# Patient Record
Sex: Male | Born: 1935 | Race: White | Hispanic: No | Marital: Married | Smoking: Former smoker
Health system: Southern US, Community
[De-identification: ages and names within clinical notes are randomized; demographics above are authoritative.]

## PROBLEM LIST (undated history)

## (undated) DIAGNOSIS — M069 Rheumatoid arthritis, unspecified: Secondary | ICD-10-CM

## (undated) DIAGNOSIS — I5030 Unspecified diastolic (congestive) heart failure: Secondary | ICD-10-CM

## (undated) DIAGNOSIS — K7460 Unspecified cirrhosis of liver: Secondary | ICD-10-CM

## (undated) DIAGNOSIS — J189 Pneumonia, unspecified organism: Secondary | ICD-10-CM

## (undated) DIAGNOSIS — K219 Gastro-esophageal reflux disease without esophagitis: Secondary | ICD-10-CM

## (undated) DIAGNOSIS — R0609 Other forms of dyspnea: Secondary | ICD-10-CM

## (undated) DIAGNOSIS — E039 Hypothyroidism, unspecified: Secondary | ICD-10-CM

## (undated) DIAGNOSIS — K429 Umbilical hernia without obstruction or gangrene: Secondary | ICD-10-CM

## (undated) DIAGNOSIS — J982 Interstitial emphysema: Secondary | ICD-10-CM

## (undated) DIAGNOSIS — I10 Essential (primary) hypertension: Secondary | ICD-10-CM

## (undated) DIAGNOSIS — E7800 Pure hypercholesterolemia, unspecified: Secondary | ICD-10-CM

## (undated) DIAGNOSIS — I2510 Atherosclerotic heart disease of native coronary artery without angina pectoris: Secondary | ICD-10-CM

## (undated) DIAGNOSIS — N189 Chronic kidney disease, unspecified: Secondary | ICD-10-CM

## (undated) HISTORY — PX: JOINT REPLACEMENT: SHX530

## (undated) HISTORY — PX: REVISION TOTAL HIP ARTHROPLASTY: SHX766

## (undated) HISTORY — PX: FOOT SURGERY: SHX648

## (undated) HISTORY — PX: TONSILLECTOMY: SUR1361

## (undated) HISTORY — PX: TOTAL HIP ARTHROPLASTY: SHX124

## (undated) HISTORY — PX: CATARACT EXTRACTION W/ INTRAOCULAR LENS  IMPLANT, BILATERAL: SHX1307

## (undated) HISTORY — PX: TOTAL SHOULDER ARTHROPLASTY: SHX126

## (undated) HISTORY — PX: CORONARY ANGIOPLASTY WITH STENT PLACEMENT: SHX49

---
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
## 1998-03-13 ENCOUNTER — Inpatient Hospital Stay (HOSPITAL_COMMUNITY): Admission: EM | Admit: 1998-03-13 | Discharge: 1998-03-15 | Payer: Self-pay | Admitting: Emergency Medicine

## 1998-08-18 HISTORY — PX: TOTAL KNEE ARTHROPLASTY: SHX125

## 1999-03-04 ENCOUNTER — Encounter: Payer: Self-pay | Admitting: Rheumatology

## 1999-03-04 ENCOUNTER — Inpatient Hospital Stay (HOSPITAL_COMMUNITY): Admission: EM | Admit: 1999-03-04 | Discharge: 1999-03-07 | Payer: Self-pay | Admitting: Emergency Medicine

## 1999-03-05 ENCOUNTER — Encounter: Payer: Self-pay | Admitting: Rheumatology

## 1999-03-19 ENCOUNTER — Ambulatory Visit (HOSPITAL_BASED_OUTPATIENT_CLINIC_OR_DEPARTMENT_OTHER): Admission: RE | Admit: 1999-03-19 | Discharge: 1999-03-19 | Payer: Self-pay | Admitting: Orthopaedic Surgery

## 1999-04-04 ENCOUNTER — Ambulatory Visit (HOSPITAL_BASED_OUTPATIENT_CLINIC_OR_DEPARTMENT_OTHER): Admission: RE | Admit: 1999-04-04 | Discharge: 1999-04-04 | Payer: Self-pay | Admitting: Orthopaedic Surgery

## 1999-05-23 ENCOUNTER — Encounter: Payer: Self-pay | Admitting: Orthopaedic Surgery

## 1999-05-28 ENCOUNTER — Inpatient Hospital Stay (HOSPITAL_COMMUNITY): Admission: RE | Admit: 1999-05-28 | Discharge: 1999-05-31 | Payer: Self-pay | Admitting: Orthopaedic Surgery

## 1999-05-31 ENCOUNTER — Inpatient Hospital Stay (HOSPITAL_COMMUNITY)
Admission: RE | Admit: 1999-05-31 | Discharge: 1999-06-04 | Payer: Self-pay | Admitting: Physical Medicine & Rehabilitation

## 1999-06-06 ENCOUNTER — Encounter
Admission: RE | Admit: 1999-06-06 | Discharge: 1999-06-27 | Payer: Self-pay | Admitting: Physical Medicine & Rehabilitation

## 1999-10-10 ENCOUNTER — Inpatient Hospital Stay (HOSPITAL_COMMUNITY): Admission: RE | Admit: 1999-10-10 | Discharge: 1999-10-14 | Payer: Self-pay | Admitting: Orthopaedic Surgery

## 1999-10-13 ENCOUNTER — Encounter: Payer: Self-pay | Admitting: Orthopedic Surgery

## 1999-10-16 ENCOUNTER — Encounter: Admission: RE | Admit: 1999-10-16 | Discharge: 1999-11-07 | Payer: Self-pay | Admitting: Orthopaedic Surgery

## 1999-10-24 ENCOUNTER — Encounter: Payer: Self-pay | Admitting: Orthopaedic Surgery

## 1999-10-24 ENCOUNTER — Inpatient Hospital Stay (HOSPITAL_COMMUNITY): Admission: EM | Admit: 1999-10-24 | Discharge: 1999-10-25 | Payer: Self-pay | Admitting: Emergency Medicine

## 1999-11-19 ENCOUNTER — Encounter: Payer: Self-pay | Admitting: Orthopedic Surgery

## 1999-11-19 ENCOUNTER — Inpatient Hospital Stay (HOSPITAL_COMMUNITY): Admission: EM | Admit: 1999-11-19 | Discharge: 1999-11-20 | Payer: Self-pay | Admitting: Emergency Medicine

## 1999-11-19 ENCOUNTER — Encounter: Payer: Self-pay | Admitting: Emergency Medicine

## 1999-12-19 ENCOUNTER — Inpatient Hospital Stay (HOSPITAL_COMMUNITY): Admission: EM | Admit: 1999-12-19 | Discharge: 1999-12-20 | Payer: Self-pay | Admitting: Emergency Medicine

## 1999-12-19 ENCOUNTER — Encounter: Payer: Self-pay | Admitting: Orthopedic Surgery

## 1999-12-24 ENCOUNTER — Encounter: Payer: Self-pay | Admitting: Orthopaedic Surgery

## 1999-12-24 ENCOUNTER — Inpatient Hospital Stay (HOSPITAL_COMMUNITY): Admission: RE | Admit: 1999-12-24 | Discharge: 1999-12-27 | Payer: Self-pay | Admitting: Orthopaedic Surgery

## 2000-04-13 ENCOUNTER — Inpatient Hospital Stay (HOSPITAL_COMMUNITY): Admission: RE | Admit: 2000-04-13 | Discharge: 2000-04-18 | Payer: Self-pay | Admitting: Orthopaedic Surgery

## 2000-04-20 ENCOUNTER — Emergency Department (HOSPITAL_COMMUNITY): Admission: EM | Admit: 2000-04-20 | Discharge: 2000-04-20 | Payer: Self-pay | Admitting: Emergency Medicine

## 2004-01-23 ENCOUNTER — Ambulatory Visit (HOSPITAL_BASED_OUTPATIENT_CLINIC_OR_DEPARTMENT_OTHER): Admission: RE | Admit: 2004-01-23 | Discharge: 2004-01-23 | Payer: Self-pay | Admitting: Orthopaedic Surgery

## 2004-01-23 ENCOUNTER — Ambulatory Visit (HOSPITAL_COMMUNITY): Admission: RE | Admit: 2004-01-23 | Discharge: 2004-01-23 | Payer: Self-pay | Admitting: Orthopaedic Surgery

## 2005-01-08 ENCOUNTER — Encounter: Admission: RE | Admit: 2005-01-08 | Discharge: 2005-01-08 | Payer: Self-pay | Admitting: Rheumatology

## 2006-04-24 ENCOUNTER — Ambulatory Visit: Payer: Self-pay | Admitting: Oncology

## 2006-07-03 ENCOUNTER — Ambulatory Visit: Payer: Self-pay | Admitting: Oncology

## 2006-07-07 LAB — CBC WITH DIFFERENTIAL/PLATELET
BASO%: 0.7 % (ref 0.0–2.0)
EOS%: 0.4 % (ref 0.0–7.0)
Eosinophils Absolute: 0 10*3/uL (ref 0.0–0.5)
MCH: 29 pg (ref 28.0–33.4)
MCHC: 33 g/dL (ref 32.0–35.9)
MCV: 87.7 fL (ref 81.6–98.0)
MONO%: 4.9 % (ref 0.0–13.0)
NEUT#: 7.5 10*3/uL — ABNORMAL HIGH (ref 1.5–6.5)
RBC: 5.05 10*6/uL (ref 4.20–5.71)
RDW: 13.5 % (ref 11.2–14.6)

## 2006-07-21 ENCOUNTER — Inpatient Hospital Stay (HOSPITAL_BASED_OUTPATIENT_CLINIC_OR_DEPARTMENT_OTHER): Admission: RE | Admit: 2006-07-21 | Discharge: 2006-07-21 | Payer: Self-pay | Admitting: Interventional Cardiology

## 2006-07-23 ENCOUNTER — Inpatient Hospital Stay (HOSPITAL_COMMUNITY): Admission: RE | Admit: 2006-07-23 | Discharge: 2006-07-24 | Payer: Self-pay | Admitting: Interventional Cardiology

## 2006-10-01 ENCOUNTER — Ambulatory Visit: Payer: Self-pay | Admitting: Oncology

## 2006-10-06 LAB — CBC WITH DIFFERENTIAL/PLATELET
Basophils Absolute: 0 10*3/uL (ref 0.0–0.1)
EOS%: 0.9 % (ref 0.0–7.0)
HCT: 42.5 % (ref 38.7–49.9)
HGB: 14.4 g/dL (ref 13.0–17.1)
LYMPH%: 9.1 % — ABNORMAL LOW (ref 14.0–48.0)
MCH: 29.6 pg (ref 28.0–33.4)
MCV: 87.5 fL (ref 81.6–98.0)
MONO%: 8.6 % (ref 0.0–13.0)
NEUT%: 81.3 % — ABNORMAL HIGH (ref 40.0–75.0)
Platelets: 59 10*3/uL — ABNORMAL LOW (ref 145–400)

## 2006-10-20 ENCOUNTER — Encounter (HOSPITAL_COMMUNITY): Payer: Self-pay | Admitting: Oncology

## 2006-10-20 ENCOUNTER — Ambulatory Visit: Payer: Self-pay | Admitting: Oncology

## 2006-10-20 ENCOUNTER — Ambulatory Visit (HOSPITAL_COMMUNITY): Admission: RE | Admit: 2006-10-20 | Discharge: 2006-10-20 | Payer: Self-pay | Admitting: Oncology

## 2006-11-05 LAB — CBC WITH DIFFERENTIAL/PLATELET
EOS%: 1.3 % (ref 0.0–7.0)
LYMPH%: 9.4 % — ABNORMAL LOW (ref 14.0–48.0)
MCH: 29.8 pg (ref 28.0–33.4)
MCV: 85.3 fL (ref 81.6–98.0)
MONO%: 5.6 % (ref 0.0–13.0)
RBC: 4.63 10*6/uL (ref 4.20–5.71)
RDW: 15.2 % — ABNORMAL HIGH (ref 11.2–14.6)

## 2006-11-05 LAB — COMPREHENSIVE METABOLIC PANEL
AST: 44 U/L — ABNORMAL HIGH (ref 0–37)
Albumin: 3.3 g/dL — ABNORMAL LOW (ref 3.5–5.2)
Alkaline Phosphatase: 65 U/L (ref 39–117)
BUN: 23 mg/dL (ref 6–23)
Potassium: 4.2 mEq/L (ref 3.5–5.3)
Sodium: 139 mEq/L (ref 135–145)
Total Bilirubin: 0.6 mg/dL (ref 0.3–1.2)
Total Protein: 6.5 g/dL (ref 6.0–8.3)

## 2006-11-12 LAB — CBC WITH DIFFERENTIAL/PLATELET
Basophils Absolute: 0 10*3/uL (ref 0.0–0.1)
Eosinophils Absolute: 0.1 10*3/uL (ref 0.0–0.5)
HCT: 41.6 % (ref 38.7–49.9)
LYMPH%: 20.3 % (ref 14.0–48.0)
MCV: 85.6 fL (ref 81.6–98.0)
MONO%: 11.1 % (ref 0.0–13.0)
NEUT#: 5.5 10*3/uL (ref 1.5–6.5)
NEUT%: 66.5 % (ref 40.0–75.0)
Platelets: 82 10*3/uL — ABNORMAL LOW (ref 145–400)
RBC: 4.86 10*6/uL (ref 4.20–5.71)

## 2006-11-16 ENCOUNTER — Ambulatory Visit: Payer: Self-pay | Admitting: Oncology

## 2006-11-19 LAB — CBC WITH DIFFERENTIAL/PLATELET
Basophils Absolute: 0.1 10*3/uL (ref 0.0–0.1)
Eosinophils Absolute: 0.1 10*3/uL (ref 0.0–0.5)
HCT: 41.1 % (ref 38.7–49.9)
LYMPH%: 15.5 % (ref 14.0–48.0)
MONO#: 0.9 10*3/uL (ref 0.1–0.9)
NEUT#: 6.7 10*3/uL — ABNORMAL HIGH (ref 1.5–6.5)
NEUT%: 72.4 % (ref 40.0–75.0)
Platelets: 50 10*3/uL — ABNORMAL LOW (ref 145–400)
WBC: 9.3 10*3/uL (ref 4.0–10.0)

## 2006-11-19 LAB — COMPREHENSIVE METABOLIC PANEL
CO2: 27 mEq/L (ref 19–32)
Creatinine, Ser: 1.2 mg/dL (ref 0.40–1.50)
Glucose, Bld: 84 mg/dL (ref 70–99)
Total Bilirubin: 0.7 mg/dL (ref 0.3–1.2)

## 2006-11-19 LAB — LACTATE DEHYDROGENASE: LDH: 310 U/L — ABNORMAL HIGH (ref 94–250)

## 2006-11-26 LAB — CBC WITH DIFFERENTIAL/PLATELET
BASO%: 0.7 % (ref 0.0–2.0)
Basophils Absolute: 0.1 10*3/uL (ref 0.0–0.1)
HCT: 42 % (ref 38.7–49.9)
HGB: 14.2 g/dL (ref 13.0–17.1)
MCHC: 33.8 g/dL (ref 32.0–35.9)
MONO#: 1 10*3/uL — ABNORMAL HIGH (ref 0.1–0.9)
NEUT%: 67 % (ref 40.0–75.0)
RDW: 15.9 % — ABNORMAL HIGH (ref 11.2–14.6)
WBC: 8.8 10*3/uL (ref 4.0–10.0)
lymph#: 1.7 10*3/uL (ref 0.9–3.3)

## 2006-12-03 LAB — CBC WITH DIFFERENTIAL/PLATELET
Basophils Absolute: 0.1 10*3/uL (ref 0.0–0.1)
EOS%: 1.5 % (ref 0.0–7.0)
Eosinophils Absolute: 0.1 10*3/uL (ref 0.0–0.5)
HCT: 38.6 % — ABNORMAL LOW (ref 38.7–49.9)
HGB: 13.1 g/dL (ref 13.0–17.1)
MCH: 29.2 pg (ref 28.0–33.4)
NEUT#: 5.7 10*3/uL (ref 1.5–6.5)
NEUT%: 68.7 % (ref 40.0–75.0)
lymph#: 1.5 10*3/uL (ref 0.9–3.3)

## 2006-12-10 LAB — COMPREHENSIVE METABOLIC PANEL
ALT: 17 U/L (ref 0–53)
AST: 23 U/L (ref 0–37)
CO2: 27 mEq/L (ref 19–32)
Chloride: 102 mEq/L (ref 96–112)
Sodium: 140 mEq/L (ref 135–145)
Total Bilirubin: 0.8 mg/dL (ref 0.3–1.2)
Total Protein: 6.9 g/dL (ref 6.0–8.3)

## 2006-12-10 LAB — CBC WITH DIFFERENTIAL/PLATELET
BASO%: 0.4 % (ref 0.0–2.0)
LYMPH%: 15.9 % (ref 14.0–48.0)
MCHC: 34.5 g/dL (ref 32.0–35.9)
MONO#: 0.8 10*3/uL (ref 0.1–0.9)
RBC: 4.71 10*6/uL (ref 4.20–5.71)
WBC: 8.5 10*3/uL (ref 4.0–10.0)
lymph#: 1.4 10*3/uL (ref 0.9–3.3)

## 2006-12-10 LAB — LACTATE DEHYDROGENASE: LDH: 246 U/L (ref 94–250)

## 2006-12-17 LAB — COMPREHENSIVE METABOLIC PANEL
ALT: 18 U/L (ref 0–53)
AST: 25 U/L (ref 0–37)
Alkaline Phosphatase: 59 U/L (ref 39–117)
BUN: 30 mg/dL — ABNORMAL HIGH (ref 6–23)
Creatinine, Ser: 1.05 mg/dL (ref 0.40–1.50)
Total Bilirubin: 0.7 mg/dL (ref 0.3–1.2)

## 2006-12-17 LAB — CBC WITH DIFFERENTIAL/PLATELET
BASO%: 0.2 % (ref 0.0–2.0)
EOS%: 0.3 % (ref 0.0–7.0)
HCT: 38.6 % — ABNORMAL LOW (ref 38.7–49.9)
LYMPH%: 6.5 % — ABNORMAL LOW (ref 14.0–48.0)
MCH: 28.9 pg (ref 28.0–33.4)
MCHC: 33.7 g/dL (ref 32.0–35.9)
MCV: 85.7 fL (ref 81.6–98.0)
MONO%: 3.3 % (ref 0.0–13.0)
NEUT%: 89.7 % — ABNORMAL HIGH (ref 40.0–75.0)
Platelets: 91 10*3/uL — ABNORMAL LOW (ref 145–400)
lymph#: 0.6 10*3/uL — ABNORMAL LOW (ref 0.9–3.3)

## 2007-01-14 ENCOUNTER — Ambulatory Visit: Payer: Self-pay | Admitting: Oncology

## 2007-02-17 LAB — CBC WITH DIFFERENTIAL/PLATELET
BASO%: 0.3 % (ref 0.0–2.0)
EOS%: 1.4 % (ref 0.0–7.0)
HGB: 12.4 g/dL — ABNORMAL LOW (ref 13.0–17.1)
MCH: 28.3 pg (ref 28.0–33.4)
MCHC: 34.1 g/dL (ref 32.0–35.9)
MONO#: 0.4 10*3/uL (ref 0.1–0.9)
RDW: 16.7 % — ABNORMAL HIGH (ref 11.2–14.6)
WBC: 10.6 10*3/uL — ABNORMAL HIGH (ref 4.0–10.0)
lymph#: 0.8 10*3/uL — ABNORMAL LOW (ref 0.9–3.3)

## 2007-03-18 ENCOUNTER — Ambulatory Visit: Payer: Self-pay | Admitting: Oncology

## 2007-03-22 LAB — CBC WITH DIFFERENTIAL/PLATELET
Basophils Absolute: 0 10*3/uL (ref 0.0–0.1)
Eosinophils Absolute: 0.2 10*3/uL (ref 0.0–0.5)
HCT: 39.3 % (ref 38.7–49.9)
HGB: 13.1 g/dL (ref 13.0–17.1)
MONO#: 0.8 10*3/uL (ref 0.1–0.9)
NEUT#: 5.6 10*3/uL (ref 1.5–6.5)
RDW: 17.4 % — ABNORMAL HIGH (ref 11.2–14.6)
lymph#: 1.4 10*3/uL (ref 0.9–3.3)

## 2007-06-10 ENCOUNTER — Observation Stay (HOSPITAL_COMMUNITY): Admission: EM | Admit: 2007-06-10 | Discharge: 2007-06-11 | Payer: Self-pay | Admitting: Emergency Medicine

## 2007-06-25 ENCOUNTER — Ambulatory Visit: Payer: Self-pay | Admitting: Oncology

## 2007-06-29 LAB — CBC WITH DIFFERENTIAL/PLATELET
Basophils Absolute: 0 10*3/uL (ref 0.0–0.1)
Eosinophils Absolute: 0.2 10*3/uL (ref 0.0–0.5)
HCT: 42.8 % (ref 38.7–49.9)
HGB: 14.5 g/dL (ref 13.0–17.1)
LYMPH%: 16.4 % (ref 14.0–48.0)
MCV: 84.3 fL (ref 81.6–98.0)
MONO%: 9.1 % (ref 0.0–13.0)
NEUT#: 7.2 10*3/uL — ABNORMAL HIGH (ref 1.5–6.5)
NEUT%: 72.2 % (ref 40.0–75.0)
Platelets: 102 10*3/uL — ABNORMAL LOW (ref 145–400)
RDW: 18.5 % — ABNORMAL HIGH (ref 11.2–14.6)

## 2007-06-29 LAB — COMPREHENSIVE METABOLIC PANEL
Albumin: 3.6 g/dL (ref 3.5–5.2)
Alkaline Phosphatase: 79 U/L (ref 39–117)
BUN: 30 mg/dL — ABNORMAL HIGH (ref 6–23)
Glucose, Bld: 81 mg/dL (ref 70–99)
Potassium: 3.8 mEq/L (ref 3.5–5.3)

## 2007-08-26 ENCOUNTER — Inpatient Hospital Stay (HOSPITAL_COMMUNITY): Admission: RE | Admit: 2007-08-26 | Discharge: 2007-08-28 | Payer: Self-pay | Admitting: Orthopaedic Surgery

## 2007-10-26 ENCOUNTER — Ambulatory Visit: Payer: Self-pay | Admitting: Oncology

## 2007-10-28 LAB — CBC WITH DIFFERENTIAL/PLATELET
Basophils Absolute: 0.1 10*3/uL (ref 0.0–0.1)
EOS%: 2.2 % (ref 0.0–7.0)
HGB: 13.9 g/dL (ref 13.0–17.1)
LYMPH%: 13.2 % — ABNORMAL LOW (ref 14.0–48.0)
MCH: 27.9 pg — ABNORMAL LOW (ref 28.0–33.4)
MCV: 83.8 fL (ref 81.6–98.0)
MONO%: 9.4 % (ref 0.0–13.0)
RBC: 4.98 10*6/uL (ref 4.20–5.71)
RDW: 17.6 % — ABNORMAL HIGH (ref 11.2–14.6)

## 2008-02-23 ENCOUNTER — Ambulatory Visit: Payer: Self-pay | Admitting: Oncology

## 2008-02-28 LAB — CBC WITH DIFFERENTIAL/PLATELET
BASO%: 0.4 % (ref 0.0–2.0)
EOS%: 1.9 % (ref 0.0–7.0)
MCH: 28.5 pg (ref 28.0–33.4)
MCHC: 33.5 g/dL (ref 32.0–35.9)
RDW: 17.8 % — ABNORMAL HIGH (ref 11.2–14.6)
lymph#: 1.4 10*3/uL (ref 0.9–3.3)

## 2008-02-28 LAB — COMPREHENSIVE METABOLIC PANEL
ALT: 18 U/L (ref 0–53)
AST: 24 U/L (ref 0–37)
Albumin: 3.5 g/dL (ref 3.5–5.2)
Calcium: 8.5 mg/dL (ref 8.4–10.5)
Chloride: 102 mEq/L (ref 96–112)
Potassium: 4.4 mEq/L (ref 3.5–5.3)

## 2008-06-06 ENCOUNTER — Ambulatory Visit: Payer: Self-pay | Admitting: Oncology

## 2008-06-08 LAB — CBC WITH DIFFERENTIAL/PLATELET
Basophils Absolute: 0 10*3/uL (ref 0.0–0.1)
HGB: 14.9 g/dL (ref 13.0–17.1)
MCH: 29.6 pg (ref 28.0–33.4)
MCHC: 33.2 g/dL (ref 32.0–35.9)
MCV: 89.2 fL (ref 81.6–98.0)
MONO#: 0.6 10*3/uL (ref 0.1–0.9)
MONO%: 8.8 % (ref 0.0–13.0)
RDW: 15.5 % — ABNORMAL HIGH (ref 11.2–14.6)
lymph#: 1.5 10*3/uL (ref 0.9–3.3)

## 2008-08-25 ENCOUNTER — Ambulatory Visit: Payer: Self-pay | Admitting: Oncology

## 2008-08-29 LAB — CBC WITH DIFFERENTIAL/PLATELET
BASO%: 0.5 % (ref 0.0–2.0)
EOS%: 1.7 % (ref 0.0–7.0)
HGB: 14.4 g/dL (ref 13.0–17.1)
LYMPH%: 17 % (ref 14.0–48.0)
MCH: 28.5 pg (ref 28.0–33.4)
MCHC: 33.1 g/dL (ref 32.0–35.9)
MONO#: 0.7 10*3/uL (ref 0.1–0.9)
NEUT%: 72.3 % (ref 40.0–75.0)
Platelets: 126 10*3/uL — ABNORMAL LOW (ref 145–400)
RDW: 15.9 % — ABNORMAL HIGH (ref 11.2–14.6)
lymph#: 1.5 10*3/uL (ref 0.9–3.3)

## 2008-08-29 LAB — COMPREHENSIVE METABOLIC PANEL
ALT: 27 U/L (ref 0–53)
Alkaline Phosphatase: 61 U/L (ref 39–117)
BUN: 26 mg/dL — ABNORMAL HIGH (ref 6–23)
CO2: 26 mEq/L (ref 19–32)
Creatinine, Ser: 1.3 mg/dL (ref 0.40–1.50)
Glucose, Bld: 81 mg/dL (ref 70–99)
Total Protein: 7.1 g/dL (ref 6.0–8.3)

## 2008-11-27 ENCOUNTER — Ambulatory Visit: Payer: Self-pay | Admitting: Oncology

## 2008-11-29 LAB — CBC WITH DIFFERENTIAL/PLATELET
Eosinophils Absolute: 0 10*3/uL (ref 0.0–0.5)
HCT: 45.2 % (ref 38.4–49.9)
MCHC: 33.2 g/dL (ref 32.0–36.0)
MCV: 86.5 fL (ref 79.3–98.0)
MONO#: 0.4 10*3/uL (ref 0.1–0.9)
NEUT#: 7.7 10*3/uL — ABNORMAL HIGH (ref 1.5–6.5)
RBC: 5.22 10*6/uL (ref 4.20–5.82)
RDW: 16.2 % — ABNORMAL HIGH (ref 11.0–14.6)

## 2008-12-11 ENCOUNTER — Encounter: Admission: RE | Admit: 2008-12-11 | Discharge: 2008-12-11 | Payer: Self-pay | Admitting: Orthopaedic Surgery

## 2008-12-12 ENCOUNTER — Ambulatory Visit (HOSPITAL_BASED_OUTPATIENT_CLINIC_OR_DEPARTMENT_OTHER): Admission: RE | Admit: 2008-12-12 | Discharge: 2008-12-12 | Payer: Self-pay | Admitting: Orthopaedic Surgery

## 2009-03-01 ENCOUNTER — Ambulatory Visit: Payer: Self-pay | Admitting: Oncology

## 2009-03-05 LAB — CBC WITH DIFFERENTIAL/PLATELET
BASO%: 0.4 % (ref 0.0–2.0)
Basophils Absolute: 0 10*3/uL (ref 0.0–0.1)
EOS%: 1.4 % (ref 0.0–7.0)
Eosinophils Absolute: 0.1 10*3/uL (ref 0.0–0.5)
LYMPH%: 21 % (ref 14.0–49.0)
MCH: 29.8 pg (ref 27.2–33.4)
MONO#: 0.7 10*3/uL (ref 0.1–0.9)
MONO%: 8.9 % (ref 0.0–14.0)
NEUT#: 5.5 10*3/uL (ref 1.5–6.5)
WBC: 8 10*3/uL (ref 4.0–10.3)

## 2009-03-05 LAB — COMPREHENSIVE METABOLIC PANEL
ALT: 14 U/L (ref 0–53)
AST: 21 U/L (ref 0–37)
Albumin: 3.6 g/dL (ref 3.5–5.2)
BUN: 23 mg/dL (ref 6–23)
Calcium: 9.4 mg/dL (ref 8.4–10.5)
Creatinine, Ser: 1.3 mg/dL (ref 0.40–1.50)
Potassium: 3.6 mEq/L (ref 3.5–5.3)
Total Protein: 6.9 g/dL (ref 6.0–8.3)

## 2009-03-27 ENCOUNTER — Ambulatory Visit (HOSPITAL_BASED_OUTPATIENT_CLINIC_OR_DEPARTMENT_OTHER): Admission: RE | Admit: 2009-03-27 | Discharge: 2009-03-27 | Payer: Self-pay | Admitting: Orthopaedic Surgery

## 2009-07-25 ENCOUNTER — Inpatient Hospital Stay (HOSPITAL_BASED_OUTPATIENT_CLINIC_OR_DEPARTMENT_OTHER): Admission: RE | Admit: 2009-07-25 | Discharge: 2009-07-25 | Payer: Self-pay | Admitting: Interventional Cardiology

## 2009-08-02 ENCOUNTER — Inpatient Hospital Stay (HOSPITAL_COMMUNITY): Admission: RE | Admit: 2009-08-02 | Discharge: 2009-08-03 | Payer: Self-pay | Admitting: Interventional Cardiology

## 2009-09-05 ENCOUNTER — Ambulatory Visit: Payer: Self-pay | Admitting: Oncology

## 2009-09-06 ENCOUNTER — Encounter (HOSPITAL_COMMUNITY): Admission: RE | Admit: 2009-09-06 | Discharge: 2009-12-05 | Payer: Self-pay | Admitting: Interventional Cardiology

## 2009-09-07 LAB — CBC WITH DIFFERENTIAL/PLATELET
BASO%: 0.3 % (ref 0.0–2.0)
Basophils Absolute: 0 10*3/uL (ref 0.0–0.1)
EOS%: 0.5 % (ref 0.0–7.0)
HCT: 39.1 % (ref 38.4–49.9)
HGB: 13 g/dL (ref 13.0–17.1)
MCH: 29.8 pg (ref 27.2–33.4)
MCV: 89.5 fL (ref 79.3–98.0)
RBC: 4.37 10*6/uL (ref 4.20–5.82)
lymph#: 0.5 10*3/uL — ABNORMAL LOW (ref 0.9–3.3)

## 2010-03-19 ENCOUNTER — Ambulatory Visit (HOSPITAL_BASED_OUTPATIENT_CLINIC_OR_DEPARTMENT_OTHER): Payer: Medicare Other | Admitting: Oncology

## 2010-03-21 LAB — CBC WITH DIFFERENTIAL/PLATELET
BASO%: 0.6 % (ref 0.0–2.0)
HCT: 39 % (ref 38.4–49.9)
MCH: 29.2 pg (ref 27.2–33.4)
MCHC: 33.1 g/dL (ref 32.0–36.0)
MONO#: 0.8 10*3/uL (ref 0.1–0.9)
NEUT#: 4.8 10*3/uL (ref 1.5–6.5)
NEUT%: 63.5 % (ref 39.0–75.0)
Platelets: 189 10*3/uL (ref 140–400)
RBC: 4.42 10*6/uL (ref 4.20–5.82)
WBC: 7.6 10*3/uL (ref 4.0–10.3)
lymph#: 1.8 10*3/uL (ref 0.9–3.3)

## 2010-09-20 ENCOUNTER — Encounter: Payer: Medicare Other | Admitting: Oncology

## 2010-09-20 DIAGNOSIS — M069 Rheumatoid arthritis, unspecified: Secondary | ICD-10-CM

## 2010-09-20 DIAGNOSIS — D696 Thrombocytopenia, unspecified: Secondary | ICD-10-CM

## 2010-09-20 DIAGNOSIS — Z7982 Long term (current) use of aspirin: Secondary | ICD-10-CM

## 2010-09-20 LAB — CBC WITH DIFFERENTIAL/PLATELET
LYMPH%: 19.3 % (ref 14.0–49.0)
MCHC: 32.8 g/dL (ref 32.0–36.0)
MCV: 86.9 fL (ref 79.3–98.0)
MONO#: 0.9 10*3/uL (ref 0.1–0.9)
NEUT#: 5 10*3/uL (ref 1.5–6.5)
Platelets: 168 10*3/uL (ref 140–400)
RDW: 16.7 % — ABNORMAL HIGH (ref 11.0–14.6)

## 2010-11-18 LAB — BASIC METABOLIC PANEL
CO2: 26 mEq/L (ref 19–32)
Calcium: 8.5 mg/dL (ref 8.4–10.5)
Chloride: 108 mEq/L (ref 96–112)
Creatinine, Ser: 1.05 mg/dL (ref 0.4–1.5)
GFR calc Af Amer: >60 mL/min (ref >60)
GFR calc non Af Amer: >60 mL/min (ref >60)
Glucose, Bld: 92 mg/dL (ref 70–99)
Potassium: 3.6 mEq/L (ref 3.5–5.1)

## 2010-11-18 LAB — CBC: RDW: 16.3 % — ABNORMAL HIGH (ref 11.5–15.5)

## 2010-11-24 LAB — BASIC METABOLIC PANEL
Calcium: 9.6 mg/dL (ref 8.4–10.5)
Creatinine, Ser: 1.38 mg/dL (ref 0.4–1.5)
Glucose, Bld: 91 mg/dL (ref 70–99)
Sodium: 139 mEq/L (ref 135–145)

## 2010-11-24 LAB — POCT HEMOGLOBIN-HEMACUE: Hemoglobin: 14.5 g/dL (ref 13.0–17.0)

## 2010-11-27 LAB — BASIC METABOLIC PANEL
CO2: 30 mEq/L (ref 19–32)
Chloride: 100 mEq/L (ref 96–112)
GFR calc non Af Amer: 54 mL/min — ABNORMAL LOW (ref >60)
Sodium: 138 mEq/L (ref 135–145)

## 2010-11-27 LAB — POCT HEMOGLOBIN-HEMACUE: Hemoglobin: 15.4 g/dL (ref 13.0–17.0)

## 2010-12-31 NOTE — Op Note (Signed)
[name], [name] [name]              ACCOUNT NO.:  [id number]   MEDICAL RECORD NO.:  [id number]          PATIENT TYPE:  AMB   LOCATION:  DSC                          FACILITY:  [facility]   PHYSICIAN:  Lubertha Basque. Dalldorf, M.D.DATE OF BIRTH:  1935/02/02   DATE OF PROCEDURE:  03/27/2009  DATE OF DISCHARGE:                               OPERATIVE REPORT   PREOPERATIVE DIAGNOSIS:  Left foot metatarsalgia.   POSTOPERATIVE DIAGNOSIS:  Left foot metatarsalgia.   PROCEDURE:  Excision, left foot metatarsal heads 2, 3, 4, and 5.   ANESTHESIA:  General.   ATTENDING SURGEON:  Lubertha Basque. Jerl Santos, MD   ASSISTANT:  Lindwood Qua, PA   INDICATIONS FOR PROCEDURE:  The patient is a 76 year old man with  rheumatoid arthritis.  He has had many orthopedic interventions.  He has  significant pain on his left forefoot related to prominent metatarsal  heads.  He has a history of plantar aspect wounds.  On his opposite  foot, he is status post a couple of the orthopedic procedures, which  eventually led to excision of all of his 2 through 5 metatarsal heads.  He is offered same procedure on the left in one sitting at this point.  Informed operative consent was obtained after discussion of possible  complications including reaction to anesthesia, infection, and continued  pain.   SUMMARY OF FINDINGS AND PROCEDURE:  Under general anesthesia through 2  dorsal incisions, we removed the metatarsal heads 2 through 5.  This was  done with an oscillating saw at the level of metatarsal neck at each  bone.   DESCRIPTION OF PROCEDURE:  The patient was taken to the operating suite  where a general anesthetic was applied without difficulty.  He was  positioned supine and prepped and draped in normal sterile fashion.  After administration of IV Kefzol, the left leg was elevated,  exsanguinated, and tourniquet inflated about the calf.  We made 2 dorsal  incisions.  One incision was between the metatarsal heads 2 and 3  and  the other was between 4 and 5.  Dissection was carried down to each  metatarsal neck area.  I used an oscillating saw to make a slightly  beveled cut and removed the metatarsal head at each location bluntly  with scissors.  Both the wounds were thoroughly irrigated.  The  tourniquet was deflated, and his toes became pink and warm immediately.  A mild amount of bleeding was easily controlled with pressure and Bovie  cautery.  The wounds were again irrigated followed by reapproximation of  skin loosely with nylon.  Adaptic was applied followed by dry gauze and  a bulky dressing with a loose Ace wrap.  Estimated blood loss and  intraoperative fluids as well as accurate tourniquet time can be  obtained from anesthesia records.   DISPOSITION:  The patient was extubated in the operating room and taken  to recovery room in stable addition.  He was to go home the same day and  follow up in the office in less than a week.  I will contact him by  phone tonight.  Lubertha Basque Jerl Santos, M.D.  Electronically Signed     PGD/MEDQ  D:  03/27/2009  T:  03/27/2009  Job:  [id number]

## 2010-12-31 NOTE — Op Note (Signed)
[name], [name]              ACCOUNT NO.:  [id number]   MEDICAL RECORD NO.:  [id number]          PATIENT TYPE:  INP   LOCATION:  5013                         FACILITY:  [facility]   PHYSICIAN:  Lubertha Basque. Dalldorf, M.D.DATE OF BIRTH:  1935/02/09   DATE OF PROCEDURE:  08/26/2007  DATE OF DISCHARGE:                               OPERATIVE REPORT   PREOPERATIVE DIAGNOSIS:  Loose right total hip replacement.   POSTOPERATIVE DIAGNOSIS:  Loose right total hip replacement.   PROCEDURE:  Revision right total hip replacement.   ANESTHESIA:  General.   ATTENDING SURGEON:  Lubertha Basque. Jerl Santos, M.D.   ASSISTANT:  Lindwood Qua, P.A.-C.   INDICATIONS FOR PROCEDURE:  The patient is a 76 year old man who is  about ten years from a hip replacement which was complicated by chronic  dislocations.  He is about eight years from placement of a constrained  liner.  Unfortunately about a month ago, he suffered his first recurrent  dislocation.  He underwent a closed reduction but the locking ring of  this mechanism is seen to be loose.  He is offered revision in hopes of  placing another constrained liner.  Informed operative consent was  obtained after a discussion of possible complications of reaction to  anesthesia, infection, neurovascular injury, and recurrent dislocations.   SUMMARY OF FINDINGS AND PROCEDURE:  Under general anesthesia through his  old posterior approach, a revision of his right total hip replacement  was performed.  He had a great deal of benign appearing synovial fluid  which we evacuated from the hip.  This was sent to pathology for STAT  gram stain which was benign.  He had a loose medal ring in the hip joint  which was removed.  His liner was significantly worn and was removed.  We then replaced this with a new [employer] 50 x 22 constrained liner  with a new 22 plus 0 hip ball.  Bryna Colander assisted throughout and  was invaluable to the completion of the case in that he  helped position  and retract while I performed the procedure.  He also closed  simultaneously to help minimize OR time.   DESCRIPTION OF PROCEDURE:  The patient was taken to the operating suite  where a general anesthetic was applied without difficulty.  He was  positioned in the lateral decubitus position with the right hip up.  Hip  positioners were utilized along with an axillary roll.  All bony  prominences were appropriately padded.  He was then prepped and draped  in normal sterile fashion.  After the administration of IV Kefzol, his  old incision was utilized with a posterior approach taken to the right  hip.  Dissection was carried down through a paucity of adipose tissue to  expose the IT band and gluteus maximus fascia which were released  longitudinally.  He really had little if any posterior capsule or  external rotators remaining.  A large amount of fluid was drained from  his hip and, as mentioned above, this was sent to pathology for STAT  gram stain which was benign.  We did  also send cultures which are  obviously pending.  He had a great deal of black synovitis which was  addressed with a thorough synovectomy.  Despite a platelet count of only  60,000, he did not bleed terribly.  I removed a loose metal ring from  the hip joint which obviously came from his constrained liner.  Then,  with some moderate difficulty, we were able to remove the polyethylene  portion of the liner.  The acetabular and femoral components seemed to  be stable.  We obtained a new [employer] 50 x 22 constrained liner placed  this in appropriate rotation with the built up aspect being posterior.  This was then seated fully.  We then placed a new 22 plus 0 hip ball on  the femoral neck as we removed the old one as part of our exposure.  This was then placed into the constrained liner bipolar assembly and  clicked into place.  The hip knee ranged well and was stable.  The wound  was thoroughly  irrigated followed by reapproximation of IT band and  gluteus maximus fascia in interrupted fashion with #1 Vicryl.  Subcutaneous tissues were reapproximated with 0 Vicryl in an interrupted  fashion followed by skin closure with staples.  Adaptic was applied  followed by dry gauze and tape.  Estimated blood loss was 200 mL and  intraoperative fluids can be obtained from anesthesia records.   DISPOSITION:  The patient was extubated in the operating room and taken  to the recovery room in stable addition.  He was to be admitted to the  orthopedic surgery service for appropriate postop care to include  perioperative antibiotics and Coumadin for DVT prophylaxis.  We have  elected to forego Lovenox with his significantly low platelet count.  We  are going to hold his Plavix for one day at which point he can resume  that medication, but we are going to hold aspirin while he is on  Coumadin.      Lubertha Basque Jerl Santos, M.D.  Electronically Signed     PGD/MEDQ  D:  08/26/2007  T:  08/26/2007  Job:  [id number]

## 2010-12-31 NOTE — Op Note (Signed)
[name] [name], [name] [name]              ACCOUNT NO.:  [id number]   MEDICAL RECORD NO.:  [id number]          PATIENT TYPE:  INP   LOCATION:  4540                         FACILITY:  [facility]   PHYSICIAN:  Harvie Junior, M.D.   DATE OF BIRTH:  01-14-1935   DATE OF PROCEDURE:  06/10/2007  DATE OF DISCHARGE:                               OPERATIVE REPORT   PREOPERATIVE DIAGNOSIS:  Dislocated right hip.   POSTOPERATIVE DIAGNOSIS:  Dislocated right hip.   PRINCIPLE PROCEDURE:  Right total hip closed reduction.   SURGEON:  Harvie Junior, M.D.   ASSISTANT:  Marshia Ly, P.A.   ANESTHESIA:  General.   BRIEF HISTORY:  Mr. Hislop is a 76 year old male with a long history of  having had a previous tripolar constrained liner Oste[employer]ics hip  replacement after some dislocations after a total hip.  He has done well  for about six years and was in the yard today working and felt the hip  pop out.  He came to the emergency room.  There was an old broken ring,  and the hip was out of socket.  We ran the x-rays by my partner, Dr.  Turner Daniels, who had actually put it in, and he was concerned that this was a  catastrophic failure and we would not be able to get some kind of  reduction.  We pulled an article that showed some different methods of  failure of this implant, and it certainly seemed like there was a  possibility that really the ring we were seeing was the external ring,  and that the plastic may still be in the socket.  Given all of the  issues and need for having to go out of town to get implants and all of  this kind of thing, we certainly felt it was important to attempt at a  closed reduction because the patient is on Imbrel and all of these other  issues related to being able to delay surgery being better for him.  At  any rate, I discussed this with the patient, I discussed this with his  wife, and I think they felt it was a reasonable thing to try, so we  brought him to the operating room.   We were not prepared to do an open  reduction if the closed reduction failed.   PROCEDURE:  Patient brought to the operating room.  After adequate  anesthesia was obtained with general anesthetic, the patient was placed  on the operating room table, and 100% muscle relaxation was then given.  At this point, manipulative closed reduction was undertaken multiple  times, really not successful.  Ultimately got a fluoro in, and we could  see where the hip was posterior, that it was superior.  Ultimately, we  just pulled harder at putting an abduction portion in.  We were able to  kind of get him out.  It felt like the hip perched for a minute and then  kind of popped in, which is really what you would expect if the plastic  was still in the constrained liner.  We then  put him through a  significant range of motion.  We internally rotated and flexed him to 90  and adducted him and could not get him out.  Abduction figure 4 with  pressure could not get him out the front, so we really felt like he was  pretty stable.  At that point, we put him in an abduction brace, took  fluoro images AP and laterally, which showed him in.  Ultimately, we are going to treat him in an abduction arthrosis and then  give discussion about revision versus trying to treat him with just that  brace.  Any way, he will be overnight for observation and fitting of the  brace.  Estimated blood loss for the procedure was none.  Complications  were none.      Harvie Junior, M.D.  Electronically Signed     JLG/MEDQ  D:  06/10/2007  T:  06/11/2007  Job:  [id number]

## 2010-12-31 NOTE — Op Note (Signed)
NAMEIZEAH, VOSSLER              ACCOUNT NO.:  [id number]   MEDICAL RECORD NO.:  [id number]          PATIENT TYPE:  AMB   LOCATION:  DSC                          FACILITY:  [facility]   PHYSICIAN:  Lubertha Basque. Dalldorf, M.D.DATE OF BIRTH:  10-19-1934   DATE OF PROCEDURE:  12/12/2008  DATE OF DISCHARGE:                               OPERATIVE REPORT   PREOPERATIVE DIAGNOSIS:  Rheumatoid arthritis with metatarsalgia.   POSTOPERATIVE DIAGNOSIS:  Rheumatoid arthritis with metatarsalgia.   PROCEDURES:  1. Right fourth metatarsal head excision.  2. Right fifth metatarsal head excision.   ANESTHESIA:  General.   ATTENDING SURGEON:  Lubertha Basque. Jerl Santos, MD   ASSISTANT:  Lindwood Qua, PA   INDICATIONS FOR PROCEDURE:  The patient is a 76 year old man with a long  history of rheumatoid arthritis and related orthopedic problems.  He has  had problems with his right foot for years.  He is status post excision  of his second and third metatarsal heads for a plantar wound which  subsequently healed well many years ago.  Unfortunately over the past  year, he has been beset with trouble with a wound under the fourth  metatarsal head and a painful callus near the fifth metatarsal head.  This has persisted despite shoe modifications.  He was offered excision  of the fourth and fifth metatarsal heads at this point.  Informed  operative consent was obtained after discussion of possible  complications including reaction to anesthesia and infection and poor  healing of skin.   SUMMARY OF FINDINGS AND PROCEDURE:  Under general anesthesia through two  separate incisions the fourth and fifth metatarsal heads were excised.  I used fluoroscopy throughout the case to make appropriate  intraoperative decisions and read all these views myself.  He was closed  primarily and placed in a soft dressing.   DESCRIPTION OF PROCEDURE:  The patient was taken to the operating suite  where general anesthetic was  applied without difficulty.  He was  positioned supine and prepped and draped in normal sterile fashion.  After administration of IV Kefzol, the right leg was elevated,  exsanguinated, and tourniquet inflated about the calf.  A dorsal  incision was made over the fifth metatarsal neck with dissection down to  the structure retracting the extensor tendons out of harm's way.  An  oscillating saw was used to make a cut just below the metatarsal neck  and the metatarsal head was removed without much difficulty.  A second  incision was then made with about a 2.5 cm skin bridge.  Dissection here  was carried down to the fourth metatarsal and once this was exposed the  metatarsal neck cut was made with an oscillating saw and the head was  removed without much difficulty.  Fluoroscopy was used to confirm  adequate level of resection at both sites.  The tourniquet was deflated  and the skin edges became pink and warm immediately.  He had a mild  amount of bleeding easily controlled with Bovie cautery and some  pressure.  Both wounds were thoroughly irrigated followed by  reapproximation of skin  with nylon.  Some Marcaine was injected about  the skin edges with no epinephrine included.  Adaptic was applied  followed by dry gauze and a loose Ace wrap.  Estimated blood loss and  intraoperative fluids obtained from anesthesia records as can accurate  tourniquet time.   DISPOSITION:  The patient was extubated in the operating room and taken  to recovery room in stable addition.  He was to go home the same day and  follow up in the office next week.  I will contact him by phone tonight.      Lubertha Basque Jerl Santos, M.D.  Electronically Signed     PGD/MEDQ  D:  12/12/2008  T:  12/12/2008  Job:  [id number]

## 2011-01-03 NOTE — Consult Note (Signed)
[place]  Patient:    [name] [name], [name] [name]                          MRN: [id number] Proc. Date: 04/20/00 Adm. Date:  [id number] Attending:  Osvaldo Human                          Consultation Report  REASON FOR CONSULTATION:  A 76 year old gentleman with weakness.  HISTORY OF PRESENT ILLNESS:  [name] [name] is a pleasant 76 year old man with a long history of rheumatoid arthritis.  He is chronically on between 10 and 5 mg daily.  He was admitted last week for an elective left hip replacement.  Apparently, inadvertently, his prednisone was stopped perioperatively and was not restarted until April 19, 2000.  He called from home today stating that he felt weak and had mild nausea with no abdominal pain.  With his history of abrupt cessation of steroids and longstanding prednisone, he was encouraged to come to the emergency room for further evaluation.  He does report that he felt worse yesterday than today.  He has had no fevers or chills.  He has noticed a rash on his back since April 16, 2000 which is pruritic.  Wife has been applying topical hydrocortisone and this has helped considerably.  It was stopped prior to discharge and this was due to a contact dermatitis.  ALLERGIES:  PENICILLIN and ASPIRIN.  CURRENT MEDICATIONS: 1. Synthroid 75 mcg q.d. 2. Prednisone usually 5 mg q.d. 3. Darvocet-N 100 1 p.o. 4. Methotrexate 2.5 mg 3 tablets every Monday. 5. Folic acid. 6. Hydroxychloroquine 200 mg b.i.d.  PAST MEDICAL HISTORY:  Rheumatoid arthritis.  Hypothyroidism.  No hypertension and no diabetes.  PHYSICAL EXAMINATION:  GENERAL:  A pleasant man in no acute distress.  VITAL SIGNS:  He is afebrile.  His blood pressure is 155/91, temperature 97.1.  HEENT:  Oropharynx is clear.  NECK:  Supple.  There is no lymphadenopathy.  LUNGS:  Clear bilaterally.  HEART:  Regular rhythm and rate without murmur.  SKIN:  Splotchy macular papular  rash over his back and buttock.  LABORATORY DATA:  BMET was done and is normal, although it was slightly hemolyzed.  I rechecked a I-stat with an NA of 138, K of 3.5, glucose 117.  ASSESSMENT: 74. A 76 year old man chronically dependent on prednisone, status post sudden    withdrawal of his daily dose along with this stress of surgery.  This lead    me to concern about possible adrenal insufficiency.  His blood pressure is    good and his electrolytes are normal.  He is feeling better.  To be    cautious, I am discharging him on prednisone 40 mg x 2 days, followed by 30    mg x 2 days, followed by 20 mg q.d. x 2 days, followed by 20 mg q.d. x 2    days, and then 5 mg q.d.  He is to call the office with an update on how he    is feeling in the morning. 2. Rash, questionable contact dermatitis.  This should also improve with the    above steroids.  He is to call if he has any further problems. DD:  04/20/00 TD:  04/20/00 Job: [id number]

## 2011-01-03 NOTE — H&P (Signed)
[place]. [facility]  Patient:    [name] [name], [name] [name]                       MRN: [id number] Adm. Date:  [id number] Disc. Date: [id number] Attending:  Alinda Deem                         History and Physical  CHIEF COMPLAINT:  Third posterior dislocation of right Hybrid total hip.  HISTORY OF PRESENT ILLNESS:  This 76 year old gentleman with rheumatoid arthritis that is moderate to severe, underwent a Hybrid right total hip arthroplasty by r. 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  His wife then drove him to the [facility] Emergency Room where I evaluated him.  Plain radiographs showed the posterior dislocation and e was prepared for a closed reduction under general anesthesia.  He had remained neurovascularly intact, normal pulses to the feet.  He has the usual stigmata of rheumatoid arthritis with the ulnar deviation of the fingers and the fibular deviation of the toes.  PAST MEDICAL HISTORY:  Please refer to the last three history and physicals for  this, but to recap, he takes prednisone 7 mg p.o. q.d., folate 1 mg p.o. q.d., methotrexate 7.5 mg p.o. q.d., Darvocet-N 100 one to two p.o. q.4h. p.r.n.  The  rest of his review of systems is unchanged.  PHYSICAL EXAMINATION:  GENERAL:  Well-nourished, well-developed, 76 year old gentleman with usual stigmata of rheumatoid arthritis.  His right lower extremity is an inch and a half shortened, internally rotated, consistent with a posterior dislocation.  LUNGS:  Clear.  HEART:  Regular rate and rhythm.  ABDOMEN:  Belly is soft and nontender.  BACK:  The back has some kyphotic deformity consistent with rheumatoid arthritis.  EXTREMITIES:  Pain with any attempt of range of motion of the right hip.  His bilateral total knees have no effusion and no  ecchymoses.  The feet have usual stigmata of rheumatoid arthritis.  He has normal pulses to the feet, normal sensation to the toes, moves his feet up and down, and the sciatic nerve is intact.  LABORATORY DATA:  At time of admission the plain x-rays showed a posterior dislocation of his right total hip.  Other lab data was pending.  ASSESSMENT:  Third dislocation of right total hip.  PLAN:  Closed reduction under general mask anesthesia.  We will also contact Aliene Altes, the [employer] representative to have the constraint liners and components flown in so that he may be revised to a constrained liner at Dr. Hurman Horn convenience. DD:  12/19/99 TD:  12/19/99 Job: [id number]

## 2011-01-03 NOTE — Procedures (Signed)
[place]  Patient:    [name] [name], [name] [name]                       MRN: [id number] Proc. Date: 10/10/99 Adm. Date:  [id number] Attending:  Marcene Corning CC:         Burna Forts, M.D.             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  OPERATIVE PROCEDURE:  Right total hip replacement performed by Dr. Lubertha Basque. Dalldorf.  ANESTHESIA PROCEDURE:  Placement of lumbar epidural catheter for postoperative analgesia.  INDICATIONS:  Preoperatively, the risks and benefits of placement of the epidural catheter for postoperative analgesia were discussed with the patient. Parenthetically, the patient had had an epidural previously for recent bilateral knee replacements which had worked well for him and he consented to the placement at this time for his operative procedure.  This again had been requested by his  attending orthopedic surgeon, Dr. Jerl Santos.  DESCRIPTION OF PROCEDURE:  The patient was allowed to remain in the left lateral decubitus position.  A sterile prep of the lumbar area was conducted.  Using a #17-gauge Tuohy needle adjacent to the L2-3 interspace, the epidural space was contacted with a loss-of-resistance technique and a catheter threaded approximately 2 to 3 cm beyond the needle tip and the needle was removed.  After negative aspiration for both heme and CSF, the catheter was injected with a total of 7 cc of 0.25% Marcaine containing 100 mcg of Fentanyl.  The catheter was secured in place with tape, patient turned supine, extubated and transferred to the PACU in stable condition.  COMPLICATIONS:  None.  DISPOSITION:  This patient will be followed daily by Department of Anesthesiology for his postoperative analgesia via epidural catheter.  DD:  10/10/99 TD:  10/11/99 Job: [id number]

## 2011-01-03 NOTE — Cardiovascular Report (Signed)
NAMEPAVEL, [name] [name]              ACCOUNT NO.:  [id number]   MEDICAL RECORD NO.:  [id number]          PATIENT TYPE:  INP   LOCATION:  6525                         FACILITY:  [facility]   PHYSICIAN:  Corky Crafts, MDDATE OF BIRTH:  Dec 16, 1934   DATE OF PROCEDURE:  07/23/2006  DATE OF DISCHARGE:                            CARDIAC CATHETERIZATION   PROCEDURES PERFORMED:  PCI of the left anterior descending and PCI of  the OM-1.   INDICATIONS:  Stable angina.   OPERATORS:  Dr. Eldridge Dace.   PROCEDURAL NARRATIVE:  The risks and benefits of PCI were explained to  the patient and informed consent was obtained.  The patient was brought  to the cath lab and placed on the table.  His prepped and draped the  usual sterile fashion.  His left groin was infiltrated with 1%  lidocaine.  A 6-French arterial sheath was placed into his left femoral  artery using the modified Seldinger technique.  Diagnostic angiography  had revealed a 95% diffuse long LAD stenosis which was calcified.  There  is also an 80% ostial second diagonal.  There is a 70% OM-1.  The left  main coronary artery was intubated with a CLS for Guidant catheter.  A  Prowater wire was placed down the LAD across the severe stenosis.  A BMW  wire was placed down the second diagonal.  A 2.0 x 20 Maverick balloon  was inflated to 10 atmospheres for 15 seconds across the stenosis in the  LAD.  The patient did have chest pain with this and prior to the balloon  inflation just with 2 wires down to the LAD, he had sluggish flow down  the vessel.  The patient also had chest pain.  The balloon was inflated  to 10 atmospheres for 15 seconds and then 12 atmospheres for 16 seconds.  A 2.75 x 32 Liberte stent was then placed across the lesion and deployed  at 10 atmospheres for 40 seconds.  This long stent did cover the entire  segment of disease.  A 2.75 x 20 mm Quantum Maverick was then deployed  at 18 atmospheres for 46 seconds in the  distal part of the stent, for 21  seconds in the mid part of the stent and then for 26 seconds at the  proximal edge of the stent.  There was excellent flow.  The flow through  the diagonal vessels remained patent.  There is TIMI III flow down the  LAD.  There is no residual stenosis.  The wires were then removed.  The  BMW wire was then placed down the OM-1.  The prowater wqas used as a  buddy wire.  An Express monorail stent was then placed across the  lesion.  It was a 2.5 x 12 mm stent.  It was deployed at 14 atmospheres  for 52 seconds.  There was excellent flow with no residual stenosis.   IMPRESSION:  Successful 2-vessel PCI of the LAD and OM-1 with a bare  metal stents.  Bare metal stents were chosen so that the patient can  have left shoulder surgery for which  he is planning on early next year.   RECOMMENDATIONS:  1. The patient needs to continue aspirin 325 mg daily and Plavix 75 mg      daily for at least 30 days.  He will be monitored overnight.  A      StarClose was deployed to his left groin for hemostasis.  2. I stressed the importance of the patient following up on his      orthopedic surgery soon.  Ideally, he would have any type of      operation in late January after he is off his Plavix.  I explained      to him that there is a risk of      restenosis with these bare metal stents, especially given how long      a stent he has in his LAD.  If he does re-stenose the bare metal      stent, he would have to have a drug-eluting stent placed and this      would commit him to Plavix indefinitely; any type of orthopedic      surgery would be difficult at that time.      Corky Crafts, MD  Electronically Signed     JSV/MEDQ  D:  07/23/2006  T:  07/23/2006  Job:  [id number]   cc:   Demetria Pore. Coral Spikes, M.D.  Lubertha Basque Jerl Santos, M.D.

## 2011-01-03 NOTE — Discharge Summary (Signed)
[name] [name], [name] [name]              ACCOUNT NO.:  [id number]   MEDICAL RECORD NO.:  [id number]          PATIENT TYPE:  INP   LOCATION:  5013                         FACILITY:  [facility]   PHYSICIAN:  Lubertha Basque. Dalldorf, M.D.DATE OF BIRTH:  05-30-1935   DATE OF ADMISSION:  08/26/2007  DATE OF DISCHARGE:  08/28/2007                               DISCHARGE SUMMARY   ADMISSION DIAGNOSES:  1. Right hip total hip replacement failed.  2. Hypertension.  3. Hypothyroidism.  4. History of rheumatoid arthritis.   DISCHARGE DIAGNOSES:  1. Right hip total hip replacement failed.  2. Hypertension.  3. Hypothyroidism.  4. History of rheumatoid arthritis.   OPERATIONS:  Revision right total hip replacement.   BRIEF HISTORY:  [name] [name] is a 76 year old white male patient well-  known to our practice who has a Constrained hip replacement liner which  he has broken after a dislocation.  We have discussed treatment options  with him.  He has been held in a dislocation preventative brace but what  is needed is to repair or put in a new Constrained liner.  We have  discussed with him the treatment options, risk of anesthesia, infection,  DVT and possible death.   PERTINENT LABORATORY DATA AND X-RAY FINDINGS:  EKG normal sinus rhythm.  WBCs 5.5, hemoglobin 11.5, hematocrit 34.3, platelets 40.  Sodium 133,  potassium 3.2, glucose 97, BUN 17, creatinine 1.2.  Serial INRs were  done as he is on low-dose Coumadin protocol as well.   COURSE IN THE HOSPITAL:  He was admitted postoperatively and placed on  variety on p.o. and IM analgesics for pain, IV Ancef 1 g q.8h. x3 doses,  and then he was on low-dose Coumadin protocol prophylaxis per pharmacy  protocol.  He also was on various oral agents including antiemetics and  his home medications which will be outlined at the end of this dictation  including prednisone.  Knee-high TEDs, incentive spirometry, out of bed  to be weightbearing as tolerated as soon  as therapy could work with him.  Condition on the first day postoperative, his wound was noted to be  minor, no signs infection or irritation.  Calf soft and nontender.  There was no significant drainage, positive breath sounds.  Abdomen was  soft, nontender, positive bowel sounds.  Foley catheter was  discontinued.  His dressing was changed the next day postoperatively,  and his wound was noted to be benign.  Blood pressure 146/70, hemoglobin  11.3, potassium slightly low at 3.2, INR 2.3, and he was discharged  home.   CONDITION ON DISCHARGE:  Improved.   FOLLOW UP:  He will be weightbearing as tolerated.  He will have  arrangements for home physical therapy and INR blood draws.  He was  given a prescription for Coumadin dose, regulated by pharmacy, and a  prescription for pain medicines he already has at home.  He continues  dressing daily to recheck with Dr. Jerl Santos in 10 days.  Any  sign of infection, he is to call our office.  He will continue on  levothyroxine, hydrochlorothiazide, prednisone 5 mg a  day, Plavix,  calcium, aspirin.  He will hold finasteride 5 mg a day, Enbrel.  He will  also hold Fosamax.  He will return to our office in 7-10 days.      Lindwood Qua, P.A.      Lubertha Basque Jerl Santos, M.D.  Electronically Signed    MC/MEDQ  D:  09/14/2007  T:  09/14/2007  Job:  [id number]

## 2011-03-21 ENCOUNTER — Encounter (HOSPITAL_BASED_OUTPATIENT_CLINIC_OR_DEPARTMENT_OTHER): Payer: Medicare Other | Admitting: Oncology

## 2011-03-21 ENCOUNTER — Other Ambulatory Visit (HOSPITAL_COMMUNITY): Payer: Self-pay | Admitting: Oncology

## 2011-03-21 DIAGNOSIS — D696 Thrombocytopenia, unspecified: Secondary | ICD-10-CM

## 2011-03-21 DIAGNOSIS — Z7982 Long term (current) use of aspirin: Secondary | ICD-10-CM

## 2011-03-21 DIAGNOSIS — M069 Rheumatoid arthritis, unspecified: Secondary | ICD-10-CM

## 2011-03-21 LAB — LACTATE DEHYDROGENASE: LDH: 205 U/L (ref 94–250)

## 2011-03-21 LAB — CBC WITH DIFFERENTIAL/PLATELET
EOS%: 1.3 % (ref 0.0–7.0)
HCT: 40.4 % (ref 38.4–49.9)
HGB: 13.3 g/dL (ref 13.0–17.1)
LYMPH%: 13.5 % — ABNORMAL LOW (ref 14.0–49.0)
MCHC: 33 g/dL (ref 32.0–36.0)
MCV: 89.3 fL (ref 79.3–98.0)
MONO#: 0.6 10*3/uL (ref 0.1–0.9)
MONO%: 6.9 % (ref 0.0–14.0)
Platelets: 164 10*3/uL (ref 140–400)
RBC: 4.53 10*6/uL (ref 4.20–5.82)

## 2011-03-21 LAB — COMPREHENSIVE METABOLIC PANEL
ALT: 18 U/L (ref 0–53)
Albumin: 3.7 g/dL (ref 3.5–5.2)
BUN: 33 mg/dL — ABNORMAL HIGH (ref 6–23)
Chloride: 105 mEq/L (ref 96–112)
Creatinine, Ser: 1.56 mg/dL — ABNORMAL HIGH (ref 0.50–1.35)
Glucose, Bld: 107 mg/dL — ABNORMAL HIGH (ref 70–99)
Potassium: 5 mEq/L (ref 3.5–5.3)
Sodium: 139 mEq/L (ref 135–145)
Total Bilirubin: 0.6 mg/dL (ref 0.3–1.2)

## 2011-05-08 LAB — BASIC METABOLIC PANEL
BUN: 17
CO2: 26
Calcium: 7.8 — ABNORMAL LOW
Calcium: 8.4
Calcium: 9
Chloride: 107
Creatinine, Ser: 1.14
GFR calc Af Amer: 57 — ABNORMAL LOW
GFR calc Af Amer: >60
GFR calc Af Amer: >60
GFR calc non Af Amer: 47 — ABNORMAL LOW
GFR calc non Af Amer: 60 — ABNORMAL LOW
GFR calc non Af Amer: >60
Potassium: 3.2 — ABNORMAL LOW
Sodium: 133 — ABNORMAL LOW
Sodium: 141

## 2011-05-08 LAB — ANAEROBIC CULTURE

## 2011-05-08 LAB — CBC
HCT: 33.7 — ABNORMAL LOW
Hemoglobin: 11.3 — ABNORMAL LOW
Hemoglobin: 13.9
MCV: 85.9
Platelets: 40 — CL
RBC: 3.93 — ABNORMAL LOW
RBC: 3.99 — ABNORMAL LOW
RBC: 4.91
WBC: 5.5
WBC: 7.1
WBC: 8.2

## 2011-05-08 LAB — CROSSMATCH
ABO/RH(D): O POS
Antibody Screen: NEGATIVE

## 2011-05-08 LAB — POCT I-STAT EG7
Acid-base deficit: 4 — ABNORMAL HIGH
Bicarbonate: 21.1
HCT: 39
O2 Saturation: 96
Operator id: 198871
Patient temperature: 37
pCO2, Ven: 37.7 — ABNORMAL LOW
pO2, Ven: 84 — ABNORMAL HIGH

## 2011-05-08 LAB — GRAM STAIN

## 2011-05-08 LAB — PROTIME-INR
INR: 1
INR: 1.1
INR: 2.3 — ABNORMAL HIGH
Prothrombin Time: 14.1

## 2011-05-08 LAB — APTT: aPTT: 31

## 2011-05-08 LAB — ABO/RH: ABO/RH(D): O POS

## 2011-05-28 LAB — BASIC METABOLIC PANEL
CO2: 23
Chloride: 102
Creatinine, Ser: 1.1
GFR calc Af Amer: >60
Sodium: 134 — ABNORMAL LOW

## 2011-05-28 LAB — DIFFERENTIAL
Basophils Relative: 0
Eosinophils Absolute: 0
Lymphs Abs: 0.7
Monocytes Absolute: 0.5
Monocytes Relative: 8
Neutro Abs: 5.3
Neutrophils Relative %: 81 — ABNORMAL HIGH

## 2011-05-28 LAB — CBC
Hemoglobin: 13.9
MCHC: 33
MCV: 83.3
RBC: 5.06
WBC: 6.6

## 2011-09-26 ENCOUNTER — Telehealth: Payer: Self-pay | Admitting: Oncology

## 2011-09-26 NOTE — Telephone Encounter (Signed)
S/w the pt and he is aware of his aug 2013 appts °

## 2011-11-22 ENCOUNTER — Encounter (HOSPITAL_COMMUNITY): Payer: Self-pay | Admitting: Nurse Practitioner

## 2011-11-22 ENCOUNTER — Emergency Department (HOSPITAL_COMMUNITY)
Admission: EM | Admit: 2011-11-22 | Discharge: 2011-11-22 | Disposition: A | Discharge status: Home / Self Care | Payer: Medicare Other | Attending: Emergency Medicine | Admitting: Emergency Medicine

## 2011-11-22 ENCOUNTER — Emergency Department (HOSPITAL_COMMUNITY): Payer: Medicare Other

## 2011-11-22 DIAGNOSIS — J40 Bronchitis, not specified as acute or chronic: Secondary | ICD-10-CM

## 2011-11-22 DIAGNOSIS — M069 Rheumatoid arthritis, unspecified: Secondary | ICD-10-CM | POA: Insufficient documentation

## 2011-11-22 DIAGNOSIS — R05 Cough: Secondary | ICD-10-CM | POA: Insufficient documentation

## 2011-11-22 DIAGNOSIS — R059 Cough, unspecified: Secondary | ICD-10-CM | POA: Insufficient documentation

## 2011-11-22 HISTORY — DX: Rheumatoid arthritis, unspecified: M06.9

## 2011-11-22 MED ORDER — ALBUTEROL SULFATE (5 MG/ML) 0.5% IN NEBU
2.5000 mg | INHALATION_SOLUTION | Freq: Once | RESPIRATORY_TRACT | Status: AC
  Administered 2011-11-22: 2.5 mg via RESPIRATORY_TRACT
  Filled 2011-11-22: qty 0.5

## 2011-11-22 MED ORDER — AMOXICILLIN 500 MG PO CAPS: 1000.0000 mg | ORAL_CAPSULE | Freq: Two times a day (BID) | ORAL | Status: AC

## 2011-11-22 MED ORDER — PREDNISONE 20 MG PO TABS
60.0000 mg | ORAL_TABLET | Freq: Once | ORAL | Status: AC
  Administered 2011-11-22: 60 mg via ORAL
  Filled 2011-11-22: qty 3

## 2011-11-22 MED ORDER — AMOXICILLIN 500 MG PO CAPS
1000.0000 mg | ORAL_CAPSULE | Freq: Once | ORAL | Status: AC
  Administered 2011-11-22: 1000 mg via ORAL
  Filled 2011-11-22: qty 2

## 2011-11-22 MED ORDER — ALBUTEROL SULFATE HFA 108 (90 BASE) MCG/ACT IN AERS: 2.0000 | INHALATION_SPRAY | RESPIRATORY_TRACT | Status: DC | PRN

## 2011-11-22 MED ORDER — PREDNISONE 20 MG PO TABS: 40.0000 mg | ORAL_TABLET | Freq: Every day | ORAL | Status: DC

## 2011-11-22 MED ORDER — IPRATROPIUM BROMIDE 0.02 % IN SOLN
0.5000 mg | Freq: Once | RESPIRATORY_TRACT | Status: AC
  Administered 2011-11-22: 0.5 mg via RESPIRATORY_TRACT
  Filled 2011-11-22: qty 2.5

## 2011-11-22 NOTE — ED Notes (Signed)
Family at bedside reports that pt stop taking Enbrel 1 1/2 weeks ago due to having pneumonia.

## 2011-11-22 NOTE — Discharge Instructions (Signed)
Stop taking your Levaquin. Talk with your arthritis doctor about how long he should stay off of your embryo. Talk with your primary care doctor about how he wants you to taper your prednisone dose once you're finished with the prescription 9 giving you.  Bronchitis Bronchitis is the body's way of reacting to injury and/or infection (inflammation) of the bronchi. Bronchi are the air tubes that extend from the windpipe into the lungs. If the inflammation becomes severe, it may cause shortness of breath. CAUSES  Inflammation may be caused by:  A virus.   Germs (bacteria).   Dust.   Allergens.   Pollutants and many other irritants.  The cells lining the bronchial tree are covered with tiny hairs (cilia). These constantly beat upward, away from the lungs, toward the mouth. This keeps the lungs free of pollutants. When these cells become too irritated and are unable to do their job, mucus begins to develop. This causes the characteristic cough of bronchitis. The cough clears the lungs when the cilia are unable to do their job. Without either of these protective mechanisms, the mucus would settle in the lungs. Then you would develop pneumonia. Smoking is a common cause of bronchitis and can contribute to pneumonia. Stopping this habit is the single most important thing you can do to help yourself. TREATMENT   Your caregiver may prescribe an antibiotic if the cough is caused by bacteria. Also, medicines that open up your airways make it easier to breathe. Your caregiver may also recommend or prescribe an expectorant. It will loosen the mucus to be coughed up. Only take over-the-counter or prescription medicines for pain, discomfort, or fever as directed by your caregiver.   Removing whatever causes the problem (smoking, for example) is critical to preventing the problem from getting worse.   Cough suppressants may be prescribed for relief of cough symptoms.   Inhaled medicines may be prescribed to  help with symptoms now and to help prevent problems from returning.   For those with recurrent (chronic) bronchitis, there may be a need for steroid medicines.  SEEK IMMEDIATE MEDICAL CARE IF:   During treatment, you develop more pus-like mucus (purulent sputum).   You have a fever.   Your baby is older than 3 months with a rectal temperature of 102 F (38.9 C) or higher.   Your baby is 76 months old or younger with a rectal temperature of 100.4 F (38 C) or higher.   You become progressively more ill.   You have increased difficulty breathing, wheezing, or shortness of breath.  It is necessary to seek immediate medical care if you are elderly or sick from any other disease. MAKE SURE YOU:   Understand these instructions.   Will watch your condition.   Will get help right away if you are not doing well or get worse.  Document Released: 08/04/2005 Document Revised: 07/24/2011 Document Reviewed: 06/13/2008 [facility] Patient Information 2012 [place], [place].  Albuterol inhalation aerosol What is this medicine? ALBUTEROL (al Gaspar Bidding) is a bronchodilator. It helps open up the airways in your lungs to make it easier to breathe. This medicine is used to treat and to prevent bronchospasm. This medicine may be used for other purposes; ask your health care provider or pharmacist if you have questions. What should I tell my health care provider before I take this medicine? They need to know if you have any of the following conditions: -diabetes -heart disease or irregular heartbeat -high blood pressure -pheochromocytoma -seizures -thyroid  disease -an unusual or allergic reaction to albuterol, levalbuterol, sulfites, other medicines, foods, dyes, or preservatives -pregnant or trying to get pregnant -breast-feeding How should I use this medicine? This medicine is for inhalation through the mouth. Follow the directions on your prescription label. Take your medicine at regular  intervals. Do not use more often than directed. Make sure that you are using your inhaler correctly. Ask you doctor or health care provider if you have any questions. Use this medicine before you use any other inhaler. Wait 5 minutes or more before between using different inhalers. Talk to your pediatrician regarding the use of this medicine in children. Special care may be needed. Overdosage: If you think you have taken too much of this medicine contact a poison control center or emergency room at once. NOTE: This medicine is only for you. Do not share this medicine with others. What if I miss a dose? If you miss a dose, use it as soon as you can. If it is almost time for your next dose, use only that dose. Do not use double or extra doses. What may interact with this medicine? -anti-infectives like chloroquine and pentamidine -caffeine -cisapride -diuretics -medicines for colds -medicines for depression or for emotional or psychotic conditions -medicines for weight loss including some herbal products -methadone -some antibiotics like clarithromycin, erythromycin, levofloxacin, and linezolid -some heart medicines -steroid hormones like dexamethasone, cortisone, hydrocortisone -theophylline -thyroid hormones This list may not describe all possible interactions. Give your health care provider a list of all the medicines, herbs, non-prescription drugs, or dietary supplements you use. Also tell them if you smoke, drink alcohol, or use illegal drugs. Some items may interact with your medicine. What should I watch for while using this medicine? Tell your doctor or health care professional if your symptoms do not improve. Do not use extra albuterol. If your asthma or bronchitis gets worse while you are using this medicine, call your doctor right away. If your mouth gets dry try chewing sugarless gum or sucking hard candy. Drink water as directed. What side effects may I notice from receiving this  medicine? Side effects that you should report to your doctor or health care professional as soon as possible: -allergic reactions like skin rash, itching or hives, swelling of the face, lips, or tongue -breathing problems -chest pain -feeling faint or lightheaded, falls -high blood pressure -irregular heartbeat -fever -muscle cramps or weakness -pain, tingling, numbness in the hands or feet -vomiting Side effects that usually do not require medical attention (report to your doctor or health care professional if they continue or are bothersome): -cough -difficulty sleeping -headache -nervousness or trembling -stomach upset -stuffy or runny nose -throat irritation -unusual taste This list may not describe all possible side effects. Call your doctor for medical advice about side effects. You may report side effects to FDA at 1-800-FDA-1088. Where should I keep my medicine? Keep out of the reach of children. Store at room temperature between 15 and 30 degrees C (59 and 86 degrees F). The contents are under pressure and may burst when exposed to heat or flame. Do not freeze. This medicine does not work as well if it is too cold. Throw away any unused medicine after the expiration date. Inhalers need to be thrown away after the labeled number of puffs have been used or by the expiration date; whichever comes first. Ventolin HFA should be thrown away 12 months after removing from foil pouch. Check the instructions that come with your medicine.  NOTE: This sheet is a summary. It may not cover all possible information. If you have questions about this medicine, talk to your doctor, pharmacist, or health care provider.  2012, [employer] Standard. (12/20/2010 11:00:52 AM)  Amoxicillin capsules or tablets What is this medicine? AMOXICILLIN (a mox i SIL in) is a penicillin antibiotic. It is used to treat certain kinds of bacterial infections. It will not work for colds, flu, or other viral  infections. This medicine may be used for other purposes; ask your health care provider or pharmacist if you have questions. What should I tell my health care provider before I take this medicine? They need to know if you have any of these conditions: -asthma -kidney disease -an unusual or allergic reaction to amoxicillin, other penicillins, cephalosporin antibiotics, other medicines, foods, dyes, or preservatives -pregnant or trying to get pregnant -breast-feeding How should I use this medicine? Take this medicine by mouth with a glass of water. Follow the directions on your prescription label. You may take this medicine with food or on an empty stomach. Take your medicine at regular intervals. Do not take your medicine more often than directed. Take all of your medicine as directed even if you think your are better. Do not skip doses or stop your medicine early. Talk to your pediatrician regarding the use of this medicine in children. While this drug may be prescribed for selected conditions, precautions do apply. Overdosage: If you think you have taken too much of this medicine contact a poison control center or emergency room at once. NOTE: This medicine is only for you. Do not share this medicine with others. What if I miss a dose? If you miss a dose, take it as soon as you can. If it is almost time for your next dose, take only that dose. Do not take double or extra doses. What may interact with this medicine? -amiloride -birth control pills -chloramphenicol -macrolides -probenecid -sulfonamides -tetracyclines This list may not describe all possible interactions. Give your health care provider a list of all the medicines, herbs, non-prescription drugs, or dietary supplements you use. Also tell them if you smoke, drink alcohol, or use illegal drugs. Some items may interact with your medicine. What should I watch for while using this medicine? Tell your doctor or health care  professional if your symptoms do not improve in 2 or 3 days. Take all of the doses of your medicine as directed. Do not skip doses or stop your medicine early. If you are diabetic, you may get a false positive result for sugar in your urine with certain brands of urine tests. Check with your doctor. Do not treat diarrhea with over-the-counter products. Contact your doctor if you have diarrhea that lasts more than 2 days or if the diarrhea is severe and watery. What side effects may I notice from receiving this medicine? Side effects that you should report to your doctor or health care professional as soon as possible: -allergic reactions like skin rash, itching or hives, swelling of the face, lips, or tongue -breathing problems -dark urine -redness, blistering, peeling or loosening of the skin, including inside the mouth -seizures -severe or watery diarrhea -trouble passing urine or change in the amount of urine -unusual bleeding or bruising -unusually weak or tired -yellowing of the eyes or skin Side effects that usually do not require medical attention (report to your doctor or health care professional if they continue or are bothersome): -dizziness -headache -stomach upset -trouble sleeping This list may not  describe all possible side effects. Call your doctor for medical advice about side effects. You may report side effects to FDA at 1-800-FDA-1088. Where should I keep my medicine? Keep out of the reach of children. Store between 68 and 77 degrees F (20 and 25 degrees C). Keep bottle closed tightly. Throw away any unused medicine after the expiration date. NOTE: This sheet is a summary. It may not cover all possible information. If you have questions about this medicine, talk to your doctor, pharmacist, or health care provider.  2012, [employer] Standard. (10/26/2007 2:10:59 PM)

## 2011-11-22 NOTE — ED Provider Notes (Signed)
History     CSN: [id number]  Arrival date & time 11/22/11  1324   First MD Initiated Contact with Patient 11/22/11 1625      Chief Complaint  Patient presents with  . Pneumonia    (Consider location/radiation/quality/duration/timing/severity/associated sxs/prior treatment) Patient is a 77 y.o. male presenting with pneumonia. The history is provided by the patient.  Pneumonia  He has had a cough for the last 4 days. Cough is productive of some yellowish sputum. He denies fever, chills, sweats. He denies dyspnea. He went to an urgent care Center where he was given a prescription for levofloxacin and states that every time he takes for levofloxacin he vomits about 30 minutes later. He was also given a prescription for Hycodan cough syrup which does suppress the cough, but makes him sleep. Of note, he does not vomit except when he takes the levofloxacin. Symptoms are generally worse at night. Of note, he is on Enbrel and prednisone for her rheumatoid arthritis.  Past Medical History  Diagnosis Date  . Rheumatoid arthritis     Past Surgical History  Procedure Date  . Carotid stent     History reviewed. No pertinent family history.  History  Substance Use Topics  . Smoking status: Former Games developer  . Smokeless tobacco: Not on file  . Alcohol Use: No      Review of Systems  All other systems reviewed and are negative.    Allergies  Shellfish allergy and Aspirin  Home Medications   Current Outpatient Rx  Name Route Sig Dispense Refill  . TYLENOL PO Oral Take 1 tablet by mouth as needed. For pain.    . ALENDRONATE SODIUM 70 MG PO TABS Oral Take 70 mg by mouth every 7 (seven) days. Take on Sundays. Take with a full glass of water on an empty stomach.    . ASPIRIN 325 MG PO TABS Oral Take 325 mg by mouth every evening.    Marland Kitchen CALCIUM CARBONATE 600 MG PO TABS Oral Take 600 mg by mouth every evening.    Marland Kitchen ETANERCEPT 50 MG/ML [place] SOLN Subcutaneous Inject 25 mg into the skin once a  week. On Saturdays and Wednesdays.    . OMEGA-3 FATTY ACIDS 1000 MG PO CAPS Oral Take 1 g by mouth daily.    Marland Kitchen HYDROCODONE-HOMATROPINE 5-1.5 MG/5ML PO SYRP Oral Take 5-10 mLs by mouth every 4 (four) hours as needed. For cough.    Marland Kitchen LEVOFLOXACIN 500 MG PO TABS Oral Take 500 mg by mouth daily.    Marland Kitchen LEVOTHYROXINE SODIUM 75 MCG PO TABS Oral Take 75 mcg by mouth every morning.    Marland Kitchen LISINOPRIL 5 MG PO TABS Oral Take 5 mg by mouth every morning.    . ADULT MULTIVITAMIN W/MINERALS CH Oral Take 1 tablet by mouth daily.    Marland Kitchen NITROGLYCERIN 0.4 MG SL SUBL Sublingual Place 0.4 mg under the tongue every 5 (five) minutes x 3 doses as needed. For chest pain.    Marland Kitchen PREDNISONE 5 MG PO TABS Oral Take 5 mg by mouth every morning.    Marland Kitchen SIMVASTATIN 40 MG PO TABS Oral Take 40 mg by mouth every evening.      BP 133/88  Pulse 64  Temp(Src) 97.6 F (36.4 C) (Oral)  Resp 20  Ht 5\' 6"  (1.676 m)  Wt 140 lb (63.504 kg)  BMI 22.60 kg/m2  SpO2 100%  Physical Exam  Nursing note and vitals reviewed.  77 year old male who is resting comfortably in no  acute distress. Vital signs are normal. Oxygen saturation is 98% which is normal. Head is normocephalic and atraumatic. PERRLA, EOMI PERRLA pharynx is clear. Neck is nontender and supple without adenopathy or JVD. Lungs have diffuse rhonchi with prolonged exhalation phase. No rales or wheezes are heard. Heart has regular rate and rhythm without murmur. Abdomen is soft, flat, nontender without masses or hepatosplenomegaly. Extremities have deformities of long-standing rheumatoid arthritis, but no cyanosis or edema. Skin is warm and dry without rash. Neurologic: Mental status is normal, cranial nerves are intact, there are no focal motor or sensory deficits.  ED Course  Procedures (including critical care time)  Labs Reviewed - No data to display Dg Chest 2 View  11/22/2011  *RADIOLOGY REPORT*  Clinical Data: Pneumonia  CHEST - 2 VIEW  Comparison: 12/11/2008  Findings:  Cardiomediastinal silhouette is stable.  No acute infiltrate or pulmonary edema.  Stable left humeral prosthesis. Extensive degenerative changes right shoulder again noted.  Diffuse osteopenia again noted.  Stable compression fracture upper lumbar spine.  Atherosclerotic calcifications of thoracic aorta again noted.  Stable hyperinflation and chronic interstitial and fibrotic changes.  IMPRESSION: No active disease.  Stable hyperinflation and chronic fibrotic changes.  Original Report Authenticated By: Natasha Mead, M.D.     Results for orders placed during the hospital encounter of 11/22/11  GLUCOSE, CAPILLARY      Component Value Range   Glucose-Capillary 93  70 - 99 (mg/dL)   He got excellent subjective relief with an albuterol nebulizer treatment. On reexam, lungs are completely clear. You'll be sent home with prescription for amoxicillin and albuterol inhaler. His prednisone dose will be increased to 40 mg a day for 5 days. He is to contact his PCP to direct his taper back to his regular prednisone dose.  1. Bronchitis       MDM  Respiratory tract infection. Vomiting appears to be a side effect of the levofloxacin. He states it is actually improving. However, in light of his immune modulator treatment, he should stay on antibiotics. He'll be given an albuterol nebulizer treatment and reassessed.        Dione Booze, MD 11/22/11 [id number]

## 2011-11-22 NOTE — ED Notes (Signed)
Pt was seen at [facility] earlier this week and diagnosed with pneumonia. Started on oral levaquin which he is taking but continues to have n/v/headaches/cough. No sob. A&Ox4

## 2011-11-22 NOTE — ED Notes (Signed)
cbg reads 93.

## 2011-11-22 NOTE — ED Notes (Signed)
Pt finished neb. Treatment.

## 2012-03-01 ENCOUNTER — Encounter (HOSPITAL_COMMUNITY): Payer: Self-pay | Admitting: Pharmacy Technician

## 2012-03-02 ENCOUNTER — Other Ambulatory Visit: Payer: Self-pay | Admitting: Interventional Cardiology

## 2012-03-04 ENCOUNTER — Ambulatory Visit (HOSPITAL_COMMUNITY)
Admission: RE | Admit: 2012-03-04 | Discharge: 2012-03-05 | Disposition: A | Discharge status: Home / Self Care | Payer: Medicare Other | Source: Ambulatory Visit | Attending: Interventional Cardiology | Admitting: Interventional Cardiology

## 2012-03-04 ENCOUNTER — Encounter (HOSPITAL_COMMUNITY): Payer: Self-pay | Admitting: General Practice

## 2012-03-04 ENCOUNTER — Other Ambulatory Visit: Payer: Self-pay

## 2012-03-04 ENCOUNTER — Encounter (HOSPITAL_COMMUNITY)
Admission: RE | Disposition: A | Discharge status: Home / Self Care | Payer: Self-pay | Source: Ambulatory Visit | Attending: Interventional Cardiology

## 2012-03-04 DIAGNOSIS — I209 Angina pectoris, unspecified: Secondary | ICD-10-CM | POA: Insufficient documentation

## 2012-03-04 DIAGNOSIS — R0609 Other forms of dyspnea: Secondary | ICD-10-CM

## 2012-03-04 DIAGNOSIS — N183 Chronic kidney disease, stage 3 (moderate): Secondary | ICD-10-CM

## 2012-03-04 DIAGNOSIS — Z955 Presence of coronary angioplasty implant and graft: Secondary | ICD-10-CM

## 2012-03-04 DIAGNOSIS — R0600 Dyspnea, unspecified: Secondary | ICD-10-CM

## 2012-03-04 DIAGNOSIS — M069 Rheumatoid arthritis, unspecified: Secondary | ICD-10-CM | POA: Insufficient documentation

## 2012-03-04 DIAGNOSIS — I2510 Atherosclerotic heart disease of native coronary artery without angina pectoris: Secondary | ICD-10-CM | POA: Insufficient documentation

## 2012-03-04 DIAGNOSIS — N189 Chronic kidney disease, unspecified: Secondary | ICD-10-CM

## 2012-03-04 HISTORY — DX: Other forms of dyspnea: R06.09

## 2012-03-04 HISTORY — DX: Pure hypercholesterolemia, unspecified: E78.00

## 2012-03-04 HISTORY — DX: Chronic kidney disease, unspecified: N18.9

## 2012-03-04 HISTORY — PX: LEFT HEART CATHETERIZATION WITH CORONARY ANGIOGRAM: SHX5451

## 2012-03-04 HISTORY — DX: Pneumonia, unspecified organism: J18.9

## 2012-03-04 HISTORY — DX: Essential (primary) hypertension: I10

## 2012-03-04 HISTORY — DX: Atherosclerotic heart disease of native coronary artery without angina pectoris: I25.10

## 2012-03-04 HISTORY — DX: Dyspnea, unspecified: R06.00

## 2012-03-04 HISTORY — DX: Hypothyroidism, unspecified: E03.9

## 2012-03-04 HISTORY — PX: CORONARY ANGIOPLASTY WITH STENT PLACEMENT: SHX49

## 2012-03-04 SURGERY — LEFT HEART CATHETERIZATION WITH CORONARY ANGIOGRAM
Anesthesia: LOCAL

## 2012-03-04 MED ORDER — HYDROCODONE-HOMATROPINE 5-1.5 MG/5ML PO SYRP
5.0000 mL | ORAL_SOLUTION | ORAL | Status: DC | PRN
Start: 2012-03-04 — End: 2012-03-05

## 2012-03-04 MED ORDER — SODIUM BICARBONATE 8.4 % IV SOLN
INTRAVENOUS | Status: AC
  Administered 2012-03-04: 13:00:00 via INTRAVENOUS
  Filled 2012-03-04: qty 1000

## 2012-03-04 MED ORDER — DEXTROSE 5 % IV SOLN: INTRAVENOUS | Status: DC

## 2012-03-04 MED ORDER — ASPIRIN 81 MG PO CHEW
81.0000 mg | CHEWABLE_TABLET | ORAL | Status: AC
  Administered 2012-03-04: 81 mg via ORAL
  Filled 2012-03-04: qty 4

## 2012-03-04 MED ORDER — NITROGLYCERIN 0.2 MG/ML ON CALL CATH LAB
INTRAVENOUS | Status: AC
  Filled 2012-03-04: qty 1

## 2012-03-04 MED ORDER — ADULT MULTIVITAMIN W/MINERALS CH
1.0000 | ORAL_TABLET | Freq: Every day | ORAL | Status: DC
  Administered 2012-03-04 – 2012-03-05 (×2): 1 via ORAL
  Filled 2012-03-04 (×2): qty 1

## 2012-03-04 MED ORDER — ONDANSETRON HCL 4 MG/2ML IJ SOLN: 4.0000 mg | Freq: Four times a day (QID) | INTRAMUSCULAR | Status: DC | PRN

## 2012-03-04 MED ORDER — FENTANYL CITRATE 0.05 MG/ML IJ SOLN
INTRAMUSCULAR | Status: AC
  Filled 2012-03-04: qty 2

## 2012-03-04 MED ORDER — ALENDRONATE SODIUM 70 MG PO TABS: 70.0000 mg | ORAL_TABLET | ORAL | Status: DC

## 2012-03-04 MED ORDER — DEXTROSE 5 % IV SOLN
INTRAVENOUS | Status: AC
  Administered 2012-03-04: 10:00:00 via INTRAVENOUS
  Filled 2012-03-04: qty 1000

## 2012-03-04 MED ORDER — CLOPIDOGREL BISULFATE 300 MG PO TABS
ORAL_TABLET | ORAL | Status: AC
  Filled 2012-03-04: qty 1

## 2012-03-04 MED ORDER — CLOPIDOGREL BISULFATE 75 MG PO TABS
75.0000 mg | ORAL_TABLET | Freq: Every day | ORAL | Status: DC
  Administered 2012-03-05: 75 mg via ORAL
  Filled 2012-03-04: qty 1

## 2012-03-04 MED ORDER — SODIUM CHLORIDE 0.9 % IV SOLN
INTRAVENOUS | Status: DC
  Administered 2012-03-04: 08:00:00 via INTRAVENOUS

## 2012-03-04 MED ORDER — SODIUM BICARBONATE 8.4 % IV SOLN: INTRAVENOUS | Status: DC

## 2012-03-04 MED ORDER — ALBUTEROL SULFATE HFA 108 (90 BASE) MCG/ACT IN AERS
2.0000 | INHALATION_SPRAY | RESPIRATORY_TRACT | Status: DC | PRN
  Filled 2012-03-04: qty 6.7

## 2012-03-04 MED ORDER — ASPIRIN 81 MG PO CHEW
81.0000 mg | CHEWABLE_TABLET | Freq: Every day | ORAL | Status: DC
  Administered 2012-03-05: 10:00:00 81 mg via ORAL
  Filled 2012-03-04 (×2): qty 1

## 2012-03-04 MED ORDER — DIAZEPAM 5 MG PO TABS
5.0000 mg | ORAL_TABLET | ORAL | Status: AC
  Administered 2012-03-04: 5 mg via ORAL
  Filled 2012-03-04: qty 1

## 2012-03-04 MED ORDER — SODIUM CHLORIDE 0.9 % IV SOLN
1.0000 mL/kg/h | INTRAVENOUS | Status: AC
  Administered 2012-03-04: 1 mL/kg/h via INTRAVENOUS

## 2012-03-04 MED ORDER — HYDRALAZINE HCL 20 MG/ML IJ SOLN
10.0000 mg | INTRAMUSCULAR | Status: DC | PRN
  Administered 2012-03-04: 10 mg via INTRAVENOUS
  Filled 2012-03-04: qty 1

## 2012-03-04 MED ORDER — MIDAZOLAM HCL 2 MG/2ML IJ SOLN
INTRAMUSCULAR | Status: AC
  Filled 2012-03-04: qty 2

## 2012-03-04 MED ORDER — MORPHINE SULFATE 2 MG/ML IJ SOLN: 1.0000 mg | INTRAMUSCULAR | Status: DC | PRN

## 2012-03-04 MED ORDER — BIVALIRUDIN 250 MG IV SOLR
INTRAVENOUS | Status: AC
  Filled 2012-03-04: qty 250

## 2012-03-04 MED ORDER — SODIUM CHLORIDE 0.9 % IJ SOLN: 3.0000 mL | Freq: Two times a day (BID) | INTRAMUSCULAR | Status: DC

## 2012-03-04 MED ORDER — HEPARIN (PORCINE) IN NACL 2-0.9 UNIT/ML-% IJ SOLN
INTRAMUSCULAR | Status: AC
  Filled 2012-03-04: qty 2000

## 2012-03-04 MED ORDER — HYDRALAZINE HCL 20 MG/ML IJ SOLN
INTRAMUSCULAR | Status: AC
  Filled 2012-03-04: qty 1

## 2012-03-04 MED ORDER — LEVOTHYROXINE SODIUM 75 MCG PO TABS
75.0000 ug | ORAL_TABLET | Freq: Every day | ORAL | Status: DC
  Administered 2012-03-05: 06:00:00 75 ug via ORAL
  Filled 2012-03-04 (×2): qty 1

## 2012-03-04 MED ORDER — SIMVASTATIN 40 MG PO TABS
40.0000 mg | ORAL_TABLET | Freq: Every evening | ORAL | Status: DC
  Administered 2012-03-04: 19:00:00 40 mg via ORAL
  Filled 2012-03-04 (×2): qty 1

## 2012-03-04 MED ORDER — NITROGLYCERIN 0.4 MG SL SUBL: 0.4000 mg | SUBLINGUAL_TABLET | SUBLINGUAL | Status: DC | PRN

## 2012-03-04 MED ORDER — PREDNISONE 5 MG PO TABS
5.0000 mg | ORAL_TABLET | Freq: Every morning | ORAL | Status: DC
  Administered 2012-03-05: 10:00:00 5 mg via ORAL
  Filled 2012-03-04: qty 1

## 2012-03-04 MED ORDER — SODIUM CHLORIDE 0.9 % IJ SOLN: 3.0000 mL | INTRAMUSCULAR | Status: DC | PRN

## 2012-03-04 MED ORDER — ACETAMINOPHEN 325 MG PO TABS
650.0000 mg | ORAL_TABLET | ORAL | Status: DC | PRN
  Administered 2012-03-04 – 2012-03-05 (×2): 650 mg via ORAL
  Filled 2012-03-04 (×2): qty 2

## 2012-03-04 MED ORDER — SODIUM CHLORIDE 0.9 % IV SOLN: 250.0000 mL | INTRAVENOUS | Status: DC | PRN

## 2012-03-04 MED ORDER — LIDOCAINE HCL (PF) 1 % IJ SOLN
INTRAMUSCULAR | Status: AC
  Filled 2012-03-04: qty 30

## 2012-03-04 NOTE — Progress Notes (Signed)
Patient developed frequent PAC's and heart rate dropped in 40's nonsustained then up to 70 BP 102/32 asymptomatic. Notified Dr. Katrinka Blazing and night RN aware she will continue to monitor.

## 2012-03-04 NOTE — H&P (Signed)
  Date of Initial H&P: 03/04/12  History reviewed, patient examined, no change in status, stable for surgery. 

## 2012-03-04 NOTE — CV Procedure (Addendum)
PROCEDURE:  Left heart catheterization with selective coronary angiography, IVUS of the LAD, PCI of the proximal to mid LAD.  INDICATIONS:  New onset angina, DOE, fatigue , CAD  The risks, benefits, and details of the procedure were explained to the patient.  The patient verbalized understanding and wanted to proceed.  Informed written consent was obtained.  PROCEDURE TECHNIQUE:  After Xylocaine anesthesia a 26F sheath was placed in the right femoral artery with a single anterior needle wall stick.   Left coronary angiography was done using a Judkins L4 guide catheter.  Right coronary angiography was done using a Judkins R4 guide catheter.  Left ventriculography was done using a pigtail catheter.    CONTRAST:  Total of 155 cc.  COMPLICATIONS:  None.    HEMODYNAMICS:  Aortic pressure was 172/74; LV pressure was 173/9; LVEDP 18.  There was no gradient between the left ventricle and aorta.    ANGIOGRAPHIC DATA:   The left main coronary artery is a short vessel which is widely patent..  The left anterior descending artery is a large vessel which reaches the apex.  The entire proximal to mid vessel has diffuse disease.  The stent in the mid vessel is patent but there is in-stent restenosis.  There is a first diagonal which is small and patent.  The second diagonal is jailed by the prior stent and has a proximal 50% stenosis.  The distal vessel appears healthy.  The left circumflex artery is a large vessel.  There is a large first obtuse marginal which has mild irregularities.  The stent in this vessel has only mild in-stent restenosis.  There is a second obtuse marginal which is medium-sized and widely patent..  The right coronary artery is a large dominant vessel.  There is mild to moderate diffuse atherosclerosis and calcification in the proximal to mid vessel.  The stent in the distal right coronary artery has moderate in-stent restenosis.  The very distal RCA before the bifurcation of the PDA and  posterior lateral artery has mild irregularities and calcification, but does not appear to have daily significant disease.  There is up to 50% stenosis in the distal RCA.  The posterior lateral artery is a large vessel which appears widely patent.  The posterior descending artery is a medium-sized vessel which also appears widely patent.  LEFT VENTRICULOGRAM:  Left ventricular angiogram was not done.  PCI NARRATIVE:  A CLS 3.5 guiding catheters used to engage the left main.  Angiomax used for anticoagulation.  An ACT was performed to ensure that the Angiomax is therapeutic.  A pro-water wire was placed across the entire diseased area in the proximal to mid LAD.  An intravascular ultrasound catheter was advanced and images were obtained.  This showed significant in-stent restenosis in the previously placed bare metal stent in the mid LAD.  Cross-sectional area in the mid LAD was 2.5 mm in the proximal LAD was 2.5 mm2.  The lumen was slightly larger than the catheter. There was moderate disease in the proximal LAD which had some calcification.  The ostium of the LAD was widely patent with only mild atherosclerosis.  The cross-sectional area of the ostial LAD was 6 mm2.   A 2.5 x 10 cutting balloon was used to predilate the area of in-stent restenosis.  Several inflations were performed to 10 atmospheres.  A 3.0 x 32 Promus drug-eluting stent was deployed to cover the distal portion of the stented area and extending into native vessel.  The stent  was deployed at 11 atmospheres.  A 3.5 x 12 [place] Quantum apex was used to post dilate the stent inflated up to 16 atmospheres.  There was a hazy appearance in the proximal LAD.  The intravascular ultrasound catheter was readvanced into the LAD.  This showed that there was some additional in-stent restenosis that was not covered by this 32 mm stent.  Therefore, a 3.5 x 16 Promus drug-eluting stent was deployed in an overlapping fashion across the proximal edge of the LAD  stent to 14 atmospheres.  The stent was postdilated with a 3.5 x 12 [place] Quantum apex balloon inflated to 16 atmospheres, 20 atmospheres, and then 18 atmospheres at the ostium.  There is an excellent angiographic result.  There were several millimeters but were not stented in the ostial/proximal LAD.  The circumflex ostium was widely patent.  The second diagonal was jailed and there is a 70% proximal stenosis, but there is TIMI-3 flow.  The patient had anginal symptoms just with the IVUS catheter in place.  He had severe angina with all balloon inflations.  This did replicate symptoms that he had at home.  Several doses of intracoronary nitroglycerin were given to treat vasospasm caused by the guide catheter advancing into the proximal LAD.  IMPRESSIONS:  1.  Patent  left main coronary artery. 2.  Successful overlapping drug-eluting stent placement in the proximal to mid  left anterior descending artery with a 3.0 x 32 Promus elements and 3.5 x 16 Promus element stents.  These were placed to treat in-stent restenosis in the mid LAD and de novo coronary artery artery disease in the proximal LAD.   3.  Patent stent in the first obtuse marginal.   4.  Patent stent in the mid to distal right coronary artery with 40-50% in-stent restenosis.  Moderate distal RCA disease, but patent prior Rotablator result.   5.   LVEDP 18  mmHg.  Ejection fraction not assessed.  RECOMMENDATION:  The patient will be watched overnight.  He'll need to continue dual antiplatelet therapy for at least a year and likely indefinitely.  Continue aggressive medical therapy.  Continue bicarbonate drip for 6 hours given his mild renal insufficiency.  Will hold ACE inhibitor today as well.

## 2012-03-05 LAB — BASIC METABOLIC PANEL
BUN: 28 mg/dL — ABNORMAL HIGH (ref 6–23)
CO2: 24 mEq/L (ref 19–32)
Chloride: 105 mEq/L (ref 96–112)
Creatinine, Ser: 1.18 mg/dL (ref 0.50–1.35)
GFR calc Af Amer: 68 mL/min — ABNORMAL LOW (ref >90)
Glucose, Bld: 92 mg/dL (ref 70–99)
Potassium: 3.4 mEq/L — ABNORMAL LOW (ref 3.5–5.1)

## 2012-03-05 LAB — CBC
HCT: 35.6 % — ABNORMAL LOW (ref 39.0–52.0)
Hemoglobin: 11.8 g/dL — ABNORMAL LOW (ref 13.0–17.0)
MCH: 28.5 pg (ref 26.0–34.0)
MCHC: 33.1 g/dL (ref 30.0–36.0)
MCV: 86 fL (ref 78.0–100.0)
RDW: 14.3 % (ref 11.5–15.5)

## 2012-03-05 MED ORDER — CLOPIDOGREL BISULFATE 75 MG PO TABS: 75.0000 mg | ORAL_TABLET | Freq: Every day | ORAL | Status: AC

## 2012-03-05 MED ORDER — ASPIRIN 81 MG PO CHEW: 81.0000 mg | CHEWABLE_TABLET | Freq: Every day | ORAL | Status: AC

## 2012-03-05 MED ORDER — POTASSIUM CHLORIDE CRYS ER 20 MEQ PO TBCR
40.0000 meq | EXTENDED_RELEASE_TABLET | Freq: Once | ORAL | Status: AC
  Administered 2012-03-05: 40 meq via ORAL
  Filled 2012-03-05: qty 2

## 2012-03-05 MED FILL — Dextrose Inj 5%: INTRAVENOUS | Qty: 50 | Status: AC

## 2012-03-05 NOTE — Progress Notes (Signed)
CARDIAC REHAB PHASE I   PRE:  Rate/Rhythm: 76 SR  BP:  Supine: 106/53  Sitting:   Standing:    SaO2:   MODE:  Ambulation: 700 ft   POST:  Rate/Rhythem: 109 ST  BP:  Supine:   Sitting: to bathroom after walk  Standing:    SaO2:  0755-0850 Tolerated ambulation well without c/o of cp or SOB. VS stable Pt to bathroom after walk, then to recliner. Completed stent discharge education with pt. He agrees to [employer]. CRP in [place], will send referral. Pt states that his SOB is better since having stent placed.  [name] [name]

## 2012-03-05 NOTE — Discharge Summary (Signed)
Patient ID: [name] [name] MRN: [id number] DOB/AGE: March 13, 1935 77 y.o.  Admit date: 03/04/2012 Discharge date: 03/05/2012  Primary Discharge Diagnosis Coronary disease Secondary Discharge Diagnosis Angina, Rheumatoid arthritis, hypokalemia, PACs  Significant Diagnostic Studies: angiography: cardiac cath with IVUS and overlapping DES to the proximal to mid LAD.  Consults: None  Hospital Course: 77 y/o who has CAD.  He underwent cath with the above results.He tolerated the procedure well.  Balloon inflations reproduced his symptoms. No bleeding problems postprocedure.  Potassium was replaced.  Bicarb was used due to mild renal insufficiency.  Cr was stable. He had PACs on telemetry along with some bradycardia which may have been related to blocked PACs.  No symptomos with the arrhythmia.  This has been a longstanding issue for him.  He felt better walking with cardiac rehab.   Discharge Exam: Blood pressure 106/53, pulse 78, temperature 98 F (36.7 C), temperature source Oral, resp. rate 25, height 5\' 6"  (1.676 m), weight 63.7 kg (140 lb 6.9 oz), SpO2 94.00%.   [place]/AT RRR S1S2 CTA bilaterally No hematoma 1+ right DP pulse No edema Labs:   Lab Results  Component Value Date   WBC 6.3 03/05/2012   HGB 11.8* 03/05/2012   HCT 35.6* 03/05/2012   MCV 86.0 03/05/2012   PLT 179 03/05/2012    Lab 03/05/12 0700  NA 140  K 3.4*  CL 105  CO2 24  BUN 28*  CREATININE 1.18  CALCIUM 8.3*  PROT --  BILITOT --  ALKPHOS --  ALT --  AST --  GLUCOSE 92   No results found for this basename: CKTOTAL, CKMB, CKMBINDEX, TROPONINI    No results found for this basename: CHOL   No results found for this basename: HDL   No results found for this basename: LDLCALC   No results found for this basename: TRIG   No results found for this basename: CHOLHDL   No results found for this basename: LDLDIRECT       EKG:NSR, NSST, possible U wave  FOLLOW UP PLANS AND APPOINTMENTS Discharge Orders      Future Appointments: Provider: Department: Dept Phone: Center:   04/06/2012 10:30 AM Radene Gunning [facility] [phone] None   04/06/2012 11:00 AM Samul Dada, MD [facility] [phone] None     Future Orders Please Complete By Expires   Amb Referral to Cardiac Rehabilitation        Medication List  As of 03/05/2012  9:07 AM   STOP taking these medications         aspirin 325 MG tablet         TAKE these medications         albuterol 108 (90 BASE) MCG/ACT inhaler   Commonly known as: PROVENTIL HFA;VENTOLIN HFA   Inhale 2 puffs into the lungs every 4 (four) hours as needed for wheezing (or cough).      alendronate 70 MG tablet   Commonly known as: FOSAMAX   Take 70 mg by mouth every 7 (seven) days. Take on Sundays. Take with a full glass of water on an empty stomach.      aspirin 81 MG chewable tablet   Chew 1 tablet (81 mg total) by mouth daily.      calcium carbonate 600 MG Tabs   Commonly known as: OS-CAL   Take 600 mg by mouth every evening.      clopidogrel 75 MG tablet   Commonly known as: PLAVIX   Take 1 tablet (75 mg  total) by mouth daily with breakfast.      etanercept 50 MG/ML injection   Commonly known as: ENBREL   Inject 25 mg into the skin once a week. On Saturdays and Wednesdays.      fish oil-omega-3 fatty acids 1000 MG capsule   Take 1 g by mouth daily.      HYDROcodone-homatropine 5-1.5 MG/5ML syrup   Commonly known as: HYCODAN   Take 5-10 mLs by mouth every 4 (four) hours as needed. For cough.      levothyroxine 75 MCG tablet   Commonly known as: SYNTHROID, LEVOTHROID   Take 75 mcg by mouth every morning.      lisinopril 5 MG tablet   Commonly known as: PRINIVIL,ZESTRIL   Take 5 mg by mouth every morning.      multivitamin with minerals Tabs   Take 1 tablet by mouth daily.      nitroGLYCERIN 0.4 MG SL tablet   Commonly known as: NITROSTAT   Place 0.4 mg under the tongue every 5 (five) minutes x 3 doses as needed. For chest  pain.      predniSONE 5 MG tablet   Commonly known as: DELTASONE   Take 5 mg by mouth every morning.      simvastatin 40 MG tablet   Commonly known as: ZOCOR   Take 40 mg by mouth every evening.      TYLENOL PO   Take 1 tablet by mouth as needed. For pain.           Follow-up Information    Follow up with Corky Crafts., MD. Call in 3 weeks.   Contact information:   [address] [place] [address] [phone]          BRING ALL MEDICATIONS WITH YOU TO FOLLOW UP APPOINTMENTS  Time spent with patient to include physician time:20 minutes Signed: Laydon Martis S. 03/05/2012, 9:07 AM

## 2012-03-05 NOTE — Progress Notes (Signed)
Utilization Review Completed.Massie Cogliano T7/19/2013   

## 2012-03-17 ENCOUNTER — Telehealth: Payer: Self-pay | Admitting: Oncology

## 2012-03-17 NOTE — Telephone Encounter (Signed)
not able to leave a message,mailed new appt out to pt as dr dm is on pal 8/20    aom

## 2012-03-19 ENCOUNTER — Ambulatory Visit: Payer: Medicare Other | Admitting: Oncology

## 2012-03-19 ENCOUNTER — Other Ambulatory Visit: Payer: Medicare Other | Admitting: Lab

## 2012-04-05 ENCOUNTER — Telehealth: Payer: Self-pay | Admitting: Oncology

## 2012-04-05 NOTE — Telephone Encounter (Signed)
pt called and l/m to cx and not r.s 9/17 appts    aom

## 2012-04-06 ENCOUNTER — Other Ambulatory Visit: Payer: Medicare Other | Admitting: Lab

## 2012-04-06 ENCOUNTER — Ambulatory Visit: Payer: Medicare Other | Admitting: Oncology

## 2012-05-04 ENCOUNTER — Other Ambulatory Visit: Payer: Medicare Other | Admitting: Lab

## 2012-05-04 ENCOUNTER — Ambulatory Visit: Payer: Self-pay | Admitting: Oncology

## 2013-06-15 DIAGNOSIS — I2510 Atherosclerotic heart disease of native coronary artery without angina pectoris: Secondary | ICD-10-CM | POA: Insufficient documentation

## 2013-06-15 DIAGNOSIS — E039 Hypothyroidism, unspecified: Secondary | ICD-10-CM | POA: Insufficient documentation

## 2013-06-15 DIAGNOSIS — E7800 Pure hypercholesterolemia, unspecified: Secondary | ICD-10-CM | POA: Insufficient documentation

## 2013-06-15 DIAGNOSIS — J189 Pneumonia, unspecified organism: Secondary | ICD-10-CM | POA: Insufficient documentation

## 2013-06-15 DIAGNOSIS — Z9861 Coronary angioplasty status: Secondary | ICD-10-CM

## 2013-06-15 DIAGNOSIS — I10 Essential (primary) hypertension: Secondary | ICD-10-CM | POA: Insufficient documentation

## 2013-06-17 ENCOUNTER — Encounter: Payer: Self-pay | Admitting: Interventional Cardiology

## 2013-06-17 ENCOUNTER — Ambulatory Visit (INDEPENDENT_AMBULATORY_CARE_PROVIDER_SITE_OTHER): Payer: Medicare Other | Admitting: Interventional Cardiology

## 2013-06-17 VITALS — BP 108/60 | HR 106 | Ht 66.0 in | Wt 130.0 lb

## 2013-06-17 DIAGNOSIS — E782 Mixed hyperlipidemia: Secondary | ICD-10-CM

## 2013-06-17 DIAGNOSIS — E039 Hypothyroidism, unspecified: Secondary | ICD-10-CM

## 2013-06-17 DIAGNOSIS — I2510 Atherosclerotic heart disease of native coronary artery without angina pectoris: Secondary | ICD-10-CM

## 2013-06-17 DIAGNOSIS — I10 Essential (primary) hypertension: Secondary | ICD-10-CM

## 2013-06-17 DIAGNOSIS — Z01810 Encounter for preprocedural cardiovascular examination: Secondary | ICD-10-CM

## 2013-06-17 DIAGNOSIS — I6529 Occlusion and stenosis of unspecified carotid artery: Secondary | ICD-10-CM | POA: Insufficient documentation

## 2013-06-17 MED ORDER — LISINOPRIL 5 MG PO TABS: 5.0000 mg | ORAL_TABLET | Freq: Every morning | ORAL | Status: DC

## 2013-06-17 MED ORDER — NITROGLYCERIN 0.4 MG SL SUBL: 0.4000 mg | SUBLINGUAL_TABLET | SUBLINGUAL | Status: DC | PRN

## 2013-06-17 NOTE — Progress Notes (Signed)
Patient ID: [name] [name], male   DOB: 1935/02/06, 78 y.o.   MRN: [id number]    [address] [place], [place]  [address] Phone: [phone] Fax:  [phone]  Date:  06/17/2013   ID:  [name] [name], DOB Jan 13, 1935, MRN [id number]  PCP:  Pcp Not In System      History of Present Illness: [name] [name] is a 78 y.o. male  who has had CAD. He had an LAD intervention in 7/13. CAD/ASCVD:  cough resolved after starting pill for acid reflux; some days he had trouble walking due to [facility].  THis resolved with the acid reflux pill.   c/o Dyspnea on exertion no change, has not been walking recently with the bad weather.  Denies : Chest pain.  Dizziness.  Leg edema.  Nitroglycerin.  Orthopnea.  Palpitations.     Wt Readings from Last 3 Encounters:  06/17/13 130 lb (58.968 kg)  03/05/12 140 lb 6.9 oz (63.7 kg)  03/05/12 140 lb 6.9 oz (63.7 kg)     Past Medical History  Diagnosis Date  . Rheumatoid arthritis(714.0)   . Coronary artery disease   . High cholesterol   . Hypertension   . Pneumonia     "has had walking pneumonia twice"  . Exertional dyspnea 03/04/12    "last few months"  . Hypothyroidism   . Chronic kidney disease 03/04/12    "running ~ 40%"    Current Outpatient Prescriptions  Medication Sig Dispense Refill  . Acetaminophen (TYLENOL PO) Take 1 tablet by mouth as needed. For pain.      Marland Kitchen etanercept (ENBREL) 50 MG/ML injection Inject 25 mg into the skin once a week. On Saturdays and Wednesdays.      . fish oil-omega-3 fatty acids 1000 MG capsule Take 1 g by mouth daily.      Marland Kitchen levothyroxine (SYNTHROID, LEVOTHROID) 75 MCG tablet Take 75 mcg by mouth every morning.      Marland Kitchen lisinopril (PRINIVIL,ZESTRIL) 5 MG tablet Take 5 mg by mouth every morning.      . Multiple Vitamin (MULITIVITAMIN WITH MINERALS) TABS Take 1 tablet by mouth daily.      . nitroGLYCERIN (NITROSTAT) 0.4 MG SL tablet Place 0.4 mg under the tongue every 5 (five) minutes x 3 doses as needed.  For chest pain.      . pantoprazole (PROTONIX) 40 MG tablet Take 40 mg by mouth daily.      . predniSONE (DELTASONE) 5 MG tablet Take 5 mg by mouth every morning.      . simvastatin (ZOCOR) 40 MG tablet Take 40 mg by mouth every evening.       No current facility-administered medications for this visit.    Allergies:    Allergies  Allergen Reactions  . Aspirin Swelling    Only high doses of aspirin (given for arthritis).; "swelled face, lips, etc"  . Shellfish Allergy Anaphylaxis, Nausea And Vomiting and Swelling    Social History:  The patient  reports that he quit smoking about 26 years ago. His smoking use included Cigarettes. He has a 76 pack-year smoking history. He has never used smokeless tobacco. He reports that he drinks alcohol. He reports that he does not use illicit drugs.   Family History:  The patient's family history includes Heart disease in his mother.   ROS:  Please see the history of present illness.  No nausea, vomiting.  No fevers, chills.  No focal weakness.  No dysuria. Joint pain  All other systems reviewed and negative.   PHYSICAL EXAM: VS:  BP 108/60  Pulse 106  Ht 5\' 6"  (1.676 m)  Wt 130 lb (58.968 kg)  BMI 20.99 kg/m2 Well nourished, well developed, in no acute distress HEENT: normal Neck: no JVD, no carotid bruits Cardiac:  normal S1, S2; RRR;  Lungs:  clear to auscultation bilaterally, no wheezing, rhonchi or rales Abd: soft, nontender, no hepatomegaly Ext: no edema Skin: warm and dry Neuro:   no focal abnormalities noted      ASSESSMENT AND PLAN:  Coronary atherosclerosis of native coronary artery  Continue Aspir-81 Tablet Delayed Release, 81 MG, 1 tablet, Orally, twice a day Continue Plavix Tablet, 75 MG, 1 tablet, Orally, Once a day Refill Nitroglycerin 0.4 mg tablet, 0.4 mg, 1 tablet as directed, SL, as directed prn chest pain, 30 days, 25, Refills 3 IMAGING: EKG   Overton,Shana 04/11/2013 10:42:18 AM > Kendarrius Tanzi,JAY 04/11/2013 11:01:18  AM > NSR, no ST segment changes   Notes: Angina resolved after most recent LAD intervention. No bleeding.  exertional dyspnea resolved. Sx resolved after PPI.  He walked a lot in [place] recently without any symptoms.   2. Essential hypertension, benign  Continue Lisinopril Tablet, 5 MG, 1 tablet, Orally, Once a day, 90, Refills 3 Notes: COntrolled at home. Continue to check at home.  Refill 3. Chronic diastolic (congestive) heart failure   Notes: CXR at the [place] showed vascular congestion. Diuretic was tried but no improvement in [facility]. Increased BNP.  Better with PPI.  Preoperative clearance:  OK to stop plavix for 5 days prior to shoulder surgery, whenever he plans it.  No further cardiac testing needed at this time unless he develops any sx.   Carotid disease: moderate bilaterally.  Serial Dopplers.  Hyperlipidemia: Fasting lipid panel in the near future.   Signed, Fredric Mare, MD, [facility] 06/17/2013 11:36 AM

## 2013-06-17 NOTE — Patient Instructions (Signed)
Your physician recommends that you continue on your current medications as directed. Please refer to the Current Medication list given to you today.  Your physician wants you to follow-up in: 6 months with Dr. Eldridge Dace. You will receive a reminder letter in the mail two months in advance. If you don't receive a letter, please call our office to schedule the follow-up appointment.  Your physician recommends that you return for lab work on 07/06/13 FASTING for 12 hrs. You can have black coffee and water.  Refilled Nitro and Lisinopril to [facility].

## 2013-07-06 ENCOUNTER — Encounter: Payer: Self-pay | Admitting: Cardiovascular Disease

## 2013-07-06 ENCOUNTER — Ambulatory Visit (HOSPITAL_COMMUNITY): Payer: Medicare Other | Attending: Cardiovascular Disease

## 2013-07-06 ENCOUNTER — Other Ambulatory Visit (INDEPENDENT_AMBULATORY_CARE_PROVIDER_SITE_OTHER): Payer: Medicare Other

## 2013-07-06 DIAGNOSIS — I658 Occlusion and stenosis of other precerebral arteries: Secondary | ICD-10-CM | POA: Insufficient documentation

## 2013-07-06 DIAGNOSIS — E782 Mixed hyperlipidemia: Secondary | ICD-10-CM

## 2013-07-06 DIAGNOSIS — I6529 Occlusion and stenosis of unspecified carotid artery: Secondary | ICD-10-CM

## 2013-07-06 DIAGNOSIS — I10 Essential (primary) hypertension: Secondary | ICD-10-CM | POA: Insufficient documentation

## 2013-07-06 DIAGNOSIS — I2510 Atherosclerotic heart disease of native coronary artery without angina pectoris: Secondary | ICD-10-CM | POA: Insufficient documentation

## 2013-07-06 LAB — LIPID PANEL
HDL: 41.5 mg/dL (ref >39.00)
LDL Cholesterol: 65 mg/dL (ref 0–99)
Total CHOL/HDL Ratio: 3
Triglycerides: 92 mg/dL (ref 0.0–149.0)

## 2013-07-06 LAB — HEPATIC FUNCTION PANEL
Albumin: 3.1 g/dL — ABNORMAL LOW (ref 3.5–5.2)
Alkaline Phosphatase: 60 U/L (ref 39–117)

## 2013-07-07 ENCOUNTER — Encounter (HOSPITAL_COMMUNITY): Payer: Self-pay | Admitting: Interventional Cardiology

## 2013-07-13 ENCOUNTER — Encounter: Payer: Self-pay | Admitting: Cardiology

## 2013-11-16 ENCOUNTER — Encounter: Payer: Self-pay | Admitting: Interventional Cardiology

## 2013-11-25 ENCOUNTER — Encounter: Payer: Self-pay | Admitting: Interventional Cardiology

## 2013-12-05 ENCOUNTER — Encounter: Payer: Self-pay | Admitting: Interventional Cardiology

## 2013-12-13 ENCOUNTER — Encounter: Payer: Self-pay | Admitting: Interventional Cardiology

## 2014-01-11 ENCOUNTER — Ambulatory Visit: Payer: Medicare Other | Admitting: Interventional Cardiology

## 2014-01-31 ENCOUNTER — Encounter: Payer: Self-pay | Admitting: Interventional Cardiology

## 2014-01-31 ENCOUNTER — Ambulatory Visit (INDEPENDENT_AMBULATORY_CARE_PROVIDER_SITE_OTHER): Payer: Medicare Other | Admitting: Interventional Cardiology

## 2014-01-31 VITALS — BP 140/59 | HR 44 | Ht 66.0 in | Wt 134.8 lb

## 2014-01-31 DIAGNOSIS — I2510 Atherosclerotic heart disease of native coronary artery without angina pectoris: Secondary | ICD-10-CM

## 2014-01-31 DIAGNOSIS — N189 Chronic kidney disease, unspecified: Secondary | ICD-10-CM

## 2014-01-31 DIAGNOSIS — I10 Essential (primary) hypertension: Secondary | ICD-10-CM

## 2014-01-31 DIAGNOSIS — R0602 Shortness of breath: Secondary | ICD-10-CM

## 2014-01-31 DIAGNOSIS — E7800 Pure hypercholesterolemia, unspecified: Secondary | ICD-10-CM

## 2014-01-31 LAB — BASIC METABOLIC PANEL
BUN: 35 mg/dL — AB (ref 6–23)
CO2: 27 mEq/L (ref 19–32)
Calcium: 9 mg/dL (ref 8.4–10.5)
Chloride: 106 mEq/L (ref 96–112)
Creatinine, Ser: 1.7 mg/dL — ABNORMAL HIGH (ref 0.4–1.5)
GFR: 41.46 mL/min — AB (ref >60.00)
Glucose, Bld: 86 mg/dL (ref 70–99)
POTASSIUM: 4.7 meq/L (ref 3.5–5.1)
SODIUM: 137 meq/L (ref 135–145)

## 2014-01-31 LAB — BRAIN NATRIURETIC PEPTIDE: Pro B Natriuretic peptide (BNP): 178 pg/mL — ABNORMAL HIGH (ref 0.0–100.0)

## 2014-01-31 MED ORDER — LISINOPRIL 5 MG PO TABS: 5.0000 mg | ORAL_TABLET | Freq: Every morning | ORAL | Status: DC

## 2014-01-31 MED ORDER — NITROGLYCERIN 0.4 MG SL SUBL: 0.4000 mg | SUBLINGUAL_TABLET | SUBLINGUAL | Status: AC | PRN

## 2014-01-31 MED ORDER — ISOSORBIDE MONONITRATE ER 30 MG PO TB24: 30.0000 mg | ORAL_TABLET | Freq: Every day | ORAL | Status: DC

## 2014-01-31 NOTE — Patient Instructions (Signed)
Your physician has recommended you make the following change in your medication:   1. Start Imdur 30 mg 1 tablet daily.   Your physician recommends that you return for lab work today for BNP and BMet.   Your physician has requested that you have an echocardiogram. Echocardiography is a painless test that uses sound waves to create images of your heart. It provides your doctor with information about the size and shape of your heart and how well your heart's chambers and valves are working. This procedure takes approximately one hour. There are no restrictions for this procedure.   Your physician recommends that you schedule a follow-up appointment in: 3 months with Dr. Eldridge Dace.

## 2014-01-31 NOTE — Progress Notes (Signed)
Patient ID: [name] [name], male   DOB: 1934/10/20, 79 y.o.   MRN: [id number]    [address] [place], [place]  [address] Phone: [phone] Fax:  [phone]  Date:  01/31/2014   ID:  [name] [name], DOB 08-May-1935, MRN [id number]  PCP:  Georgann Housekeeper, MD      History of Present Illness: [name] [name] is a 79 y.o. male  who has had CAD. He had an LAD intervention in 7/13. Reports dyspnea with exertion and dizziness in the morning with changes in position. Dizziness usually goes away after OTC med. Still walks some and works in the yard occasionally. Goes fishing and camping once a month for at least a week. Has to take frequent breaks secondary to dyspnea. SBP is around 120 at home.   CAD/ASCVD:  Denies: Chest pain.  Leg edema.  Nitroglycerin.  Orthopnea.  PND Palpitations.   DOE after using a hoe in the garden for 20 minutes.  Walking makes his [facility] and he can feel his heart accelerating.  He thinks it may be too fast.  At times, he notes that his HR is slow and sometimes it is too fast.  He lacks energy.   Wt Readings from Last 3 Encounters:  01/31/14 134 lb 12.8 oz (61.145 kg)  06/17/13 130 lb (58.968 kg)  03/05/12 140 lb 6.9 oz (63.7 kg)     Past Medical History  Diagnosis Date  . Rheumatoid arthritis(714.0)   . Coronary artery disease   . High cholesterol   . Hypertension   . Pneumonia     "has had walking pneumonia twice"  . Exertional dyspnea 03/04/12    "last few months"  . Hypothyroidism   . Chronic kidney disease 03/04/12    "running ~ 40%"    Current Outpatient Prescriptions  Medication Sig Dispense Refill  . Acetaminophen (TYLENOL PO) Take 1 tablet by mouth as needed. For pain.      Marland Kitchen etanercept (ENBREL) 50 MG/ML injection Inject 25 mg into the skin 2 (two) times a week. On Saturdays and Wednesdays.      . fish oil-omega-3 fatty acids 1000 MG capsule Take 1 g by mouth daily.      Marland Kitchen levothyroxine (SYNTHROID, LEVOTHROID) 75 MCG tablet Take 75 mcg  by mouth every morning.      Marland Kitchen lisinopril (PRINIVIL,ZESTRIL) 5 MG tablet Take 1 tablet (5 mg total) by mouth every morning.  90 tablet  3  . Multiple Vitamin (MULITIVITAMIN WITH MINERALS) TABS Take 1 tablet by mouth daily.      . nitroGLYCERIN (NITROSTAT) 0.4 MG SL tablet Place 1 tablet (0.4 mg total) under the tongue every 5 (five) minutes x 3 doses as needed. For chest pain.  25 tablet  5  . pantoprazole (PROTONIX) 40 MG tablet Take 40 mg by mouth daily.      . predniSONE (DELTASONE) 5 MG tablet Take 5 mg by mouth every morning.      . simvastatin (ZOCOR) 40 MG tablet Take 40 mg by mouth every evening.       No current facility-administered medications for this visit.    Allergies:    Allergies  Allergen Reactions  . Aspirin Swelling    Only high doses of aspirin (given for arthritis).; "swelled face, lips, etc"  . Shellfish Allergy Anaphylaxis, Nausea And Vomiting and Swelling    Social History:  The patient  reports that he quit smoking about 26 years ago. His smoking use included Cigarettes. He  has a 76 pack-year smoking history. He has never used smokeless tobacco. He reports that he drinks alcohol. He reports that he does not use illicit drugs.   Family History:  The patient's family history includes Heart disease in his mother.   ROS:  Please see the history of present illness.  No nausea, vomiting.  No fevers, chills.  No focal weakness.  No dysuria. Joint pain  All other systems reviewed and negative.   PHYSICAL EXAM: VS:  BP 140/59  Pulse 44  Ht 5\' 6"  (1.676 m)  Wt 134 lb 12.8 oz (61.145 kg)  BMI 21.77 kg/m2 Well nourished, well developed, in no acute distress HEENT: normal Neck: no JVD, no carotid bruits Cardiac:  normal S1, S2; RRR;  Lungs:  clear to auscultation bilaterally, no wheezing, rhonchi or rales Abd: soft, nontender, no hepatomegaly Ext: no edema; joint deformities Skin: warm and dry Neuro:   no focal abnormalities noted      ASSESSMENT AND  PLAN:  Coronary atherosclerosis of native coronary artery  Continue Aspir-81 Tablet Delayed Release, 81 MG, 1 tablet, Orally, twice a day Continue Plavix Tablet, 75 MG, 1 tablet, Orally, Once a day Refill Nitroglycerin 0.4 mg tablet, 0.4 mg, 1 tablet as directed, SL, as directed prn chest pain, 30 days, 25, Refills 3 IMAGING: EKG   [name],[name] 04/11/2013 10:42:18 AM > VARANASI,JAY 04/11/2013 11:01:18 AM > NSR, no ST segment changes   Notes: Angina resolved after most recent LAD intervention. No bleeding.  exertional dyspnea resolved. Sx resolved after PPI.  He is going to 04/13/2013 and will look for symptoms.   2. Essential hypertension, benign  Continue Lisinopril Tablet, 5 MG, 1 tablet, Orally, Once a day, 90, Refills 3 Notes: COntrolled at home. Continue to check at home.  Refill 3. Chronic diastolic (congestive) heart failure   Notes: CXR at the [place] showed vascular congestion in 2013. Diuretic was tried but no improvement in [facility]. Increased BNP.  Better with PPI. After elevated BNP in 2013, did start Lasix but he had renal insufficiency.  Preoperative clearance:  OK to stop plavix for 5 days prior to shoulder surgery, whenever he plans it.  No further cardiac testing needed at this time unless he develops any sx. He is going to postpone this due to his wife's bladder cancer.  Carotid disease: moderate bilaterally.  Serial Dopplers.  Hyperlipidemia: Fasting lipid panel in the near future.  Labs sometimes checked at the 2014.LDL 65 in 11/14.  SHOB: Unclear if this is from fluid overload or if this is an anginal equivalent. Start imdur 30 mg daily. He is hesitant to do a heart catheterization especially given his renal insufficiency. He is not feel like his current symptoms are the same as what he had prior to either of his angioplasties.  His exercise tolerance is still fairly good for his age. He is carrying 50 pound bags of fertilizer. At times he is carrying 100 pound bags of fertilizer. He  can use a hoe in the garden for 20 minutes consecutively before he has to take a break. Will check BNP and the meds. Check echocardiogram to evaluate left ventricular function.  Signed, 12/14, MD, [facility] 01/31/2014 8:12 AM

## 2014-02-21 ENCOUNTER — Ambulatory Visit (HOSPITAL_COMMUNITY): Payer: Medicare Other | Attending: Cardiology | Admitting: Radiology

## 2014-02-21 DIAGNOSIS — I059 Rheumatic mitral valve disease, unspecified: Secondary | ICD-10-CM | POA: Insufficient documentation

## 2014-02-21 DIAGNOSIS — I2510 Atherosclerotic heart disease of native coronary artery without angina pectoris: Secondary | ICD-10-CM | POA: Diagnosis present

## 2014-02-21 DIAGNOSIS — R42 Dizziness and giddiness: Secondary | ICD-10-CM | POA: Insufficient documentation

## 2014-02-21 DIAGNOSIS — E785 Hyperlipidemia, unspecified: Secondary | ICD-10-CM | POA: Insufficient documentation

## 2014-02-21 DIAGNOSIS — Z87891 Personal history of nicotine dependence: Secondary | ICD-10-CM | POA: Diagnosis not present

## 2014-02-21 DIAGNOSIS — R0609 Other forms of dyspnea: Secondary | ICD-10-CM | POA: Diagnosis not present

## 2014-02-21 DIAGNOSIS — R0989 Other specified symptoms and signs involving the circulatory and respiratory systems: Secondary | ICD-10-CM | POA: Diagnosis not present

## 2014-02-21 DIAGNOSIS — I25119 Atherosclerotic heart disease of native coronary artery with unspecified angina pectoris: Secondary | ICD-10-CM

## 2014-02-21 DIAGNOSIS — I679 Cerebrovascular disease, unspecified: Secondary | ICD-10-CM | POA: Diagnosis not present

## 2014-02-21 DIAGNOSIS — R0602 Shortness of breath: Secondary | ICD-10-CM | POA: Diagnosis not present

## 2014-02-21 NOTE — Progress Notes (Signed)
Echocardiogram performed.  

## 2014-02-23 ENCOUNTER — Telehealth: Payer: Self-pay | Admitting: Interventional Cardiology

## 2014-02-23 NOTE — Telephone Encounter (Signed)
Returned pts call x2, left VM

## 2014-02-23 NOTE — Telephone Encounter (Signed)
Returned pt's call.

## 2014-02-23 NOTE — Telephone Encounter (Signed)
Returned pts call. See Echo results.  

## 2014-02-23 NOTE — Telephone Encounter (Signed)
New message     Want test results,

## 2014-02-23 NOTE — Telephone Encounter (Signed)
New message  ° ° °Returning call back to nurse.  °

## 2014-04-11 ENCOUNTER — Telehealth: Payer: Self-pay | Admitting: Interventional Cardiology

## 2014-04-11 NOTE — Telephone Encounter (Signed)
New Message  Pt called... Requests a call back.. No further details// please assist

## 2014-04-11 NOTE — Telephone Encounter (Signed)
lmtrc

## 2014-04-12 NOTE — Telephone Encounter (Addendum)
Pt called stating his Bp was running 90-80's/50's and pt cut his lisinopril 5 mg from 1/2 tablet down to 1/4 tablet (med list states 1 tablet, but he states he has always taken 1/2 tablet of the 5 mg). He states he was having dizziness, cramps in his legs and a cough on the 1/2 tablet. Bp is now running 150's/70's on the 1/4 tablet of the lisinopril 5 mg. Pt is feeling better but he is still having a cough at times and slight dizziness.

## 2014-04-12 NOTE — Telephone Encounter (Signed)
lmtrc

## 2014-04-13 NOTE — Telephone Encounter (Signed)
lmtrc

## 2014-04-13 NOTE — Telephone Encounter (Signed)
Stop lisinopril. Start amlodipine 2.5 mg daily. Check BP several times /week.

## 2014-04-17 MED ORDER — AMLODIPINE BESYLATE 2.5 MG PO TABS: 2.5000 mg | ORAL_TABLET | Freq: Every day | ORAL | Status: DC

## 2014-04-17 NOTE — Telephone Encounter (Signed)
Pt notified and rx sent in. Pt will continue to monitor BP.

## 2014-05-04 ENCOUNTER — Encounter: Payer: Self-pay | Admitting: Interventional Cardiology

## 2014-05-04 ENCOUNTER — Ambulatory Visit (INDEPENDENT_AMBULATORY_CARE_PROVIDER_SITE_OTHER): Payer: Medicare Other | Admitting: Interventional Cardiology

## 2014-05-04 VITALS — BP 140/82 | HR 75 | Ht 66.5 in | Wt 137.0 lb

## 2014-05-04 DIAGNOSIS — I10 Essential (primary) hypertension: Secondary | ICD-10-CM

## 2014-05-04 DIAGNOSIS — I2510 Atherosclerotic heart disease of native coronary artery without angina pectoris: Secondary | ICD-10-CM

## 2014-05-04 DIAGNOSIS — E7800 Pure hypercholesterolemia, unspecified: Secondary | ICD-10-CM

## 2014-05-04 DIAGNOSIS — R0609 Other forms of dyspnea: Secondary | ICD-10-CM

## 2014-05-04 DIAGNOSIS — R0989 Other specified symptoms and signs involving the circulatory and respiratory systems: Secondary | ICD-10-CM

## 2014-05-04 MED ORDER — AMLODIPINE BESYLATE 2.5 MG PO TABS: 2.5000 mg | ORAL_TABLET | Freq: Every day | ORAL | Status: AC

## 2014-05-04 NOTE — Progress Notes (Signed)
Patient ID: [name] [name], male   DOB: 1934/08/25, 79 y.o.   MRN: [id number] Patient ID: [name] [name], male   DOB: June 22, 1935, 79 y.o.   MRN: [id number]    [address] [place], [place]  [address] Phone: [phone] Fax:  [phone]  Date:  05/04/2014   ID:  [name] [name], DOB 03/05/1935, MRN [id number]  PCP:  Georgann Housekeeper, MD      History of Present Illness: [name] [name] is a 79 y.o. male  who has had CAD. He had an LAD intervention in 7/13. Reports dyspnea with exertion and dizziness in the morning with changes in position. Dizziness usually goes away after OTC med for motion sickness. Still walks some and works in the yard occasionally. Goes fishing and camping once a month for at least a week. Has to take frequent breaks secondary to dyspnea. SBP is around 120 at home.   CAD/ASCVD:  Denies: Chest pain.  Leg edema.  Nitroglycerin.  Orthopnea.  PND Palpitations.   DOE after using a hoe in the garden for 20 minutes.  Walking makes his [facility] and he can feel his heart accelerating.  He thinks it may be too fast.  At times, he notes that his HR is slow and sometimes it is too fast.  He lacks energy.   He had low BP with lisinopril.  We stopped lisinopril and added low dose amlodipine.  BP has been in the 130s systolic typically.  He remains very active at this house.  He climbed a ladder yesterday.  No chest pain.  SOme DOE with going up the ladder.  He will rest and have improvement of his sx.     Wt Readings from Last 3 Encounters:  05/04/14 137 lb (62.143 kg)  01/31/14 134 lb 12.8 oz (61.145 kg)  06/17/13 130 lb (58.968 kg)     Past Medical History  Diagnosis Date  . Rheumatoid arthritis(714.0)   . Coronary artery disease   . High cholesterol   . Hypertension   . Pneumonia     "has had walking pneumonia twice"  . Exertional dyspnea 03/04/12    "last few months"  . Hypothyroidism   . Chronic kidney disease 03/04/12    "running ~ 40%"    Current  Outpatient Prescriptions  Medication Sig Dispense Refill  . Acetaminophen (TYLENOL PO) Take 1 tablet by mouth as needed. For pain.      Marland Kitchen amLODipine (NORVASC) 2.5 MG tablet Take 1 tablet (2.5 mg total) by mouth daily.  30 tablet  6  . etanercept (ENBREL) 50 MG/ML injection Inject 25 mg into the skin 2 (two) times a week. On Saturdays and Wednesdays.      . fish oil-omega-3 fatty acids 1000 MG capsule Take 1 g by mouth daily.      . isosorbide mononitrate (IMDUR) 30 MG 24 hr tablet Take 1 tablet (30 mg total) by mouth daily.  90 tablet  3  . levothyroxine (SYNTHROID, LEVOTHROID) 75 MCG tablet Take 75 mcg by mouth every morning.      . Multiple Vitamin (MULITIVITAMIN WITH MINERALS) TABS Take 1 tablet by mouth daily.      . nitroGLYCERIN (NITROSTAT) 0.4 MG SL tablet Place 1 tablet (0.4 mg total) under the tongue every 5 (five) minutes x 3 doses as needed. For chest pain.  25 tablet  5  . pantoprazole (PROTONIX) 40 MG tablet Take 40 mg by mouth daily.      . predniSONE (DELTASONE) 5 MG  tablet Take 5 mg by mouth every morning.      . simvastatin (ZOCOR) 40 MG tablet Take 40 mg by mouth every evening.       No current facility-administered medications for this visit.    Allergies:    Allergies  Allergen Reactions  . Aspirin Swelling    Only high doses of aspirin (given for arthritis).; "swelled face, lips, etc"  . Shellfish Allergy Anaphylaxis, Nausea And Vomiting and Swelling  . Lisinopril Cough    Social History:  The patient  reports that he quit smoking about 27 years ago. His smoking use included Cigarettes. He has a 76 pack-year smoking history. He has never used smokeless tobacco. He reports that he drinks alcohol. He reports that he does not use illicit drugs.   Family History:  The patient's family history includes Heart disease in his mother.   ROS:  Please see the history of present illness.  No nausea, vomiting.  No fevers, chills.  No focal weakness.  No dysuria. Joint pain  All  other systems reviewed and negative.   PHYSICAL EXAM: VS:  BP 140/82  Pulse 75  Ht 5' 6.5" (1.689 m)  Wt 137 lb (62.143 kg)  BMI 21.78 kg/m2 Well nourished, well developed, in no acute distress HEENT: normal Neck: no JVD, no carotid bruits Cardiac:  normal S1, S2; RRR;  Lungs:  clear to auscultation bilaterally, no wheezing, rhonchi or rales Abd: soft, nontender, no hepatomegaly Ext: no edema; joint deformities Skin: warm and dry Neuro:   no focal abnormalities noted      ECG: NSR, NSST  ASSESSMENT AND PLAN:  Coronary atherosclerosis of native coronary artery  Continue Aspir-81 Tablet Delayed Release, 81 MG, 1 tablet, Orally, twice a day Continue Plavix Tablet, 75 MG, 1 tablet, Orally, Once a day Refill Nitroglycerin 0.4 mg tablet, 0.4 mg, 1 tablet as directed, SL, as directed prn chest pain, 30 days, 25, Refills 3 IMAGING: EKG   Overton,Shana 04/11/2013 10:42:18 AM > Nickcole Bralley,JAY 04/11/2013 11:01:18 AM > NSR, no ST segment changes   Notes: Angina resolved after most recent LAD intervention. No bleeding.  exertional dyspnea resolved. Sx resolved after PPI.  He is going to [place] and will look for symptoms.   2. Essential hypertension, benign  Stoppped Lisinopril Tablet, 5 MG, 1 tablet, Orally, Once a day, 90, Refills 3; BP better on amlodipine. Notes: COntrolled at home. Continue to check at home.   3. Chronic diastolic (congestive) heart failure   Notes: CXR at the [place] showed vascular congestion in 2013. Diuretic was tried but no improvement in [facility]. Increased BNP.  Better with PPI. After elevated BNP in 2013, did start Lasix but he had renal insufficiency.  Preoperative clearance:  OK to stop plavix for 5 days prior to shoulder surgery, whenever he plans it.  No further cardiac testing needed at this time unless he develops any sx. He is going to postpone this due to his wife's bladder cancer.  Carotid disease: moderate bilaterally.  Serial Dopplers.  Hyperlipidemia:  Fasting lipid panel in the near future.  Labs sometimes checked at the [place].LDL 65 in 11/14.  SHOB: Unclear if this is from fluid overload or if this is an anginal equivalent. Started imdur 30 mg daily- legs feel better. He is hesitant to do a heart catheterization especially given his renal insufficiency. He is not feel like his current symptoms are the same as what he had prior to either of his angioplasties.  His exercise tolerance is  still fairly good for his age. He is carrying 50 pound bags of fertilizer. At times he is carrying 100 pound bags of fertilizer. He can use a hoe in the garden for 20 minutes consecutively before he has to take a break.  BNP was slightly elevated.  Normal LV function by echo in 7/15.  WOuld not plan any ischemia w/u at this time.  No sx like what he had before his stents.   Signed, Fredric Mare, MD, [facility] 05/04/2014 1:53 PM

## 2014-05-04 NOTE — Patient Instructions (Signed)
Your physician recommends that you continue on your current medications as directed. Please refer to the Current Medication list given to you today.  Your physician wants you to follow-up in: 6 months with Dr. Varanasi.  You will receive a reminder letter in the mail two months in advance. If you don't receive a letter, please call our office to schedule the follow-up appointment.  

## 2014-07-06 ENCOUNTER — Other Ambulatory Visit (INDEPENDENT_AMBULATORY_CARE_PROVIDER_SITE_OTHER): Payer: Medicare Other | Admitting: *Deleted

## 2014-07-06 ENCOUNTER — Other Ambulatory Visit (HOSPITAL_COMMUNITY): Payer: Self-pay | Admitting: *Deleted

## 2014-07-06 DIAGNOSIS — I6523 Occlusion and stenosis of bilateral carotid arteries: Secondary | ICD-10-CM

## 2014-07-06 DIAGNOSIS — E7800 Pure hypercholesterolemia, unspecified: Secondary | ICD-10-CM

## 2014-07-06 DIAGNOSIS — I10 Essential (primary) hypertension: Secondary | ICD-10-CM

## 2014-07-06 LAB — HEPATIC FUNCTION PANEL
ALK PHOS: 73 U/L (ref 39–117)
ALT: 17 U/L (ref 0–53)
AST: 25 U/L (ref 0–37)
Albumin: 3.2 g/dL — ABNORMAL LOW (ref 3.5–5.2)
BILIRUBIN DIRECT: 0 mg/dL (ref 0.0–0.3)
BILIRUBIN TOTAL: 0.5 mg/dL (ref 0.2–1.2)
Total Protein: 6.9 g/dL (ref 6.0–8.3)

## 2014-07-06 LAB — LIPID PANEL
Cholesterol: 124 mg/dL (ref 0–200)
HDL: 42.9 mg/dL (ref >39.00)
LDL Cholesterol: 47 mg/dL (ref 0–99)
NonHDL: 81.1
Total CHOL/HDL Ratio: 3
Triglycerides: 169 mg/dL — ABNORMAL HIGH (ref 0.0–149.0)
VLDL: 33.8 mg/dL (ref 0.0–40.0)

## 2014-07-27 ENCOUNTER — Encounter (HOSPITAL_COMMUNITY): Payer: Self-pay | Admitting: Interventional Cardiology

## 2014-07-27 ENCOUNTER — Ambulatory Visit (HOSPITAL_COMMUNITY): Payer: Medicare Other | Attending: Internal Medicine | Admitting: *Deleted

## 2014-07-27 DIAGNOSIS — I6523 Occlusion and stenosis of bilateral carotid arteries: Secondary | ICD-10-CM | POA: Insufficient documentation

## 2014-07-27 NOTE — Progress Notes (Signed)
Carotid duplex complete 

## 2014-07-28 ENCOUNTER — Telehealth: Payer: Self-pay | Admitting: Interventional Cardiology

## 2014-07-28 NOTE — Telephone Encounter (Signed)
Returned patient's call. Reported carotid duplex results that showed stable disease, per Dr. Eldridge Dace. Patient verbalized understanding, no questions at this time.

## 2014-07-28 NOTE — Telephone Encounter (Signed)
New Msg   Pt returning the call. Please contact at [phone]

## 2014-10-25 ENCOUNTER — Telehealth: Payer: Self-pay | Admitting: Interventional Cardiology

## 2014-10-25 NOTE — Telephone Encounter (Signed)
New message     Request for surgical clearance:  What type of surgery is being performed? Shoulder surgery 1. When is this surgery scheduled? no  2. Are there any medications that need to be held prior to surgery and how long? plavix---also need medical clearance  3. Name of physician performing surgery?  Dr Malon Kindle  4. What is your office phone and fax number?    Do not know number

## 2014-10-25 NOTE — Telephone Encounter (Signed)
Will forward to Dr. Eldridge Dace for review. Patient was last seen 04/2014.  1) How long can he be off plavix 2) needs cardiac clearance

## 2014-10-26 NOTE — Telephone Encounter (Signed)
F/U       Pt calling states he would like a call back today and nurse already has info.    Please call.

## 2014-10-26 NOTE — Telephone Encounter (Signed)
Ok to hold plavix 5 days prior to surgery. No further cardiac testing needed before surgery if he is feeling well from a cardiac standpoint

## 2014-10-26 NOTE — Telephone Encounter (Signed)
Left message to call back  

## 2014-10-26 NOTE — Telephone Encounter (Signed)
Spoke with pt and call was regarding surgical clearance.  Pt made aware Dr. Eldridge Dace is not in office today but information has been sent to him and we will call him back after Dr. Eldridge Dace reviews.

## 2014-10-27 NOTE — Telephone Encounter (Signed)
Spoke with pt in regards to surgical clearance. Informed pt to hold Plavix 5 days prior to surgery once scheduled. Pt states that he has been feeling well, denies any SOB, CP, or dizziness. Informed pt that I would fax information to Dr. Dietrich Pates office.  Pt verbalized understanding and was in agreement with this plan.

## 2014-10-31 ENCOUNTER — Ambulatory Visit (INDEPENDENT_AMBULATORY_CARE_PROVIDER_SITE_OTHER): Payer: Medicare Other | Admitting: Interventional Cardiology

## 2014-10-31 ENCOUNTER — Encounter: Payer: Self-pay | Admitting: Interventional Cardiology

## 2014-10-31 VITALS — BP 118/60 | HR 43 | Ht 66.5 in | Wt 137.0 lb

## 2014-10-31 DIAGNOSIS — I2510 Atherosclerotic heart disease of native coronary artery without angina pectoris: Secondary | ICD-10-CM | POA: Diagnosis not present

## 2014-10-31 DIAGNOSIS — I10 Essential (primary) hypertension: Secondary | ICD-10-CM

## 2014-10-31 DIAGNOSIS — Z01810 Encounter for preprocedural cardiovascular examination: Secondary | ICD-10-CM

## 2014-10-31 MED ORDER — ISOSORBIDE MONONITRATE ER 30 MG PO TB24: 30.0000 mg | ORAL_TABLET | Freq: Every day | ORAL | Status: AC

## 2014-10-31 NOTE — Progress Notes (Signed)
Patient ID: [name] [name], male   DOB: 1935-08-18, 80 y.o.   MRN: [id number]    [address] [place], [place]  [address] Phone: [phone] Fax:  [phone]  Date:  10/31/2014   ID:  [name] [name], DOB 06-06-1935, MRN [id number]  PCP:  Georgann Housekeeper, MD      History of Present Illness: [name] [name] is a 80 y.o. male  who has had CAD. He had an LAD intervention in 7/13. Reports dyspnea with exertion and dizziness in the morning with changes in position. Dizziness usually goes away after OTC med for motion sickness. Still walks some and works in the yard occasionally. Goes fishing and camping once a month for at least a week. Has to take frequent breaks secondary to dyspnea. SBP is around 120 at home.   CAD/ASCVD:  Denies: Chest pain.  Leg edema.  Nitroglycerin.  Orthopnea.  PND Palpitations.   DOE after using a hoe in the garden for 20 minutes.  Walking makes his [facility] and he can feel his heart accelerating.  He thinks it may be too fast.  At times, he notes that his HR is slow and sometimes it is too fast.  He lacks energy.   He had low BP with lisinopril.  We stopped lisinopril and added low dose amlodipine.  BP has been in the 130s systolic typically.  He remains very active at this house.  He climbed a ladder yesterday.  No chest pain.  SOme DOE with going up the ladder.  He will rest and have improvement of his sx.     Wt Readings from Last 3 Encounters:  10/31/14 137 lb (62.143 kg)  05/04/14 137 lb (62.143 kg)  01/31/14 134 lb 12.8 oz (61.145 kg)     Past Medical History  Diagnosis Date  . Rheumatoid arthritis(714.0)   . Coronary artery disease   . High cholesterol   . Hypertension   . Pneumonia     "has had walking pneumonia twice"  . Exertional dyspnea 03/04/12    "last few months"  . Hypothyroidism   . Chronic kidney disease 03/04/12    "running ~ 40%"    Current Outpatient Prescriptions  Medication Sig Dispense Refill  . Acetaminophen (TYLENOL  PO) Take 1 tablet by mouth as needed. For pain.    Marland Kitchen amLODipine (NORVASC) 2.5 MG tablet Take 1 tablet (2.5 mg total) by mouth daily. 90 tablet 3  . aspirin 81 MG tablet Take 81 mg by mouth daily.    . clopidogrel (PLAVIX) 75 MG tablet Take 75 mg by mouth daily.    Marland Kitchen etanercept (ENBREL) 50 MG/ML injection Inject 25 mg into the skin 2 (two) times a week. On Saturdays and Wednesdays.    . fish oil-omega-3 fatty acids 1000 MG capsule Take 1 g by mouth daily.    . isosorbide mononitrate (IMDUR) 30 MG 24 hr tablet Take 1 tablet (30 mg total) by mouth daily. 90 tablet 3  . levothyroxine (SYNTHROID, LEVOTHROID) 75 MCG tablet Take 75 mcg by mouth every morning.    . Multiple Vitamin (MULITIVITAMIN WITH MINERALS) TABS Take 1 tablet by mouth daily.    . nitroGLYCERIN (NITROSTAT) 0.4 MG SL tablet Place 1 tablet (0.4 mg total) under the tongue every 5 (five) minutes x 3 doses as needed. For chest pain. 25 tablet 5  . predniSONE (DELTASONE) 5 MG tablet Take 5 mg by mouth every morning.    . ranitidine (ZANTAC) 150 MG capsule Take 150  mg by mouth 2 (two) times daily.    . simvastatin (ZOCOR) 40 MG tablet Take 40 mg by mouth every evening.     No current facility-administered medications for this visit.    Allergies:    Allergies  Allergen Reactions  . Aspirin Swelling    Only high doses of aspirin (given for arthritis).; "swelled face, lips, etc"  . Shellfish Allergy Anaphylaxis, Nausea And Vomiting and Swelling  . Lisinopril Cough  . Pantoprazole     dizzy    Social History:  The patient  reports that he quit smoking about 27 years ago. His smoking use included Cigarettes. He has a 76 pack-year smoking history. He has never used smokeless tobacco. He reports that he drinks alcohol. He reports that he does not use illicit drugs.   Family History:  The patient's family history includes Heart disease in his mother.   ROS:  Please see the history of present illness.  No nausea, vomiting.  No fevers,  chills.  No focal weakness.  No dysuria. Joint pain  All other systems reviewed and negative.   PHYSICAL EXAM: VS:  BP 118/60 mmHg  Pulse 43  Ht 5' 6.5" (1.689 m)  Wt 137 lb (62.143 kg)  BMI 21.78 kg/m2 Well nourished, well developed, in no acute distress HEENT: normal Neck: no JVD, no carotid bruits Cardiac:  normal S1, S2; RRR; premature beats Lungs:  clear to auscultation bilaterally, no wheezing, rhonchi or rales Abd: soft, nontender, no hepatomegaly Ext: no edema; joint deformities Skin: warm and dry Neuro:   no focal abnormalities noted Psych: normal affect       ASSESSMENT AND PLAN:  Coronary atherosclerosis of native coronary artery /preoperative eval Continue Aspir-81 Tablet Delayed Release, 81 MG, 1 tablet, Orally, twice a day Continue Plavix Tablet, 75 MG, 1 tablet, Orally, Once a day Refill Nitroglycerin 0.4 mg tablet, 0.4 mg, 1 tablet as directed, SL, as directed prn chest pain, 30 days, 25, Refills 3   Notes: Angina resolved after most recent LAD intervention. No bleeding.  exertional dyspnea improved.   He is going to have shoulder surgery.    Preoperative clearance:  OK to stop plavix for 5 days prior to shoulder surgery, whenever he plans it.  No further cardiac testing needed at this time unless he develops any sx. If needed, stop aspirin for a week prior. He is at low to medium risk (<3%) of perioperative cardiac event due to his age and other comorbidities, but these cannot be modified.  2. Essential hypertension, benign  Stopped Lisinopril Tablet, 5 MG, 1 tablet, Orally, Once a day, 90, Refills 3; BP better on amlodipine. Notes: COntrolled at home. Continue to check at home.  Home readings are like what he had today.  3. Chronic diastolic (congestive) heart failure   Notes: CXR at the [place] showed vascular congestion in 2013. Diuretic was tried but no improvement in [facility] and renal insufficieny occured.    Carotid disease: moderate bilaterally.  Serial  Dopplers.  Hyperlipidemia: Fasting lipid panel in the near future.  Labs sometimes checked at the [place].LDL 65 in 11/14.  SHOB:  His exercise tolerance is still fairly good for his age. He is carrying 50 pound bags of fertilizer. At times he is carrying 100 pound bags of fertilizer. He can use a hoe in the garden for 20 minutes consecutively before he has to take a break.  BNP was slightly elevated.  Normal LV function by echo in 7/15.  WOuld not  plan any ischemia w/u at this time.  No sx like what he had before his stents.   Signed, Fredric Mare, MD, [facility] 10/31/2014 12:24 PM

## 2014-10-31 NOTE — Patient Instructions (Signed)
Your physician recommends that you continue on your current medications as directed. Please refer to the Current Medication list given to you today.  Your physician wants you to follow-up in: 6 months with Dr. Varanasi.  You will receive a reminder letter in the mail two months in advance. If you don't receive a letter, please call our office to schedule the follow-up appointment.  

## 2015-01-01 NOTE — Pre-Procedure Instructions (Signed)
[name] [name]  01/01/2015   Your procedure is scheduled on:  Fri, May 27 @ 1:00  PM  Report to [facility]  at 11:00 AM.  Call this number if you have problems the morning of surgery: [phone]   Remember:   Do not eat food or drink liquids after midnight.   Take these medicines the morning of surgery with A SIP OF WATER: Amlodipine(Norvasc),Isosorbide(Imdur),Synthroid(Levothyroxine),and Prednisone(Deltasone)              Stop taking your Aspirin,Fish Oil,and Plavix a week prior to surgery. No Goody's,BC's,Aleve,Ibuprofen,or any Herbal Medications.    Do not wear jewelry.  Do not wear lotions, powders, or colognes. You may wear deodorant.  Men may shave face and neck.  Do not bring valuables to the hospital.  [facility] is not responsible                  for any belongings or valuables.               Contacts, dentures or bridgework may not be worn into surgery.  Leave suitcase in the car. After surgery it may be brought to your room.  For patients admitted to the hospital, discharge time is determined by your                treatment team.                  Special Instructions:   - Preparing for Surgery  Before surgery, you can play an important role.  Because skin is not sterile, your skin needs to be as free of germs as possible.  You can reduce the number of germs on you skin by washing with CHG (chlorahexidine gluconate) soap before surgery.  CHG is an antiseptic cleaner which kills germs and bonds with the skin to continue killing germs even after washing.  Please DO NOT use if you have an allergy to CHG or antibacterial soaps.  If your skin becomes reddened/irritated stop using the CHG and inform your nurse when you arrive at Short Stay.  Do not shave (including legs and underarms) for at least 48 hours prior to the first CHG shower.  You may shave your face.  Please follow these instructions carefully:   1.  Shower with CHG Soap the night before  surgery and the                                morning of Surgery.  2.  If you choose to wash your hair, wash your hair first as usual with your       normal shampoo.  3.  After you shampoo, rinse your hair and body thoroughly to remove the                      Shampoo.  4.  Use CHG as you would any other liquid soap.  You can apply chg directly       to the skin and wash gently with scrungie or a clean washcloth.  5.  Apply the CHG Soap to your body ONLY FROM THE NECK DOWN.        Do not use on open wounds or open sores.  Avoid contact with your eyes,       ears, mouth and genitals (private parts).  Wash genitals (private parts)  with your normal soap.  6.  Wash thoroughly, paying special attention to the area where your surgery        will be performed.  7.  Thoroughly rinse your body with warm water from the neck down.  8.  DO NOT shower/wash with your normal soap after using and rinsing off       the CHG Soap.  9.  Pat yourself dry with a clean towel.            10.  Wear clean pajamas.            11.  Place clean sheets on your bed the night of your first shower and do not        sleep with pets.  Day of Surgery  Do not apply any lotions/deoderants the morning of surgery.  Please wear clean clothes to the hospital/surgery center.     Please read over the following fact sheets that you were given: Pain Booklet, Coughing and Deep Breathing, MRSA Information and Surgical Site Infection Prevention

## 2015-01-02 ENCOUNTER — Encounter (HOSPITAL_COMMUNITY)
Admission: RE | Admit: 2015-01-02 | Discharge: 2015-01-02 | Disposition: A | Discharge status: Home / Self Care | Payer: Medicare Other | Source: Ambulatory Visit | Attending: Orthopedic Surgery | Admitting: Orthopedic Surgery

## 2015-01-02 ENCOUNTER — Encounter (HOSPITAL_COMMUNITY): Payer: Self-pay

## 2015-01-02 DIAGNOSIS — Z87891 Personal history of nicotine dependence: Secondary | ICD-10-CM | POA: Insufficient documentation

## 2015-01-02 DIAGNOSIS — I129 Hypertensive chronic kidney disease with stage 1 through stage 4 chronic kidney disease, or unspecified chronic kidney disease: Secondary | ICD-10-CM | POA: Diagnosis not present

## 2015-01-02 DIAGNOSIS — Z7982 Long term (current) use of aspirin: Secondary | ICD-10-CM | POA: Insufficient documentation

## 2015-01-02 DIAGNOSIS — Z01818 Encounter for other preprocedural examination: Secondary | ICD-10-CM | POA: Diagnosis present

## 2015-01-02 DIAGNOSIS — Z955 Presence of coronary angioplasty implant and graft: Secondary | ICD-10-CM | POA: Diagnosis not present

## 2015-01-02 DIAGNOSIS — M19011 Primary osteoarthritis, right shoulder: Secondary | ICD-10-CM | POA: Diagnosis not present

## 2015-01-02 DIAGNOSIS — M069 Rheumatoid arthritis, unspecified: Secondary | ICD-10-CM | POA: Insufficient documentation

## 2015-01-02 DIAGNOSIS — Z01812 Encounter for preprocedural laboratory examination: Secondary | ICD-10-CM | POA: Diagnosis not present

## 2015-01-02 DIAGNOSIS — Z7902 Long term (current) use of antithrombotics/antiplatelets: Secondary | ICD-10-CM | POA: Insufficient documentation

## 2015-01-02 DIAGNOSIS — I2510 Atherosclerotic heart disease of native coronary artery without angina pectoris: Secondary | ICD-10-CM | POA: Insufficient documentation

## 2015-01-02 DIAGNOSIS — N189 Chronic kidney disease, unspecified: Secondary | ICD-10-CM | POA: Insufficient documentation

## 2015-01-02 HISTORY — DX: Umbilical hernia without obstruction or gangrene: K42.9

## 2015-01-02 LAB — CBC
HCT: 42.3 % (ref 39.0–52.0)
Hemoglobin: 13.3 g/dL (ref 13.0–17.0)
MCH: 26.6 pg (ref 26.0–34.0)
MCHC: 31.4 g/dL (ref 30.0–36.0)
MCV: 84.6 fL (ref 78.0–100.0)
Platelets: 152 10*3/uL (ref 150–400)
RBC: 5 MIL/uL (ref 4.22–5.81)
RDW: 17.8 % — AB (ref 11.5–15.5)
WBC: 10.5 10*3/uL (ref 4.0–10.5)

## 2015-01-02 LAB — BASIC METABOLIC PANEL
ANION GAP: 11 (ref 5–15)
BUN: 44 mg/dL — ABNORMAL HIGH (ref 6–20)
CHLORIDE: 107 mmol/L (ref 101–111)
CO2: 24 mmol/L (ref 22–32)
Calcium: 9.3 mg/dL (ref 8.9–10.3)
Creatinine, Ser: 1.84 mg/dL — ABNORMAL HIGH (ref 0.61–1.24)
GFR calc Af Amer: 39 mL/min — ABNORMAL LOW (ref >60)
GFR calc non Af Amer: 34 mL/min — ABNORMAL LOW (ref >60)
GLUCOSE: 119 mg/dL — AB (ref 65–99)
Potassium: 4.9 mmol/L (ref 3.5–5.1)
SODIUM: 142 mmol/L (ref 135–145)

## 2015-01-02 LAB — SURGICAL PCR SCREEN
MRSA, PCR: NEGATIVE
Staphylococcus aureus: NEGATIVE

## 2015-01-02 NOTE — Progress Notes (Signed)
   01/02/15 1048  OBSTRUCTIVE SLEEP APNEA  Have you ever been diagnosed with sleep apnea through a sleep study? No  Do you snore loudly (loud enough to be heard through closed doors)?  0  Do you often feel tired, fatigued, or sleepy during the daytime? 0  Has anyone observed you stop breathing during your sleep? 0  Do you have, or are you being treated for high blood pressure? 1  BMI more than 35 kg/m2? 0  Age over 78 years old? 1  Neck circumference greater than 40 cm/16 inches? 0 (15 1/2)  Gender: 1

## 2015-01-02 NOTE — H&P (Signed)
[name] [name] is an 80 y.o. male.    Chief Complaint: right shoulder pain and weakness  HPI: Pt is a 80 y.o. male complaining of right shoulder pain for multiple years. Pain had continually increased since the beginning. X-rays in the clinic show end-stage arthritic changes of the right shoulder. Pt has tried various conservative treatments which have failed to alleviate their symptoms, including injections and therapy. Various options are discussed with the patient. Risks, benefits and expectations were discussed with the patient. Patient understand the risks, benefits and expectations and wishes to proceed with surgery.   PCP:  Wenda Low, MD  D/C Plans: Home  PMH: Past Medical History  Diagnosis Date  . Rheumatoid arthritis(714.0)   . Coronary artery disease   . High cholesterol   . Hypertension   . Pneumonia     "has had walking pneumonia twice"  . Exertional dyspnea 03/04/12    "last few months"  . Hypothyroidism   . Chronic kidney disease 03/04/12    "running ~ 40%"  . Umbilical hernia     PSH: Past Surgical History  Procedure Laterality Date  . Coronary angioplasty with stent placement  2007; 2010    "2; 1"  . Coronary angioplasty with stent placement  03/04/12    "2; total of 5 now"  . Tonsillectomy  1935  . Appendectomy  1956  . Total knee arthroplasty  2000    bilaterally  . Total hip arthroplasty      bilaterally  . Revision total hip arthroplasty      right; "4 times; it jumped out and had to put it back in twice; redid it @ least once""  . Total shoulder arthroplasty      left  . Foot surgery      "both; arthritis; cut ~ all the bones in my feet were crooked; couldn't walk"  . Cataract extraction w/ intraocular lens  implant, bilateral    . Left heart catheterization with coronary angiogram N/A 03/04/2012    Procedure: LEFT HEART CATHETERIZATION WITH CORONARY ANGIOGRAM;  Surgeon: Jettie Booze, MD;  Location[facility] CATH LAB;  Service: Cardiovascular;   Laterality: N/A;  . Joint replacement      Social History:  reports that he quit smoking about 27 years ago. His smoking use included Cigarettes. He has a 76 pack-year smoking history. He has never used smokeless tobacco. He reports that he does not drink alcohol or use illicit drugs.  Allergies:  Allergies  Allergen Reactions  . Aspirin Swelling    Only high doses of aspirin (given for arthritis).; "swelled face, lips, etc"  . Shellfish Allergy Anaphylaxis, Nausea And Vomiting and Swelling  . Lisinopril Cough  . Pantoprazole Other (See Comments)    dizzy    Medications: No current facility-administered medications for this encounter.   Current Outpatient Prescriptions  Medication Sig Dispense Refill  . acetaminophen (TYLENOL) 650 MG CR tablet Take 650 mg by mouth 2 (two) times daily as needed for pain.    Marland Kitchen amLODipine (NORVASC) 2.5 MG tablet Take 1 tablet (2.5 mg total) by mouth daily. (Patient taking differently: Take 2.5 mg by mouth daily after supper. ) 90 tablet 3  . aspirin EC 81 MG tablet Take 81 mg by mouth at bedtime.    . clopidogrel (PLAVIX) 75 MG tablet Take 75 mg by mouth daily after supper.     . Etanercept 25 MG/0.5ML SOSY Inject 25 mg into the skin 2 (two) times a week. Wednesday or Thursday and  Saturday or Sunday (ENBREL)    . fish oil-omega-3 fatty acids 1000 MG capsule Take 1 g by mouth daily with supper.     . isosorbide mononitrate (IMDUR) 30 MG 24 hr tablet Take 1 tablet (30 mg total) by mouth daily. (Patient taking differently: Take 30 mg by mouth daily after supper. ) 90 tablet 3  . levothyroxine (SYNTHROID, LEVOTHROID) 100 MCG tablet Take 100 mcg by mouth daily before breakfast.    . Multiple Vitamin (MULITIVITAMIN WITH MINERALS) TABS Take 1 tablet by mouth daily.    . nitroGLYCERIN (NITROSTAT) 0.4 MG SL tablet Place 1 tablet (0.4 mg total) under the tongue every 5 (five) minutes x 3 doses as needed. For chest pain. (Patient taking differently: Place 0.4 mg  under the tongue every 5 (five) minutes x 3 doses as needed for chest pain. ) 25 tablet 5  . predniSONE (DELTASONE) 5 MG tablet Take 5 mg by mouth daily with breakfast.     . simvastatin (ZOCOR) 40 MG tablet Take 40 mg by mouth at bedtime.       Results for orders placed or performed during the hospital encounter of 01/02/15 (from the past 48 hour(s))  Basic metabolic panel     Status: Abnormal   Collection Time: 01/02/15 11:03 AM  Result Value Ref Range   Sodium 142 135 - 145 mmol/L   Potassium 4.9 3.5 - 5.1 mmol/L   Chloride 107 101 - 111 mmol/L   CO2 24 22 - 32 mmol/L   Glucose, Bld 119 (H) 65 - 99 mg/dL   BUN 44 (H) 6 - 20 mg/dL   Creatinine, Ser 1.84 (H) 0.61 - 1.24 mg/dL   Calcium 9.3 8.9 - 10.3 mg/dL   GFR calc non Af Amer 34 (L) >60 mL/min   GFR calc Af Amer 39 (L) >60 mL/min    Comment: (NOTE) The eGFR has been calculated using the CKD EPI equation. This calculation has not been validated in all clinical situations. eGFR's persistently <60 mL/min signify possible Chronic Kidney Disease.    Anion gap 11 5 - 15  CBC     Status: Abnormal   Collection Time: 01/02/15 11:03 AM  Result Value Ref Range   WBC 10.5 4.0 - 10.5 K/uL   RBC 5.00 4.22 - 5.81 MIL/uL   Hemoglobin 13.3 13.0 - 17.0 g/dL   HCT 42.3 39.0 - 52.0 %   MCV 84.6 78.0 - 100.0 fL   MCH 26.6 26.0 - 34.0 pg   MCHC 31.4 30.0 - 36.0 g/dL   RDW 17.8 (H) 11.5 - 15.5 %   Platelets 152 150 - 400 K/uL  Surgical pcr screen     Status: None   Collection Time: 01/02/15 11:03 AM  Result Value Ref Range   MRSA, PCR NEGATIVE NEGATIVE   Staphylococcus aureus NEGATIVE NEGATIVE    Comment:        The Xpert SA Assay (FDA approved for NASAL specimens in patients over 46 years of age), is one component of a comprehensive surveillance program.  Test performance has been validated by [facility] for patients greater than or equal to 20 year old. It is not intended to diagnose infection nor to guide or monitor  treatment.    No results found.  ROS: Pain with rom of the right upper extremity  Physical Exam:  Alert and oriented 80 y.o. male in no acute distress Cranial nerves 2-12 intact Cervical spine: full rom with no tenderness, nv intact distally Chest: active  breath sounds bilaterally, no wheeze rhonchi or rales Heart: regular rate and rhythm, no murmur Abd: non tender non distended with active bowel sounds Hip is stable with rom  Right shoulder with moderate weakness and pain with rom nv intact distally No rashes or edema  Assessment/Plan Assessment: right shoulder rotator cuff insufficiency  Plan: Patient will undergo a right reverse total shoulder by Dr. Veverly Fells at [facility]. Risks benefits and expectations were discussed with the patient. Patient understand risks, benefits and expectations and wishes to proceed.

## 2015-01-03 NOTE — Progress Notes (Addendum)
Anesthesia Chart Review:  Pt is 80 year old male scheduled for R reverse total shoulder arthroplasty on 01/12/2015 with Dr. Ranell Patrick.   [facility] to [facility] for urgent care needs. Cardiologist is Dr. Eldridge Dace, last office visit 05/04/2014.  PMH includes: CAD (stenting 2007, 2010, 2013), HTN, RA, CKD. Former smoker. BMI 22.  Medications include: ASA, plavix. Pt is to hold plavix 01/07/2015 for surgery.   Preoperative labs reviewed.  Cr 1.84. Results for last 2 year range from 1.5-1.7.   EKG 05/01/2014: NSR. Nonspecific T wave abnormality.   Carotid duplex US 07/27/2014:  -Heterogeneous plaque, bilaterally. -Stable, 40-59% RICA stenosis. -Stable, 1-39% LICA stenosis. -Normal subclavian arteries, bilaterally. -Patent vertebral arteries with antegrade flow.  Echo 02/21/2014: - Left ventricle: The cavity size was normal. There was mild concentric hypertrophy. Systolic function was normal. The estimated ejection fraction was in the range of 60% to 65%. Wall motion was normal; there were no regional wall motion abnormalities. Doppler parameters are consistent with abnormal left ventricular relaxation (grade 1 diastolic dysfunction). There was no evidence of elevated ventricular filling pressure byDoppler parameters. - Aortic valve: Trileaflet; normal thickness leaflets. There was no regurgitation. - Mitral valve: Calcified annulus. There was mild regurgitation. - Left atrium: The atrium was normal in size. - Right ventricle: Systolic function was normal. - Right atrium: The atrium was normal in size. - Tricuspid valve: Transvalvular velocity was within the normal range. There was no regurgitation. - Pericardium, extracardiac: There was no pericardial effusion.  Cardiac cath 03/04/2012: 1. Patent left main coronary artery. 2. Successful overlapping drug-eluting stent placement in the proximal to mid left anterior descending artery with a 3.0 x 32 Promus elements and 3.5 x 16 Promus  element stents. These were placed to treat in-stent restenosis in the mid LAD and de novo coronary artery artery disease in the proximal LAD.  3. Patent stent in the first obtuse marginal.  4. Patent stent in the mid to distal right coronary artery with 40-50% in-stent restenosis. Moderate distal RCA disease, but patent prior Rotablator result.  5.  LVEDP 18 mmHg. Ejection fraction not assessed.  Pt has cardiac clearance from Dr. Eldridge Dace for procedure in telephone encounter in Epic dated 10/26/2014.   If no changes, I anticipate pt can proceed with surgery as scheduled.   Rica Mast, FNP-BC [facility] Short Stay Surgical Center/Anesthesiology Phone: [phone] 01/03/2015 1:56 PM  Addendum: I was notified by Short Stay Surgical nursing staff that patient was admitted for CAP on 01/06/15.  He remains hospitalized at [facility]. Notes indicate he is on O2, Levaquin, and higher dose prednisone.  I have notified Danielle at Dr. Ranell Patrick' office regarding patient's admission so he can follow-up.  Based on notes, it would seem his surgery will need to be postponed, however, I have not personally examined the patient. Duwayne Heck will address with Dr. Ranell Patrick so he can follow-up with patient and/or the Hospitalist.   Velna Ochs [facility] Short Stay Center/Anesthesiology Phone [phone] 01/08/2015 9:59 AM

## 2015-01-06 ENCOUNTER — Ambulatory Visit (INDEPENDENT_AMBULATORY_CARE_PROVIDER_SITE_OTHER): Payer: Medicare Other | Admitting: Family Medicine

## 2015-01-06 ENCOUNTER — Encounter (HOSPITAL_COMMUNITY): Payer: Self-pay | Admitting: Emergency Medicine

## 2015-01-06 ENCOUNTER — Other Ambulatory Visit (HOSPITAL_COMMUNITY): Payer: Self-pay

## 2015-01-06 ENCOUNTER — Ambulatory Visit (INDEPENDENT_AMBULATORY_CARE_PROVIDER_SITE_OTHER): Payer: Medicare Other

## 2015-01-06 ENCOUNTER — Inpatient Hospital Stay (HOSPITAL_COMMUNITY)
Admission: EM | Admit: 2015-01-06 | Discharge: 2015-01-08 | DRG: 193 | Disposition: A | Discharge status: Home / Self Care | Payer: Medicare Other | Attending: Internal Medicine | Admitting: Internal Medicine

## 2015-01-06 ENCOUNTER — Other Ambulatory Visit: Payer: Self-pay

## 2015-01-06 VITALS — BP 138/52 | HR 57 | Temp 97.5°F | Resp 30

## 2015-01-06 DIAGNOSIS — M75100 Unspecified rotator cuff tear or rupture of unspecified shoulder, not specified as traumatic: Secondary | ICD-10-CM | POA: Diagnosis present

## 2015-01-06 DIAGNOSIS — Z96643 Presence of artificial hip joint, bilateral: Secondary | ICD-10-CM | POA: Diagnosis present

## 2015-01-06 DIAGNOSIS — Z886 Allergy status to analgesic agent status: Secondary | ICD-10-CM | POA: Diagnosis not present

## 2015-01-06 DIAGNOSIS — I2510 Atherosclerotic heart disease of native coronary artery without angina pectoris: Secondary | ICD-10-CM | POA: Diagnosis present

## 2015-01-06 DIAGNOSIS — R5381 Other malaise: Secondary | ICD-10-CM | POA: Diagnosis not present

## 2015-01-06 DIAGNOSIS — R6883 Chills (without fever): Secondary | ICD-10-CM

## 2015-01-06 DIAGNOSIS — Z955 Presence of coronary angioplasty implant and graft: Secondary | ICD-10-CM

## 2015-01-06 DIAGNOSIS — E785 Hyperlipidemia, unspecified: Secondary | ICD-10-CM | POA: Diagnosis present

## 2015-01-06 DIAGNOSIS — E039 Hypothyroidism, unspecified: Secondary | ICD-10-CM | POA: Diagnosis present

## 2015-01-06 DIAGNOSIS — R0602 Shortness of breath: Secondary | ICD-10-CM

## 2015-01-06 DIAGNOSIS — Z87891 Personal history of nicotine dependence: Secondary | ICD-10-CM | POA: Diagnosis not present

## 2015-01-06 DIAGNOSIS — M069 Rheumatoid arthritis, unspecified: Secondary | ICD-10-CM | POA: Diagnosis present

## 2015-01-06 DIAGNOSIS — R1110 Vomiting, unspecified: Secondary | ICD-10-CM

## 2015-01-06 DIAGNOSIS — J189 Pneumonia, unspecified organism: Principal | ICD-10-CM | POA: Diagnosis present

## 2015-01-06 DIAGNOSIS — Z7902 Long term (current) use of antithrombotics/antiplatelets: Secondary | ICD-10-CM | POA: Diagnosis not present

## 2015-01-06 DIAGNOSIS — I129 Hypertensive chronic kidney disease with stage 1 through stage 4 chronic kidney disease, or unspecified chronic kidney disease: Secondary | ICD-10-CM | POA: Diagnosis present

## 2015-01-06 DIAGNOSIS — Z96612 Presence of left artificial shoulder joint: Secondary | ICD-10-CM | POA: Diagnosis present

## 2015-01-06 DIAGNOSIS — R0989 Other specified symptoms and signs involving the circulatory and respiratory systems: Secondary | ICD-10-CM

## 2015-01-06 DIAGNOSIS — J9601 Acute respiratory failure with hypoxia: Secondary | ICD-10-CM | POA: Diagnosis present

## 2015-01-06 DIAGNOSIS — Z9841 Cataract extraction status, right eye: Secondary | ICD-10-CM | POA: Diagnosis not present

## 2015-01-06 DIAGNOSIS — Z96653 Presence of artificial knee joint, bilateral: Secondary | ICD-10-CM | POA: Diagnosis present

## 2015-01-06 DIAGNOSIS — Z888 Allergy status to other drugs, medicaments and biological substances status: Secondary | ICD-10-CM

## 2015-01-06 DIAGNOSIS — Z961 Presence of intraocular lens: Secondary | ICD-10-CM | POA: Diagnosis present

## 2015-01-06 DIAGNOSIS — N184 Chronic kidney disease, stage 4 (severe): Secondary | ICD-10-CM | POA: Diagnosis present

## 2015-01-06 DIAGNOSIS — Z9842 Cataract extraction status, left eye: Secondary | ICD-10-CM | POA: Diagnosis not present

## 2015-01-06 DIAGNOSIS — Z7952 Long term (current) use of systemic steroids: Secondary | ICD-10-CM

## 2015-01-06 DIAGNOSIS — K219 Gastro-esophageal reflux disease without esophagitis: Secondary | ICD-10-CM | POA: Diagnosis present

## 2015-01-06 DIAGNOSIS — E780 Pure hypercholesterolemia: Secondary | ICD-10-CM | POA: Diagnosis present

## 2015-01-06 LAB — COMPREHENSIVE METABOLIC PANEL
ALT: 22 U/L (ref 17–63)
AST: 40 U/L (ref 15–41)
Albumin: 3.1 g/dL — ABNORMAL LOW (ref 3.5–5.0)
Alkaline Phosphatase: 66 U/L (ref 38–126)
Anion gap: 13 (ref 5–15)
BILIRUBIN TOTAL: 1.2 mg/dL (ref 0.3–1.2)
BUN: 37 mg/dL — AB (ref 6–20)
CO2: 20 mmol/L — AB (ref 22–32)
CREATININE: 2.04 mg/dL — AB (ref 0.61–1.24)
Calcium: 8.7 mg/dL — ABNORMAL LOW (ref 8.9–10.3)
Chloride: 104 mmol/L (ref 101–111)
GFR calc Af Amer: 34 mL/min — ABNORMAL LOW (ref >60)
GFR calc non Af Amer: 30 mL/min — ABNORMAL LOW (ref >60)
GLUCOSE: 100 mg/dL — AB (ref 65–99)
POTASSIUM: 4 mmol/L (ref 3.5–5.1)
SODIUM: 137 mmol/L (ref 135–145)
TOTAL PROTEIN: 7.9 g/dL (ref 6.5–8.1)

## 2015-01-06 LAB — POCT CBC
Granulocyte percent: 81.9 %G — AB (ref 37–80)
HCT, POC: 44.3 % (ref 43.5–53.7)
Hemoglobin: 13.9 g/dL — AB (ref 14.1–18.1)
Lymph, poc: 1 (ref 0.6–3.4)
MCH, POC: 25.5 pg — AB (ref 27–31.2)
MCHC: 31.2 g/dL — AB (ref 31.8–35.4)
MCV: 81.7 fL (ref 80–97)
MID (cbc): 0.6 (ref 0–0.9)
MPV: 7.1 fL (ref 0–99.8)
POC Granulocyte: 7.2 — AB (ref 2–6.9)
POC LYMPH PERCENT: 11.5 %L (ref 10–50)
POC MID %: 6.6 %M (ref 0–12)
Platelet Count, POC: 179 10*3/uL (ref 142–424)
RBC: 5.43 M/uL (ref 4.69–6.13)
RDW, POC: 18.7 %
WBC: 8.8 10*3/uL (ref 4.6–10.2)

## 2015-01-06 LAB — POCT URINALYSIS DIPSTICK
Glucose, UA: NEGATIVE
Leukocytes, UA: NEGATIVE
Nitrite, UA: NEGATIVE
Protein, UA: 100
Spec Grav, UA: 1.02
Urobilinogen, UA: 1
pH, UA: 5

## 2015-01-06 LAB — CBC WITH DIFFERENTIAL/PLATELET
BASOS ABS: 0 10*3/uL (ref 0.0–0.1)
BASOS PCT: 0 % (ref 0–1)
Eosinophils Absolute: 0 10*3/uL (ref 0.0–0.7)
Eosinophils Relative: 1 % (ref 0–5)
HEMATOCRIT: 43.4 % (ref 39.0–52.0)
HEMOGLOBIN: 14 g/dL (ref 13.0–17.0)
LYMPHS ABS: 0.8 10*3/uL (ref 0.7–4.0)
Lymphocytes Relative: 10 % — ABNORMAL LOW (ref 12–46)
MCH: 27 pg (ref 26.0–34.0)
MCHC: 32.3 g/dL (ref 30.0–36.0)
MCV: 83.6 fL (ref 78.0–100.0)
Monocytes Absolute: 2.1 10*3/uL — ABNORMAL HIGH (ref 0.1–1.0)
Monocytes Relative: 25 % — ABNORMAL HIGH (ref 3–12)
NEUTROS ABS: 5.3 10*3/uL (ref 1.7–7.7)
Neutrophils Relative %: 64 % (ref 43–77)
PLATELETS: 132 10*3/uL — AB (ref 150–400)
RBC: 5.19 MIL/uL (ref 4.22–5.81)
RDW: 17.6 % — AB (ref 11.5–15.5)
WBC: 8.3 10*3/uL (ref 4.0–10.5)

## 2015-01-06 LAB — I-STAT TROPONIN, ED: Troponin i, poc: 0.07 ng/mL (ref 0.00–0.08)

## 2015-01-06 LAB — POCT UA - MICROSCOPIC ONLY
Amorphous: POSITIVE
Casts, Ur, LPF, POC: NEGATIVE
Crystals, Ur, HPF, POC: NEGATIVE
Granular Cast: POSITIVE
Renal tubular cells: POSITIVE
Yeast, UA: NEGATIVE

## 2015-01-06 LAB — I-STAT CHEM 8, ED
BUN: 38 mg/dL — ABNORMAL HIGH (ref 6–20)
CREATININE: 1.9 mg/dL — AB (ref 0.61–1.24)
Calcium, Ion: 1.12 mmol/L — ABNORMAL LOW (ref 1.13–1.30)
Chloride: 106 mmol/L (ref 101–111)
Glucose, Bld: 97 mg/dL (ref 65–99)
HCT: 48 % (ref 39.0–52.0)
Hemoglobin: 16.3 g/dL (ref 13.0–17.0)
POTASSIUM: 4 mmol/L (ref 3.5–5.1)
SODIUM: 138 mmol/L (ref 135–145)
TCO2: 18 mmol/L (ref 0–100)

## 2015-01-06 LAB — I-STAT CG4 LACTIC ACID, ED
Lactic Acid, Venous: 1.88 mmol/L (ref 0.5–2.0)
Lactic Acid, Venous: 1.11 mmol/L (ref 0.5–2.0)

## 2015-01-06 LAB — STREP PNEUMONIAE URINARY ANTIGEN: Strep Pneumo Urinary Antigen: NEGATIVE

## 2015-01-06 MED ORDER — SODIUM CHLORIDE 0.9 % IV BOLUS (SEPSIS)
1000.0000 mL | Freq: Once | INTRAVENOUS | Status: AC
  Administered 2015-01-06: 1000 mL via INTRAVENOUS

## 2015-01-06 MED ORDER — SIMVASTATIN 40 MG PO TABS
40.0000 mg | ORAL_TABLET | Freq: Every day | ORAL | Status: DC
Start: 2015-01-06 — End: 2015-01-07
  Administered 2015-01-06: 40 mg via ORAL
  Filled 2015-01-06 (×2): qty 1

## 2015-01-06 MED ORDER — ONDANSETRON HCL 4 MG/2ML IJ SOLN
4.0000 mg | Freq: Four times a day (QID) | INTRAMUSCULAR | Status: DC | PRN
Start: 2015-01-06 — End: 2015-01-08

## 2015-01-06 MED ORDER — ISOSORBIDE MONONITRATE ER 30 MG PO TB24
30.0000 mg | ORAL_TABLET | Freq: Every day | ORAL | Status: DC
  Administered 2015-01-06 – 2015-01-08 (×3): 30 mg via ORAL
  Filled 2015-01-06 (×3): qty 1

## 2015-01-06 MED ORDER — LEVOFLOXACIN 750 MG PO TABS
750.0000 mg | ORAL_TABLET | ORAL | Status: DC
  Administered 2015-01-08: 750 mg via ORAL
  Filled 2015-01-06: qty 1

## 2015-01-06 MED ORDER — PREDNISONE 50 MG PO TABS
50.0000 mg | ORAL_TABLET | Freq: Every day | ORAL | Status: DC
  Administered 2015-01-07: 50 mg via ORAL
  Filled 2015-01-06 (×3): qty 1

## 2015-01-06 MED ORDER — AMLODIPINE BESYLATE 2.5 MG PO TABS
2.5000 mg | ORAL_TABLET | Freq: Every day | ORAL | Status: DC
  Administered 2015-01-06 – 2015-01-07 (×2): 2.5 mg via ORAL
  Filled 2015-01-06 (×3): qty 1

## 2015-01-06 MED ORDER — FAMOTIDINE 20 MG PO TABS
20.0000 mg | ORAL_TABLET | Freq: Every day | ORAL | Status: DC
  Administered 2015-01-06 – 2015-01-08 (×3): 20 mg via ORAL
  Filled 2015-01-06 (×3): qty 1

## 2015-01-06 MED ORDER — LEVOFLOXACIN IN D5W 750 MG/150ML IV SOLN
750.0000 mg | Freq: Once | INTRAVENOUS | Status: AC
  Administered 2015-01-06: 750 mg via INTRAVENOUS
  Filled 2015-01-06: qty 150

## 2015-01-06 MED ORDER — HEPARIN SODIUM (PORCINE) 5000 UNIT/ML IJ SOLN
5000.0000 [IU] | Freq: Three times a day (TID) | INTRAMUSCULAR | Status: DC
  Administered 2015-01-06 – 2015-01-08 (×5): 5000 [IU] via SUBCUTANEOUS
  Filled 2015-01-06 (×8): qty 1

## 2015-01-06 MED ORDER — NITROGLYCERIN 0.4 MG SL SUBL: 0.4000 mg | SUBLINGUAL_TABLET | SUBLINGUAL | Status: DC | PRN

## 2015-01-06 MED ORDER — ADULT MULTIVITAMIN W/MINERALS CH
1.0000 | ORAL_TABLET | Freq: Every day | ORAL | Status: DC
  Administered 2015-01-06 – 2015-01-08 (×3): 1 via ORAL
  Filled 2015-01-06 (×3): qty 1

## 2015-01-06 MED ORDER — SODIUM CHLORIDE 0.9 % IV SOLN
INTRAVENOUS | Status: AC
  Administered 2015-01-06: 18:00:00 via INTRAVENOUS

## 2015-01-06 MED ORDER — LEVOFLOXACIN IN D5W 500 MG/100ML IV SOLN: 500.0000 mg | INTRAVENOUS | Status: DC

## 2015-01-06 MED ORDER — LEVOFLOXACIN IN D5W 750 MG/150ML IV SOLN: 750.0000 mg | INTRAVENOUS | Status: DC

## 2015-01-06 MED ORDER — LEVOTHYROXINE SODIUM 100 MCG PO TABS
100.0000 ug | ORAL_TABLET | Freq: Every day | ORAL | Status: DC
  Administered 2015-01-07 – 2015-01-08 (×2): 100 ug via ORAL
  Filled 2015-01-06 (×3): qty 1

## 2015-01-06 NOTE — ED Provider Notes (Signed)
CSN: [id number]     Arrival date & time 01/06/15  1353 History   First MD Initiated Contact with Patient 01/06/15 1358     Chief Complaint  Patient presents with  . Pneumonia     (Consider location/radiation/quality/duration/timing/severity/associated sxs/prior Treatment) The history is provided by the patient.  [name] [name] is a 80 y.o. male hx of CAD, HTN, CHF, here presenting with chills, cough. Patient was in preop on Tuesday for right shoulder surgery. Patient states that afterwards he has been having chills. He also has some productive cough as well. Has some vomiting intermittently but no abdominal pain. Has a history of rheumatoid arthritis is currently on 50 mg of prednisone. He went to urgent care and was noticed to have a oxygen of 73% on his fingers. He states that his fingers is always white and this has not changed in color. EMS noticed oxygen is 94% on RA.     Past Medical History  Diagnosis Date  . Rheumatoid arthritis(714.0)   . Coronary artery disease   . High cholesterol   . Hypertension   . Pneumonia     "has had walking pneumonia twice"  . Exertional dyspnea 03/04/12    "last few months"  . Hypothyroidism   . Chronic kidney disease 03/04/12    "running ~ 40%"  . Umbilical hernia    Past Surgical History  Procedure Laterality Date  . Coronary angioplasty with stent placement  2007; 2010    "2; 1"  . Coronary angioplasty with stent placement  03/04/12    "2; total of 5 now"  . Tonsillectomy  1935  . Appendectomy  1956  . Total knee arthroplasty  2000    bilaterally  . Total hip arthroplasty      bilaterally  . Revision total hip arthroplasty      right; "4 times; it jumped out and had to put it back in twice; redid it @ least once""  . Total shoulder arthroplasty      left  . Foot surgery      "both; arthritis; cut ~ all the bones in my feet were crooked; couldn't walk"  . Cataract extraction w/ intraocular lens  implant, bilateral    . Left heart  catheterization with coronary angiogram N/A 03/04/2012    Procedure: LEFT HEART CATHETERIZATION WITH CORONARY ANGIOGRAM;  Surgeon: Corky Crafts, MD;  Location: [facility] CATH LAB;  Service: Cardiovascular;  Laterality: N/A;  . Joint replacement     Family History  Problem Relation Age of Onset  . Heart disease Mother    History  Substance Use Topics  . Smoking status: Former Smoker -- 2.00 packs/day for 38 years    Types: Cigarettes    Quit date: 02/16/1987  . Smokeless tobacco: Never Used  . Alcohol Use: No     Comment: 03/04/12 "last drink of alcohol 1980's; used to only drink socially"    Review of Systems  Constitutional: Positive for chills.  Respiratory: Positive for cough.   All other systems reviewed and are negative.     Allergies  Aspirin; Shellfish allergy; Lisinopril; and Pantoprazole  Home Medications   Prior to Admission medications   Medication Sig Start Date End Date Taking? Authorizing Provider  acetaminophen (TYLENOL) 650 MG CR tablet Take 650 mg by mouth 2 (two) times daily as needed for pain.    Historical Provider, MD  amLODipine (NORVASC) 2.5 MG tablet Take 1 tablet (2.5 mg total) by mouth daily. Patient taking differently: Take 2.5  mg by mouth daily after supper.  05/04/14   Corky Crafts, MD  aspirin EC 81 MG tablet Take 81 mg by mouth at bedtime.    Historical Provider, MD  clopidogrel (PLAVIX) 75 MG tablet Take 75 mg by mouth daily after supper.     Historical Provider, MD  Etanercept 25 MG/0.5ML SOSY Inject 25 mg into the skin 2 (two) times a week. Wednesday or Thursday and Saturday or Sunday (ENBREL)    Historical Provider, MD  fish oil-omega-3 fatty acids 1000 MG capsule Take 1 g by mouth daily with supper.     Historical Provider, MD  isosorbide mononitrate (IMDUR) 30 MG 24 hr tablet Take 1 tablet (30 mg total) by mouth daily. Patient taking differently: Take 30 mg by mouth daily after supper.  10/31/14   Corky Crafts, MD  levothyroxine  (SYNTHROID, LEVOTHROID) 100 MCG tablet Take 100 mcg by mouth daily before breakfast.    Historical Provider, MD  Multiple Vitamin (MULITIVITAMIN WITH MINERALS) TABS Take 1 tablet by mouth daily.    Historical Provider, MD  nitroGLYCERIN (NITROSTAT) 0.4 MG SL tablet Place 1 tablet (0.4 mg total) under the tongue every 5 (five) minutes x 3 doses as needed. For chest pain. Patient taking differently: Place 0.4 mg under the tongue every 5 (five) minutes x 3 doses as needed for chest pain.  01/31/14   Corky Crafts, MD  predniSONE (DELTASONE) 5 MG tablet Take 5 mg by mouth daily with breakfast.     Historical Provider, MD  ranitidine (ZANTAC) 150 MG tablet Take 150 mg by mouth 2 (two) times daily.    Historical Provider, MD  simvastatin (ZOCOR) 40 MG tablet Take 40 mg by mouth at bedtime.     Historical Provider, MD   BP 153/50 mmHg  Pulse 99  Temp(Src) 98.4 F (36.9 C) (Oral)  Resp 26  SpO2 94% Physical Exam  Constitutional: He is oriented to person, place, and time.  Chronically ill appearing   HENT:  Head: Normocephalic.  Mouth/Throat: Oropharynx is clear and moist.  Eyes: Conjunctivae are normal. Pupils are equal, round, and reactive to light.  Neck: Normal range of motion. Neck supple.  Cardiovascular: Regular rhythm and normal heart sounds.   Slightly tachy   Pulmonary/Chest:  Slightly tachypneic, bibasilar crackles worse on R   Abdominal: Soft. Bowel sounds are normal. He exhibits no distension. There is no tenderness. There is no rebound.  Musculoskeletal: Normal range of motion. He exhibits no edema or tenderness.  Neurological: He is alert and oriented to person, place, and time. No cranial nerve deficit. Coordination normal.  Skin: Skin is warm and dry.  Psychiatric: He has a normal mood and affect. His behavior is normal. Judgment and thought content normal.  Nursing note and vitals reviewed.   ED Course  Procedures (including critical care time) Labs Review Labs  Reviewed  CULTURE, BLOOD (ROUTINE X 2)  CULTURE, BLOOD (ROUTINE X 2)  CBC WITH DIFFERENTIAL/PLATELET  COMPREHENSIVE METABOLIC PANEL  I-STAT CG4 LACTIC ACID, ED  I-STAT TROPOININ, ED  I-STAT CHEM 8, ED    Imaging Review Dg Chest 2 View  01/06/2015   CLINICAL DATA:  Patient with shortness of breath and dizziness.  EXAM: CHEST  2 VIEW  COMPARISON:  None.  FINDINGS: Exam limited due to motion artifact. Enlarged cardiac and mediastinal contours with tortuosity of the thoracic aorta. Right-greater-than-left mid and lower lung coarse interstitial pulmonary opacities. Biapical pleural parenchymal thickening. No definite pleural effusion or pneumothorax.  Mid thoracic spine degenerative changes. Age-indeterminate wedge compression deformities of multiple lower thoracic vertebral bodies. Right glenohumeral joint degenerative changes. Left shoulder arthroplasty.  IMPRESSION: Bilateral mid and lower lung, right-greater-than-left, coarse interstitial opacities may represent combination of chronic changes versus acute superimposed infectious process, particularly in the absence of priors for comparison.  Multiple age-indeterminate wedge compression deformities of lower thoracic vertebral bodies. Recommend correlation for point tenderness.  Recommend short-term followup radiograph in 3-4 weeks to assess for interval change/resolution given the lack of prior comparisons.  These results will be called to the ordering clinician or representative by the Radiologist Assistant, and communication documented in the PACS or zVision Dashboard.   Electronically Signed   By: Annia Belt M.D.   On: 01/06/2015 13:06     EKG Interpretation None      MDM   Final diagnoses:  Shortness of breath    [name] [name] is a 80 y.o. male here with cough, chills. CXR at urgent care showed pneumonia. Will do sepsis workup.   3:58 PM WBC nl. Lactate nl. Has pneumonia clinically. Consider PE but Cr 2.0 so can't get CT angio. Given  levaquin. Will admit.      Richardean Canal, MD 01/06/15 [phone]

## 2015-01-06 NOTE — Progress Notes (Signed)
ANTIBIOTIC CONSULT NOTE - INITIAL  Pharmacy Consult for levaquin Indication: pneumonia  Allergies  Allergen Reactions  . Aspirin Swelling    Only high doses of aspirin (given for arthritis).; "swelled face, lips, etc"  . Shellfish Allergy Anaphylaxis, Nausea And Vomiting and Swelling  . Lisinopril Cough  . Pantoprazole Other (See Comments)    dizzy    Patient Measurements: Height: 5\' 7"  (170.2 cm) Weight: 136 lb 7.4 oz (61.9 kg) IBW/kg (Calculated) : 66.1 Adjusted Body Weight:   Vital Signs: Temp: 98.2 F (36.8 C) (05/21 1757) Temp Source: Oral (05/21 1757) BP: 120/79 mmHg (05/21 1757) Pulse Rate: 90 (05/21 1757) Intake/Output from previous day:   Intake/Output from this shift:    Labs:  Recent Labs  01/06/15 1240 01/06/15 1436 01/06/15 1447  WBC 8.8 8.3  --   HGB 13.9* 14.0 16.3  PLT  --  132*  --   CREATININE  --  2.04* 1.90*   Estimated Creatinine Clearance: 28.5 mL/min (by C-G formula based on Cr of 1.9). No results for input(s): VANCOTROUGH, VANCOPEAK, VANCORANDOM, GENTTROUGH, GENTPEAK, GENTRANDOM, TOBRATROUGH, TOBRAPEAK, TOBRARND, AMIKACINPEAK, AMIKACINTROU, AMIKACIN in the last 72 hours.   Microbiology: Recent Results (from the past 720 hour(s))  Surgical pcr screen     Status: None   Collection Time: 01/02/15 11:03 AM  Result Value Ref Range Status   MRSA, PCR NEGATIVE NEGATIVE Final   Staphylococcus aureus NEGATIVE NEGATIVE Final    Comment:        The Xpert SA Assay (FDA approved for NASAL specimens in patients over 57 years of age), is one component of a comprehensive surveillance program.  Test performance has been validated by [facility] for patients greater than or equal to 9 year old. It is not intended to diagnose infection nor to guide or monitor treatment.     Medical History: Past Medical History  Diagnosis Date  . Rheumatoid arthritis(714.0)   . Coronary artery disease   . High cholesterol   . Hypertension   . Pneumonia      "has had walking pneumonia twice"  . Exertional dyspnea 03/04/12    "last few months"  . Hypothyroidism   . Chronic kidney disease 03/04/12    "running ~ 40%"  . Umbilical hernia     Medications:  Scheduled:  . sodium chloride   Intravenous STAT  . amLODipine  2.5 mg Oral QPC supper  . famotidine  20 mg Oral Daily  . heparin  5,000 Units Subcutaneous 3 times per day  . isosorbide mononitrate  30 mg Oral Daily  . [START ON 01/08/2015] levofloxacin (LEVAQUIN) IV  500 mg Intravenous Q48H  . [START ON 01/07/2015] levothyroxine  100 mcg Oral QAC breakfast  . multivitamin with minerals  1 tablet Oral Daily  . [START ON 01/07/2015] predniSONE  50 mg Oral Q breakfast  . simvastatin  40 mg Oral QHS   Infusions:   Assessment: 80 yo who came in for likely CAP. Pt also has CRI. Baseline scr is 1.9 today. Levaquin has been ordered for him. He got 750mg  in the ED.   Plan:   Levaquin 750mg  PO q48 F/u with renal fxn  62, PharmD Pager: [phone] 01/06/2015 6:27 PM

## 2015-01-06 NOTE — Progress Notes (Addendum)
Subjective:  This chart was scribed for [name] Sidle, MD by [name] [name], Medical Scribe. This patient was seen in Room 1 and the patient's care was started at 11:59 AM.   Patient ID: [name] [name], male    DOB: 03/11/1935, 80 y.o.   MRN: [id number]  HPI HPI Comments: [name] [name] is a 80 y.o. male with a PMHX of rheumatoid arthritis, CAD, and PNA. who presents to [facility] complaining of  Nausea and emesis, gradual onset four days ago. Patient reports having symptoms of SOB, being shaky, chills, feet pain, and vomiting. He is not presenting with the normal fevers that are usually acompanied by his arthritis flare ups. He notes his last vomiting episode being this morning. Patient denies having diarrhea, sore throat, change in vision,bowel problems, or abdominal pain. He has extensive PMSHx.   Due for a shoulder replacement in Friday  PCP is Dr. Richardson Landry   Review of Systems  Constitutional: Positive for fever and chills.  HENT: Negative for sore throat.   Respiratory: Positive for shortness of breath.   Gastrointestinal: Positive for vomiting. Negative for abdominal pain and diarrhea.  Musculoskeletal: Positive for arthralgias.       Objective:   Physical Exam  Constitutional: He is oriented to person, place, and time. He appears well-developed and well-nourished. No distress.  Alert   HENT:  Head: Normocephalic and atraumatic.  Mouth/Throat: Oropharynx is clear and moist. No oropharyngeal exudate.  Wearing a hearing aid coated tongue with mild erythema in post oropharynx  Eyes: Pupils are equal, round, and reactive to light.  Neck: Normal range of motion. Neck supple.  Cardiovascular: Normal rate.   No murmur heard. Irregular pulse  Pulmonary/Chest: Effort normal.  Distant breath sounds  Abdominal: Soft. Bowel sounds are normal. He exhibits no distension. There is no tenderness. There is no rebound and no guarding.  Musculoskeletal: He exhibits no  edema.  extremities show very severe rheumatoid arthritis changes. subluxation of joints in both hands  Neurological: He is alert and oriented to person, place, and time. No cranial nerve deficit.  Skin: Skin is warm and dry. No rash noted.  Psychiatric: He has a normal mood and affect. His behavior is normal.  Nursing note and vitals reviewed.  nailbeds are purple and patient is tachypneic. Repeat pulse ox shows 73% saturation   EKG:  Irregular sinus rhythm with ST depression in the lateral leads [facility] reading (PRIMARY) by  Dr. Milus Glazier: Chest x-ray shows hazy density in the right lower lobe. There are chronic interstitial changes and borderline cardiomegaly.  Results for orders placed or performed in visit on 01/06/15  POCT CBC  Result Value Ref Range   WBC 8.8 4.6 - 10.2 K/uL   Lymph, poc 1.0 0.6 - 3.4   POC LYMPH PERCENT 11.5 10 - 50 %L   MID (cbc) 0.6 0 - 0.9   POC MID % 6.6 0 - 12 %M   POC Granulocyte 7.2 (A) 2 - 6.9   Granulocyte percent 81.9 (A) 37 - 80 %G   RBC 5.43 4.69 - 6.13 M/uL   Hemoglobin 13.9 (A) 14.1 - 18.1 g/dL   HCT, POC 20.2 33.4 - 53.7 %   MCV 81.7 80 - 97 fL   MCH, POC 25.5 (A) 27 - 31.2 pg   MCHC 31.2 (A) 31.8 - 35.4 g/dL   RDW, POC 35.6 %   Platelet Count, POC 179 142 - 424 K/uL   MPV 7.1 0 - 99.8  fL       Assessment & Plan:    This chart was scribed in my presence and reviewed by me personally.    ICD-9-CM ICD-10-CM   1. Pulse irregularity 785.9 R09.89 DG Chest 2 View     EKG 12-Lead     DG Chest 2 View     EKG 12-Lead  2. Intractable vomiting with nausea, vomiting of unspecified type 536.2 R11.10   3. Chills 780.64 R68.83 POCT CBC     POCT urinalysis dipstick     POCT UA - Microscopic Only   There are 2 problems here. The patient apparently has a new pneumonia and he has an abnormal cardiogram. Along with the recent vomiting, patient's in no shape to go home. I've called 911 to get patient transferred to the emergency room  stat.    Signed, [name] Sidle, MD

## 2015-01-06 NOTE — ED Notes (Signed)
Pt was [facility] to ED from [facility] on [address] for PNA and new onset afib. EKG with EMS showed Sinus Tach with frequent PACs. Pt has hx of chronic PNA. Pt started feeling bad Thursday having chills and vomited a few times. Pt was here Tuesday for pre-op visit and stated that he had a spray for staph sprayed in his nose and noticed Wednesday he was not feeling good. Pt has hx of RA and stated that he normally breaths hard but wife at bedside stated that he has been breathing harder than normal.

## 2015-01-06 NOTE — H&P (Signed)
[facility] History and Physical  [name] [name] PYP:[id number] DOB: Oct 29, 1934 DOA: 01/06/2015  Referring physician:  Chaney Malling PCP:  Georgann Housekeeper, MD   Chief Complaint:  cough  HPI:  The patient is a 80 y.o. year-old male with history of rheumatoid arthritis on chronic prednisone, CAD, hypertension, hyperlipidemia, hypothyroidism, chronic kidney disease stage 3/4 who presents with cough and chills.  The patient was last at their baseline health until about 5 days ago.  He developed some nonproductive cough, shortness of breath and fatigue. He has had progressive dyspnea on exertion, but he denies lower extremity edema, orthopnea, or PND.  He has had some vomiting over the last 2 days about 2-3 episodes of nonbilious nonbloody emesis. No diarrhea. He states it is not related to coughing. He has had some chills but no obvious fevers. His initial oxygen saturation was 73% on room air.  In the emergency dept, his vital signs were notable for mild tachycardia to the low 100s, mildly elevated blood pressure, oxygen saturation in the low 70s on room air.  His labs were notable for normal WBC, creatinine at baseline. His chest x-ray demonstrated bilateral right greater than left mid and lower lobe opacities concerning for pneumonia. He was incidentally found to have wedge compression fractures. He was given levofloxacin in the emergency department and is being admitted for community-acquired pneumonia.  Review of Systems:  General:   Positive chills,  deniesweight loss or gain HEENT:  Denies changes to hearing and vision, rhinorrhea, sinus congestion, sore throat CV:  Denies chest pain and palpitations, lower extremity edema.  PULM:  Per history of present illness   GI:  Per history of present illness   GU:  Denies dysuria, frequency, urgency ENDO:  Denies polyuria, polydipsia.   HEME:  Denies hematemesis, blood in stools, melena, abnormal bruising or bleeding.  LYMPH:  Denies lymphadenopathy.    MSK:  Chronic arthralgias, myalgias.   DERM:  Denies skin rash or ulcer.   NEURO:  Denies focal numbness, weakness, slurred speech, confusion, facial droop.  PSYCH:  Denies anxiety and depression.    Past Medical History  Diagnosis Date  . Rheumatoid arthritis(714.0)   . Coronary artery disease   . High cholesterol   . Hypertension   . Pneumonia     "has had walking pneumonia twice"  . Exertional dyspnea 03/04/12    "last few months"  . Hypothyroidism   . Chronic kidney disease 03/04/12    "running ~ 40%"  . Umbilical hernia    Past Surgical History  Procedure Laterality Date  . Coronary angioplasty with stent placement  2007; 2010    "2; 1"  . Coronary angioplasty with stent placement  03/04/12    "2; total of 5 now"  . Tonsillectomy  1935  . Appendectomy  1956  . Total knee arthroplasty  2000    bilaterally  . Total hip arthroplasty      bilaterally  . Revision total hip arthroplasty      right; "4 times; it jumped out and had to put it back in twice; redid it @ least once""  . Total shoulder arthroplasty      left  . Foot surgery      "both; arthritis; cut ~ all the bones in my feet were crooked; couldn't walk"  . Cataract extraction w/ intraocular lens  implant, bilateral    . Left heart catheterization with coronary angiogram N/A 03/04/2012    Procedure: LEFT HEART CATHETERIZATION WITH CORONARY ANGIOGRAM;  Surgeon:  Corky Crafts, MD;  Location: [facility] CATH LAB;  Service: Cardiovascular;  Laterality: N/A;  . Joint replacement     Social History:  reports that he quit smoking about 27 years ago. His smoking use included Cigarettes. He has a 76 pack-year smoking history. He has never used smokeless tobacco. He reports that he does not drink alcohol or use illicit drugs.  Allergies  Allergen Reactions  . Aspirin Swelling    Only high doses of aspirin (given for arthritis).; "swelled face, lips, etc"  . Shellfish Allergy Anaphylaxis, Nausea And Vomiting and Swelling   . Lisinopril Cough  . Pantoprazole Other (See Comments)    dizzy    Family History  Problem Relation Age of Onset  . Heart disease Father   . Heart disease Brother   . Heart disease Brother   . Arthritis Mother      Prior to Admission medications   Medication Sig Start Date End Date Taking? Authorizing Provider  acetaminophen (TYLENOL) 650 MG CR tablet Take 650 mg by mouth 2 (two) times daily as needed for pain.    Historical Provider, MD  amLODipine (NORVASC) 2.5 MG tablet Take 1 tablet (2.5 mg total) by mouth daily. Patient taking differently: Take 2.5 mg by mouth daily after supper.  05/04/14   Corky Crafts, MD  aspirin EC 81 MG tablet Take 81 mg by mouth at bedtime.    Historical Provider, MD  clopidogrel (PLAVIX) 75 MG tablet Take 75 mg by mouth daily after supper.     Historical Provider, MD  Etanercept 25 MG/0.5ML SOSY Inject 25 mg into the skin 2 (two) times a week. Wednesday or Thursday and Saturday or Sunday (ENBREL)    Historical Provider, MD  fish oil-omega-3 fatty acids 1000 MG capsule Take 1 g by mouth daily with supper.     Historical Provider, MD  isosorbide mononitrate (IMDUR) 30 MG 24 hr tablet Take 1 tablet (30 mg total) by mouth daily. Patient taking differently: Take 30 mg by mouth daily after supper.  10/31/14   Corky Crafts, MD  levothyroxine (SYNTHROID, LEVOTHROID) 100 MCG tablet Take 100 mcg by mouth daily before breakfast.    Historical Provider, MD  Multiple Vitamin (MULITIVITAMIN WITH MINERALS) TABS Take 1 tablet by mouth daily.    Historical Provider, MD  nitroGLYCERIN (NITROSTAT) 0.4 MG SL tablet Place 1 tablet (0.4 mg total) under the tongue every 5 (five) minutes x 3 doses as needed. For chest pain. Patient taking differently: Place 0.4 mg under the tongue every 5 (five) minutes x 3 doses as needed for chest pain.  01/31/14   Corky Crafts, MD  predniSONE (DELTASONE) 5 MG tablet Take 5 mg by mouth daily with breakfast.     Historical  Provider, MD  ranitidine (ZANTAC) 150 MG tablet Take 150 mg by mouth 2 (two) times daily.    Historical Provider, MD  simvastatin (ZOCOR) 40 MG tablet Take 40 mg by mouth at bedtime.     Historical Provider, MD   Physical Exam: Filed Vitals:   01/06/15 1404 01/06/15 1415 01/06/15 1500  BP: 153/50 147/55   Pulse: 99 52 51  Temp: 98.4 F (36.9 C)    TempSrc: Oral    Resp: 26 25 24   SpO2: 94%  97%     General:  Adult male, thin, no acute distress, nasal cannula in place  Eyes:  PERRL, anicteric, non-injected.  ENT:  Nares clear.  OP clear, non-erythematous without plaques or exudates.  Geographic tongue.MMM.  Neck:  Supple without TM or JVD.    Lymph:  No cervical, supraclavicular, or submandibular LAD.  Cardiovascular:  RRR, normal S1, S2, without m/r/g.  2+ pulses, warm extremities  Respiratory:  Coarse bilateral rales at the bases, no wheezes or rhonchi, without increased WOB.  Abdomen:  NABS.  Soft, ND/NT.    Skin:  No rashes or focal lesions.  Musculoskeletal:  Normal bulk and tone.  No LE edema.  Psychiatric:  A & O x 4.  Appropriate affect.  Neurologic:  CN 3-12 intact.  5/5 strength.  Sensation intact.  Labs on Admission:  Basic Metabolic Panel:  Recent Labs Lab 01/02/15 1103 01/06/15 1436 01/06/15 1447  NA 142 137 138  K 4.9 4.0 4.0  CL 107 104 106  CO2 24 20*  --   GLUCOSE 119* 100* 97  BUN 44* 37* 38*  CREATININE 1.84* 2.04* 1.90*  CALCIUM 9.3 8.7*  --    Liver Function Tests:  Recent Labs Lab 01/06/15 1436  AST 40  ALT 22  ALKPHOS 66  BILITOT 1.2  PROT 7.9  ALBUMIN 3.1*   No results for input(s): LIPASE, AMYLASE in the last 168 hours. No results for input(s): AMMONIA in the last 168 hours. CBC:  Recent Labs Lab 01/02/15 1103 01/06/15 1240 01/06/15 1436 01/06/15 1447  WBC 10.5 8.8 8.3  --   NEUTROABS  --   --  5.3  --   HGB 13.3 13.9* 14.0 16.3  HCT 42.3 44.3 43.4 48.0  MCV 84.6 81.7 83.6  --   PLT 152  --  132*  --     Cardiac Enzymes: No results for input(s): CKTOTAL, CKMB, CKMBINDEX, TROPONINI in the last 168 hours.  BNP (last 3 results) No results for input(s): BNP in the last 8760 hours.  ProBNP (last 3 results)  Recent Labs  01/31/14 0903  PROBNP 178.0*    CBG: No results for input(s): GLUCAP in the last 168 hours.  Radiological Exams on Admission: Dg Chest 2 View  01/06/2015   CLINICAL DATA:  Patient with shortness of breath and dizziness.  EXAM: CHEST  2 VIEW  COMPARISON:  None.  FINDINGS: Exam limited due to motion artifact. Enlarged cardiac and mediastinal contours with tortuosity of the thoracic aorta. Right-greater-than-left mid and lower lung coarse interstitial pulmonary opacities. Biapical pleural parenchymal thickening. No definite pleural effusion or pneumothorax. Mid thoracic spine degenerative changes. Age-indeterminate wedge compression deformities of multiple lower thoracic vertebral bodies. Right glenohumeral joint degenerative changes. Left shoulder arthroplasty.  IMPRESSION: Bilateral mid and lower lung, right-greater-than-left, coarse interstitial opacities may represent combination of chronic changes versus acute superimposed infectious process, particularly in the absence of priors for comparison.  Multiple age-indeterminate wedge compression deformities of lower thoracic vertebral bodies. Recommend correlation for point tenderness.  Recommend Josseline Reddin-term followup radiograph in 3-4 weeks to assess for interval change/resolution given the lack of prior comparisons.  These results will be called to the ordering clinician or representative by the Radiologist Assistant, and communication documented in the PACS or zVision Dashboard.   Electronically Signed   By: Annia Belt M.D.   On: 01/06/2015 13:06    EKG: Independently reviewed. Sinus tachycardia with small Q waves in the inferior leads, stable ST segment depressions and T-wave inversions in V5 and V6.  Assessment/Plan Active  Problems:   CAP (community acquired pneumonia)  ---  Acute hypoxic respiratory failure and sepsis due to CAP -  Urine legionella -  Urine S. pneumo -  Sputum culture if able -  F/U Blood cultures  -  Levofloxacin -  Prednisone 50mg  daily x 5 days -  Wean oxygen as tolerated -  Guaifenesin DM prn -  Flu PCR  Chronic kidney disease stage  3/4, baseline creatinine  2 due to high blood pressure -  Minimize nephrotoxins and renally dose medications  CAD, chest pain-free and no symptoms of congestive heart failure such as lower extremity edema or orthopnea  -  Hold aspirin, Plavix due to upcoming surgery -  Patient is not on a beta blocker, unclear why, however he is on Norvasc -  Due to age he is on a moderate dose statin  -  Continue Imdur and when necessary nitroglycerin glycerin   RA on chronic prednisone, at risk for iatrogenic adrenal insufficiency -  Burst steroids as above for community-acquired pneumonia, then resume 5 mg daily -  Continue Enbrel after completion of treatment for pneumonia  Diet:  Healthy heart Access:  PIV IVF:  Yes Proph:  Heparin  Code Status:  Full Family Communication:  Patient and his wife Disposition Plan: Admit to  telemetry  Time spent: 60 min Renae Fickle [facility] Pager [phone]  If 7PM-7AM, please contact night-coverage [email or web address] Password [facility] 01/06/2015, 5:13 PM

## 2015-01-07 DIAGNOSIS — J189 Pneumonia, unspecified organism: Principal | ICD-10-CM

## 2015-01-07 DIAGNOSIS — R5381 Other malaise: Secondary | ICD-10-CM

## 2015-01-07 DIAGNOSIS — J9601 Acute respiratory failure with hypoxia: Secondary | ICD-10-CM

## 2015-01-07 DIAGNOSIS — I10 Essential (primary) hypertension: Secondary | ICD-10-CM

## 2015-01-07 DIAGNOSIS — E785 Hyperlipidemia, unspecified: Secondary | ICD-10-CM

## 2015-01-07 LAB — CBC
HCT: 39.2 % (ref 39.0–52.0)
HEMOGLOBIN: 12.6 g/dL — AB (ref 13.0–17.0)
MCH: 26.1 pg (ref 26.0–34.0)
MCHC: 31.1 g/dL (ref 30.0–36.0)
MCV: 83.8 fL (ref 78.0–100.0)
Platelets: 109 10*3/uL — ABNORMAL LOW (ref 150–400)
RBC: 4.68 MIL/uL (ref 4.22–5.81)
RDW: 17.6 % — ABNORMAL HIGH (ref 11.5–15.5)
WBC: 4.6 10*3/uL (ref 4.0–10.5)

## 2015-01-07 LAB — BASIC METABOLIC PANEL
ANION GAP: 8 (ref 5–15)
BUN: 27 mg/dL — AB (ref 6–20)
CO2: 19 mmol/L — AB (ref 22–32)
CREATININE: 1.74 mg/dL — AB (ref 0.61–1.24)
Calcium: 7.6 mg/dL — ABNORMAL LOW (ref 8.9–10.3)
Chloride: 109 mmol/L (ref 101–111)
GFR calc Af Amer: 42 mL/min — ABNORMAL LOW (ref >60)
GFR calc non Af Amer: 36 mL/min — ABNORMAL LOW (ref >60)
Glucose, Bld: 77 mg/dL (ref 65–99)
POTASSIUM: 3.8 mmol/L (ref 3.5–5.1)
SODIUM: 136 mmol/L (ref 135–145)

## 2015-01-07 LAB — INFLUENZA PANEL BY PCR (TYPE A & B)
H1N1FLUPCR: NOT DETECTED
Influenza A By PCR: NEGATIVE
Influenza B By PCR: NEGATIVE

## 2015-01-07 LAB — HIV ANTIBODY (ROUTINE TESTING W REFLEX): HIV Screen 4th Generation wRfx: NONREACTIVE

## 2015-01-07 MED ORDER — IPRATROPIUM-ALBUTEROL 0.5-2.5 (3) MG/3ML IN SOLN: 3.0000 mL | Freq: Four times a day (QID) | RESPIRATORY_TRACT | Status: DC | PRN

## 2015-01-07 MED ORDER — ASPIRIN EC 81 MG PO TBEC
81.0000 mg | DELAYED_RELEASE_TABLET | Freq: Every day | ORAL | Status: DC
  Administered 2015-01-07 – 2015-01-08 (×2): 81 mg via ORAL
  Filled 2015-01-07 (×2): qty 1

## 2015-01-07 MED ORDER — CLOPIDOGREL BISULFATE 75 MG PO TABS
75.0000 mg | ORAL_TABLET | Freq: Every day | ORAL | Status: DC
  Administered 2015-01-07 – 2015-01-08 (×2): 75 mg via ORAL
  Filled 2015-01-07 (×2): qty 1

## 2015-01-07 MED ORDER — ATORVASTATIN CALCIUM 20 MG PO TABS
20.0000 mg | ORAL_TABLET | Freq: Every day | ORAL | Status: DC
  Administered 2015-01-07: 20 mg via ORAL
  Filled 2015-01-07 (×2): qty 1

## 2015-01-07 NOTE — Evaluation (Signed)
Physical Therapy Evaluation Patient Details Name: [name] [name] MRN: [id number] DOB: 1935/02/22 Today's Date: 01/07/2015   History of Present Illness  80 yo male with mult joint replacements including L shoulder, B hips and now R rot cuff tear, was admitted for PNA with susp flu the week before scheduled R shoulder repair on 5/27.  Clinical Impression  Pt was seen for assessment of mobllity and is planning a R shoulder repair next week.  He will be in surgery soon and will recommend SNF then but for now can manage with wife at home with HHPT.  Will need to focus on mobility of balance and ROM to hips and ankles as able pre-surgery    Follow Up Recommendations Home health PT;Supervision/Assistance - 24 hour    Equipment Recommendations  None recommended by PT    Recommendations for Other Services       Precautions / Restrictions Precautions Precautions: Fall Precaution Comments: droplet Restrictions Weight Bearing Restrictions: No      Mobility  Bed Mobility Overal bed mobility: Needs Assistance Bed Mobility: Supine to Sit;Sit to Supine     Supine to sit: Min guard;Min assist Sit to supine: Min guard;Min assist   General bed mobility comments: difficulty with MP joint changes of hands and R shoulder with rot cuff tear  Transfers Overall transfer level: Needs assistance Equipment used: Rolling walker (2 wheeled);1 person hand held assist Transfers: Sit to/from [employer] Sit to Stand: Min guard;Min assist Stand pivot transfers: Min guard;Min assist       General transfer comment: unsteady on his feet  Ambulation/Gait Ambulation/Gait assistance: Min guard;Min assist Ambulation Distance (Feet): 50 Feet Assistive device: Rolling walker (2 wheeled);1 person hand held assist Gait Pattern/deviations: Shuffle;Decreased dorsiflexion - left;Decreased dorsiflexion - right;Decreased stride length;Wide base of support;Drifts right/left Gait velocity:  reduced Gait velocity interpretation: Below normal speed for age/gender General Gait Details: tends to want to let go of walker so did both with and without;  Very unsteady with no AD and used furniture to support, needs assistance toncontrol balance either way but backward lean with no walker  Stairs            Wheelchair Mobility    Modified Rankin (Stroke Patients Only)       Balance Overall balance assessment: Needs assistance Sitting-balance support: Feet supported Sitting balance-Leahy Scale: Fair   Postural control: Posterior lean Standing balance support: Bilateral upper extremity supported Standing balance-Leahy Scale: Poor Standing balance comment: walker is counter balnce for backward lean                             Pertinent Vitals/Pain Pain Assessment: Faces Pain Score: 0-No pain Faces Pain Scale: Hurts little more Pain Location: mult joints Pain Intervention(s): Limited activity within patient's tolerance;Monitored during session;Premedicated before session;Repositioned    Home Living Family/patient expects to be discharged to:: Private residence Living Arrangements: Spouse/significant other Available Help at Discharge: Family Type of Home: House Home Access: Stairs to enter Entrance Stairs-Rails: Can reach both;Left;Right Entrance Stairs-Number of Steps: 3 Home Layout: One level Home Equipment: Environmental consultant - 2 wheels;Cane - single point;Toilet riser;Shower seat      Prior Function Level of Independence: Needs assistance   Gait / Transfers Assistance Needed: AD and supervised at times  ADL's / Homemaking Assistance Needed: Wife assists with this        Hand Dominance        Extremity/Trunk Assessment   Upper Extremity  Assessment: Generalized weakness           Lower Extremity Assessment: Generalized weakness      Cervical / Trunk Assessment: Kyphotic  Communication   Communication: HOH;Other (comment) (needs to see  mouth of speaker to fully understand)  Cognition Arousal/Alertness: Awake/alert Behavior During Therapy: WFL for tasks assessed/performed Overall Cognitive Status: Within Functional Limits for tasks assessed                      General Comments General comments (skin integrity, edema, etc.): Pt is having some diffciulty with controlling balance and tends to want to leave walker.  He is not safe to transfer or walk alone and will need wife to assist him, as she is aware.    Exercises        Assessment/Plan    PT Assessment Patient needs continued PT services  PT Diagnosis Difficulty walking;Generalized weakness   PT Problem List Decreased strength;Decreased range of motion;Decreased activity tolerance;Decreased balance;Decreased mobility;Decreased coordination;Decreased knowledge of use of DME;Decreased knowledge of precautions;Decreased safety awareness;Cardiopulmonary status limiting activity;Pain  PT Treatment Interventions DME instruction;Gait training;Stair training;Functional mobility training;Therapeutic activities;Therapeutic exercise;Balance training;Neuromuscular re-education;Patient/family education   PT Goals (Current goals can be found in the Care Plan section) Acute Rehab PT Goals Patient Stated Goal: to go home today PT Goal Formulation: With patient/family Time For Goal Achievement: 01/21/15 Potential to Achieve Goals: Good    Frequency Min 2X/week   Barriers to discharge Other (comment) (Continual assistance needed and wife aware)      Co-evaluation               End of Session Equipment Utilized During Treatment: Gait belt Activity Tolerance: Patient tolerated treatment well;Patient limited by fatigue Patient left: in bed;with call bell/phone within reach;with family/visitor present Nurse Communication: Mobility status         Time: 9470-9628 PT Time Calculation (min) (ACUTE ONLY): 32 min   Charges:   PT Evaluation $Initial PT  Evaluation Tier I: 1 Procedure PT Treatments $Gait Training: 8-22 mins   PT G CodesIvar Drape 02-03-15, 12:29 PM  Samul Dada, PT MS Acute Rehab Dept. Number: [facility] [id number] and [facility] [phone]

## 2015-01-07 NOTE — Progress Notes (Signed)
Utilization review completed.  

## 2015-01-07 NOTE — Progress Notes (Addendum)
[facility] PROGRESS NOTE  [name] [name] [id number]:[id number] DOB: 05-22-1935 DOA: 01/06/2015 PCP: Georgann Housekeeper, MD  Assessment/Plan:  Acute hypoxic respiratory failure due to CAP -feeling better and breathing easier - F/U Blood cultures, urine legionella and strep pneumo  - continue Levofloxacin - Prednisone 50mg  daily x 5 days; then resume home dose of 5mg  daily - Wean oxygen as tolerated - Guaifenesin DM prn - Flu PCR pending -will start flutter valve  Chronic kidney disease stage 3-4, baseline creatininearound 2; due to high blood pressure - Minimize nephrotoxins and renally dose medications  CAD, chest pain-free and no symptoms of congestive heart failure such as lower extremity edema, crackles, JVD or orthopnea  -patient was Holding aspirin and Plavix due to upcoming surgery; but surgery will most likely not be done until 2 weeks or so. Will resume his ASA and plavix. - Patient is not on a beta blocker, unclear why, however he is on Norvasc - continue statin - Continue Imdur and PRNnitroglycerin glycerin   HTN -continue norvasc  HLD -continue statins and fish oil  RA on chronic prednisone, at risk for iatrogenic adrenal insufficiency - Burst steroids as above for community-acquired pneumonia, then resume 5 mg daily - Continue Enbrel after completion of treatment for pneumonia  Code Status: Full Family Communication: wife at bedside Disposition Plan: home with home health services when medically stable   Consultants:  None   Procedures: See below for x-ray reports  Antibiotics:  levaquin 5/21  HPI/Subjective: Afebrile, no CP and with improvement in his SOB. Denies nausea and vomiting. No tachypnea and with O2 sat of 93% on 2L   Objective: Filed Vitals:   01/07/15 0327  BP: 101/43  Pulse: 88  Temp: 98.1 F (36.7 C)  Resp: 18    Intake/Output Summary (Last 24 hours) at 01/07/15 1555 Last data filed at 01/07/15 1044  Gross per 24  hour  Intake 1426.67 ml  Output    700 ml  Net 726.67 ml   Filed Weights   01/06/15 1757  Weight: 61.9 kg (136 lb 7.4 oz)    Exam:   General:  Afebrile, feeling somewhat better; no CP and no further nausea or vomiting. Still with some SOB  Cardiovascular: S1 and S2, no rubs or gallops  Respiratory: scattered rhonchi, no crackles and no wheezing   Abdomen: soft, NT, ND, positive BS  Musculoskeletal: no edema or cyanosis; complaining of shoulder pain (with movement and no new)  Data Reviewed: Basic Metabolic Panel:  Recent Labs Lab 01/02/15 1103 01/06/15 1436 01/06/15 1447 01/07/15 0610  NA 142 137 138 136  K 4.9 4.0 4.0 3.8  CL 107 104 106 109  CO2 24 20*  --  19*  GLUCOSE 119* 100* 97 77  BUN 44* 37* 38* 27*  CREATININE 1.84* 2.04* 1.90* 1.74*  CALCIUM 9.3 8.7*  --  7.6*   Liver Function Tests:  Recent Labs Lab 01/06/15 1436  AST 40  ALT 22  ALKPHOS 66  BILITOT 1.2  PROT 7.9  ALBUMIN 3.1*   CBC:  Recent Labs Lab 01/02/15 1103 01/06/15 1240 01/06/15 1436 01/06/15 1447 01/07/15 0610  WBC 10.5 8.8 8.3  --  4.6  NEUTROABS  --   --  5.3  --   --   HGB 13.3 13.9* 14.0 16.3 12.6*  HCT 42.3 44.3 43.4 48.0 39.2  MCV 84.6 81.7 83.6  --  83.8  PLT 152  --  132*  --  109*   ProBNP (last 3 results)  Recent Labs  01/31/14 0903  PROBNP 178.0*    CBG: No results for input(s): GLUCAP in the last 168 hours.  Recent Results (from the past 240 hour(s))  Surgical pcr screen     Status: None   Collection Time: 01/02/15 11:03 AM  Result Value Ref Range Status   MRSA, PCR NEGATIVE NEGATIVE Final   Staphylococcus aureus NEGATIVE NEGATIVE Final    Comment:        The Xpert SA Assay (FDA approved for NASAL specimens in patients over 76 years of age), is one component of a comprehensive surveillance program.  Test performance has been validated by [facility] for patients greater than or equal to 14 year old. It is not intended to diagnose infection  nor to guide or monitor treatment.      Studies: Dg Chest 2 View  01/06/2015   CLINICAL DATA:  Patient with shortness of breath and dizziness.  EXAM: CHEST  2 VIEW  COMPARISON:  None.  FINDINGS: Exam limited due to motion artifact. Enlarged cardiac and mediastinal contours with tortuosity of the thoracic aorta. Right-greater-than-left mid and lower lung coarse interstitial pulmonary opacities. Biapical pleural parenchymal thickening. No definite pleural effusion or pneumothorax. Mid thoracic spine degenerative changes. Age-indeterminate wedge compression deformities of multiple lower thoracic vertebral bodies. Right glenohumeral joint degenerative changes. Left shoulder arthroplasty.  IMPRESSION: Bilateral mid and lower lung, right-greater-than-left, coarse interstitial opacities may represent combination of chronic changes versus acute superimposed infectious process, particularly in the absence of priors for comparison.  Multiple age-indeterminate wedge compression deformities of lower thoracic vertebral bodies. Recommend correlation for point tenderness.  Recommend short-term followup radiograph in 3-4 weeks to assess for interval change/resolution given the lack of prior comparisons.  These results will be called to the ordering clinician or representative by the Radiologist Assistant, and communication documented in the PACS or zVision Dashboard.   Electronically Signed   By: Annia Belt M.D.   On: 01/06/2015 13:06    Scheduled Meds: . amLODipine  2.5 mg Oral QPC supper  . atorvastatin  20 mg Oral QHS  . famotidine  20 mg Oral Daily  . heparin  5,000 Units Subcutaneous 3 times per day  . isosorbide mononitrate  30 mg Oral Daily  . [START ON 01/08/2015] levofloxacin  750 mg Oral Q48H  . levothyroxine  100 mcg Oral QAC breakfast  . multivitamin with minerals  1 tablet Oral Daily  . predniSONE  50 mg Oral Q breakfast    Time spent: 35 minutes    Vassie Loll  [facility] Pager  [phone]. If 7PM-7AM, please contact night-coverage at [email or web address], password [facility] 01/07/2015, 3:55 PM  LOS: 1 day

## 2015-01-08 DIAGNOSIS — M069 Rheumatoid arthritis, unspecified: Secondary | ICD-10-CM

## 2015-01-08 DIAGNOSIS — E785 Hyperlipidemia, unspecified: Secondary | ICD-10-CM | POA: Insufficient documentation

## 2015-01-08 DIAGNOSIS — R0602 Shortness of breath: Secondary | ICD-10-CM | POA: Insufficient documentation

## 2015-01-08 LAB — LEGIONELLA ANTIGEN, URINE

## 2015-01-08 LAB — CBC
HEMATOCRIT: 36 % — AB (ref 39.0–52.0)
HEMOGLOBIN: 11.5 g/dL — AB (ref 13.0–17.0)
MCH: 26.2 pg (ref 26.0–34.0)
MCHC: 31.9 g/dL (ref 30.0–36.0)
MCV: 82 fL (ref 78.0–100.0)
PLATELETS: 126 10*3/uL — AB (ref 150–400)
RBC: 4.39 MIL/uL (ref 4.22–5.81)
RDW: 17.1 % — AB (ref 11.5–15.5)
WBC: 7.2 10*3/uL (ref 4.0–10.5)

## 2015-01-08 LAB — BASIC METABOLIC PANEL
Anion gap: 10 (ref 5–15)
BUN: 27 mg/dL — AB (ref 6–20)
CO2: 18 mmol/L — ABNORMAL LOW (ref 22–32)
Calcium: 7.9 mg/dL — ABNORMAL LOW (ref 8.9–10.3)
Chloride: 108 mmol/L (ref 101–111)
Creatinine, Ser: 1.4 mg/dL — ABNORMAL HIGH (ref 0.61–1.24)
GFR, EST AFRICAN AMERICAN: 54 mL/min — AB (ref >60)
GFR, EST NON AFRICAN AMERICAN: 47 mL/min — AB (ref >60)
GLUCOSE: 119 mg/dL — AB (ref 65–99)
Potassium: 4.2 mmol/L (ref 3.5–5.1)
Sodium: 136 mmol/L (ref 135–145)

## 2015-01-08 MED ORDER — IPRATROPIUM-ALBUTEROL 20-100 MCG/ACT IN AERS: 1.0000 | INHALATION_SPRAY | Freq: Four times a day (QID) | RESPIRATORY_TRACT | Status: AC | PRN

## 2015-01-08 MED ORDER — PREDNISONE 20 MG PO TABS: ORAL_TABLET | ORAL | Status: DC

## 2015-01-08 MED ORDER — GUAIFENESIN ER 600 MG PO TB12: 600.0000 mg | ORAL_TABLET | Freq: Two times a day (BID) | ORAL | Status: AC

## 2015-01-08 MED ORDER — PREDNISONE 5 MG PO TABS: 5.0000 mg | ORAL_TABLET | Freq: Every day | ORAL | Status: AC

## 2015-01-08 MED ORDER — LEVOFLOXACIN 750 MG PO TABS: 750.0000 mg | ORAL_TABLET | ORAL | Status: DC

## 2015-01-08 NOTE — Discharge Summary (Signed)
Physician Discharge Summary  [name] [name] GYK:[id number] DOB: [birth year] DOA: 01/06/2015  PCP: Georgann Housekeeper, MD  Admit date: 01/06/2015 Discharge date: 01/08/2015  Time spent: >30 minutes  Recommendations for Outpatient Follow-up:  1. Repeat BMET to follow electrolytes and renal function 2. Repeat CXR in 2 weeks to follow resolution/stability of infiltrates   Discharge Diagnoses:  CAP (community acquired pneumonia) Acute respiratory failure with hypoxia HTN HLD Hx of CAD RA GERD Rotator Cuff syndrome  Discharge Condition: stable and improved. No CP, no SOB, no N/V. Patient will follow up with PCP in 10 days  Diet recommendation: heart healthy diet  Filed Weights   01/06/15 1757 01/08/15 0432  Weight: 61.9 kg (136 lb 7.4 oz) 58.6 kg (129 lb 3 oz)    History of present illness:  80 y.o. year-old male with history of rheumatoid arthritis on chronic prednisone, CAD, hypertension, hyperlipidemia, hypothyroidism, chronic kidney disease stage 3/4 who presents with cough and chills. The patient was last at their baseline health until about 5 days ago. He developed some nonproductive cough, shortness of breath and fatigue. He has had progressive dyspnea on exertion, but he denies lower extremity edema, orthopnea, or PND. He has had some vomiting over the last 2 days about 2-3 episodes of nonbilious nonbloody emesis. No diarrhea. He states it is not related to coughing. He has had some chills but no obvious fevers. His initial oxygen saturation was 73% on room air.  Hospital Course:  Acute hypoxic respiratory failure due to CAP -feeling a lot better and breathing easier; wants to go home -F/U final Blood cultures, urine legionella and strep pneumo (up to time of discharge were negative)  -continue Levofloxacin -Prednisone to be tapered in the next days down to 5mg  daily again -no oxygen needed on assessment -mucinex BID for 10 days -Flu PCR neg -will encourage to use flutter  valve as instructed  Chronic kidney disease stage 3-4, baseline creatininearound 2; due to high blood pressure - Minimize nephrotoxins and renally dose medications -at discharge his Cr was 1.40  CAD, chest pain-free and no symptoms of congestive heart failure such as lower extremity edema, crackles, JVD or orthopnea  -patient was Holding aspirin and Plavix due to upcoming surgery; but surgery will most likely not be done until 2 weeks or so. Will resume his ASA and plavix. - Patient is not on a beta blocker, unclear why, however he is on Norvasc - continue statin - Continue Imdur and PR Nnitroglycerin   HTN -continue norvasc -BP stable and well controlled  HLD -continue statins and fish oil  RA on chronic prednisone, at risk for iatrogenic adrenal insufficiency - Burst steroids as above for community-acquired pneumonia, then resume 5 mg daily - Continue Enbrel after completion of treatment for pneumonia  GERD: -continue PPI  Rotator Cuff syndrome -plan is for surgical repair by Dr. -given acute PNA; will hold surgery for at least 10 days  Procedures:  See below for x-ray reports   Consultations:  None   Discharge Exam: Filed Vitals:   01/08/15 0432  BP: 125/57  Pulse: 64  Temp: 98 F (36.7 C)  Resp: 18    General: Afebrile, feeling much better; no CP and no further nausea or vomiting. Reports no SOB and just intermittent cough. Patient with good O2 sat on RA (at rest and on ambulation)  Cardiovascular: S1 and S2, no rubs or gallops  Respiratory: scattered rhonchi, no crackles and no wheezing. Improved air movement    Abdomen: soft, NT,  ND, positive BS  Musculoskeletal: no edema or cyanosis; complaining of shoulder pain (with movement and no new)  Discharge Instructions   Discharge Instructions    Diet - low sodium heart healthy    Complete by:  As directed      Discharge instructions    Complete by:  As directed   Take medications as  prescribed Arrange follow up with PCP in 10 days Follow heart healthy diet and maintain adequate hydration          Current Discharge Medication List    START taking these medications   Details  guaiFENesin (MUCINEX) 600 MG 12 hr tablet Take 1 tablet (600 mg total) by mouth 2 (two) times daily. Qty: 20 tablet, Refills: 0    Ipratropium-Albuterol (COMBIVENT RESPIMAT) 20-100 MCG/ACT AERS respimat Inhale 1 puff into the lungs every 6 (six) hours as needed for wheezing or shortness of breath. Qty: 1 Inhaler, Refills: 0    levofloxacin (LEVAQUIN) 750 MG tablet Take 1 tablet (750 mg total) by mouth every other day. Qty: 4 tablet, Refills: 0      CONTINUE these medications which have CHANGED   Details  !! predniSONE (DELTASONE) 20 MG tablet Take 2 tablet by mouth daily X 2 days; then 1 tablet by mouth daily X 3 days; then 1/2 tablet by mouth daily X 3 days and then resume your prednisone 5mg  daily as yo were taking before. Qty: 10 tablet, Refills: 0    !! predniSONE (DELTASONE) 5 MG tablet Take 1 tablet (5 mg total) by mouth daily with breakfast. To be started after tapering dose achieved.     !! - Potential duplicate medications found. Please discuss with provider.    CONTINUE these medications which have NOT CHANGED   Details  acetaminophen (TYLENOL) 650 MG CR tablet Take 650 mg by mouth 2 (two) times daily as needed for pain.    amLODipine (NORVASC) 2.5 MG tablet Take 1 tablet (2.5 mg total) by mouth daily. Qty: 90 tablet, Refills: 3    aspirin EC 81 MG tablet Take 81 mg by mouth at bedtime.    clopidogrel (PLAVIX) 75 MG tablet Take 75 mg by mouth daily after supper.     Etanercept 25 MG/0.5ML SOSY Inject 25 mg into the skin 2 (two) times a week. Wednesday or Thursday and Saturday or Sunday (ENBREL)    fish oil-omega-3 fatty acids 1000 MG capsule Take 1 g by mouth daily with supper.     isosorbide mononitrate (IMDUR) 30 MG 24 hr tablet Take 1 tablet (30 mg total) by mouth  daily. Qty: 90 tablet, Refills: 3    levothyroxine (SYNTHROID, LEVOTHROID) 100 MCG tablet Take 100 mcg by mouth daily before breakfast.    Multiple Vitamin (MULITIVITAMIN WITH MINERALS) TABS Take 1 tablet by mouth daily.    nitroGLYCERIN (NITROSTAT) 0.4 MG SL tablet Place 1 tablet (0.4 mg total) under the tongue every 5 (five) minutes x 3 doses as needed. For chest pain. Qty: 25 tablet, Refills: 5    ranitidine (ZANTAC) 150 MG tablet Take 150 mg by mouth daily.     simvastatin (ZOCOR) 40 MG tablet Take 40 mg by mouth at bedtime.        Allergies  Allergen Reactions  . Aspirin Swelling    Only high doses of aspirin (given for arthritis).; "swelled face, lips, etc"  . Shellfish Allergy Anaphylaxis, Nausea And Vomiting and Swelling  . Lisinopril Cough  . Pantoprazole Other (See Comments)    dizzy  Follow-up Information    Follow up with HUSAIN,KARRAR, MD. Schedule an appointment as soon as possible for a visit in 10 days.   Specialty:  Internal Medicine   Contact information:   [address] [place] [address] [phone]       The results of significant diagnostics from this hospitalization (including imaging, microbiology, ancillary and laboratory) are listed below for reference.    Significant Diagnostic Studies: Dg Chest 2 View  01/06/2015   CLINICAL DATA:  Patient with shortness of breath and dizziness.  EXAM: CHEST  2 VIEW  COMPARISON:  None.  FINDINGS: Exam limited due to motion artifact. Enlarged cardiac and mediastinal contours with tortuosity of the thoracic aorta. Right-greater-than-left mid and lower lung coarse interstitial pulmonary opacities. Biapical pleural parenchymal thickening. No definite pleural effusion or pneumothorax. Mid thoracic spine degenerative changes. Age-indeterminate wedge compression deformities of multiple lower thoracic vertebral bodies. Right glenohumeral joint degenerative changes. Left shoulder arthroplasty.  IMPRESSION:  Bilateral mid and lower lung, right-greater-than-left, coarse interstitial opacities may represent combination of chronic changes versus acute superimposed infectious process, particularly in the absence of priors for comparison.  Multiple age-indeterminate wedge compression deformities of lower thoracic vertebral bodies. Recommend correlation for point tenderness.  Recommend short-term followup radiograph in 3-4 weeks to assess for interval change/resolution given the lack of prior comparisons.  These results will be called to the ordering clinician or representative by the Radiologist Assistant, and communication documented in the PACS or zVision Dashboard.   Electronically Signed   By: Annia Belt M.D.   On: 01/06/2015 13:06    Microbiology: Recent Results (from the past 240 hour(s))  Surgical pcr screen     Status: None   Collection Time: 01/02/15 11:03 AM  Result Value Ref Range Status   MRSA, PCR NEGATIVE NEGATIVE Final   Staphylococcus aureus NEGATIVE NEGATIVE Final    Comment:        The Xpert SA Assay (FDA approved for NASAL specimens in patients over 71 years of age), is one component of a comprehensive surveillance program.  Test performance has been validated by [facility] for patients greater than or equal to 50 year old. It is not intended to diagnose infection nor to guide or monitor treatment.   Blood culture (routine x 2)     Status: None (Preliminary result)   Collection Time: 01/06/15  2:40 PM  Result Value Ref Range Status   Specimen Description BLOOD RIGHT ARM  Final   Special Requests BOTTLES DRAWN AEROBIC AND ANAEROBIC 5CC  Final   Culture   Final           BLOOD CULTURE RECEIVED NO GROWTH TO DATE CULTURE WILL BE HELD FOR 5 DAYS BEFORE ISSUING A FINAL NEGATIVE REPORT Performed at [facility]    Report Status PENDING  Incomplete  Blood culture (routine x 2)     Status: None (Preliminary result)   Collection Time: 01/06/15  2:40 PM  Result Value Ref  Range Status   Specimen Description BLOOD RIGHT HAND  Final   Special Requests BOTTLES DRAWN AEROBIC AND ANAEROBIC 4CC  Final   Culture   Final           BLOOD CULTURE RECEIVED NO GROWTH TO DATE CULTURE WILL BE HELD FOR 5 DAYS BEFORE ISSUING A FINAL NEGATIVE REPORT Performed at [facility]    Report Status PENDING  Incomplete     Labs: Basic Metabolic Panel:  Recent Labs Lab 01/02/15 1103 01/06/15  1436 01/06/15 1447 01/07/15 0610 01/08/15 0435  NA 142 137 138 136 136  K 4.9 4.0 4.0 3.8 4.2  CL 107 104 106 109 108  CO2 24 20*  --  19* 18*  GLUCOSE 119* 100* 97 77 119*  BUN 44* 37* 38* 27* 27*  CREATININE 1.84* 2.04* 1.90* 1.74* 1.40*  CALCIUM 9.3 8.7*  --  7.6* 7.9*   Liver Function Tests:  Recent Labs Lab 01/06/15 1436  AST 40  ALT 22  ALKPHOS 66  BILITOT 1.2  PROT 7.9  ALBUMIN 3.1*   CBC:  Recent Labs Lab 01/02/15 1103 01/06/15 1240 01/06/15 1436 01/06/15 1447 01/07/15 0610 01/08/15 0435  WBC 10.5 8.8 8.3  --  4.6 7.2  NEUTROABS  --   --  5.3  --   --   --   HGB 13.3 13.9* 14.0 16.3 12.6* 11.5*  HCT 42.3 44.3 43.4 48.0 39.2 36.0*  MCV 84.6 81.7 83.6  --  83.8 82.0  PLT 152  --  132*  --  109* 126*   ProBNP (last 3 results)  Recent Labs  01/31/14 0903  PROBNP 178.0*    Signed:  Vassie Loll  [facility] 01/08/2015, 12:47 PM

## 2015-01-08 NOTE — Progress Notes (Signed)
Discharge instructions given. Pt verbalized understanding and all questions were answered.  

## 2015-01-09 NOTE — Progress Notes (Signed)
Had to leave VM for Danielle regarding upcoming surgery on 5/26.  DA

## 2015-01-13 LAB — CULTURE, BLOOD (ROUTINE X 2)
CULTURE: NO GROWTH
Culture: NO GROWTH

## 2015-01-16 ENCOUNTER — Ambulatory Visit
Admission: RE | Admit: 2015-01-16 | Discharge: 2015-01-16 | Disposition: A | Discharge status: Home / Self Care | Payer: Medicare Other | Source: Ambulatory Visit | Attending: Internal Medicine | Admitting: Internal Medicine

## 2015-01-16 ENCOUNTER — Other Ambulatory Visit: Payer: Self-pay | Admitting: Internal Medicine

## 2015-01-16 DIAGNOSIS — J189 Pneumonia, unspecified organism: Secondary | ICD-10-CM

## 2015-01-18 NOTE — H&P (Signed)
[name] [name] is an 80 y.o. male.    Chief Complaint: right shoulder pain/weakness   HPI: Pt is a 80 y.o. male complaining of right shoulder pain for multiple years. Pain had continually increased since the beginning. X-rays in the clinic show end-stage arthritic changes of the right shoulder. Pt has tried various conservative treatments which have failed to alleviate their symptoms, including injections and therapy. Various options are discussed with the patient. Risks, benefits and expectations were discussed with the patient. Patient understand the risks, benefits and expectations and wishes to proceed with surgery.   PCP:  Georgann Housekeeper, MD  D/C Plans: Home  PMH: Past Medical History  Diagnosis Date  . Rheumatoid arthritis(714.0)   . Coronary artery disease   . High cholesterol   . Hypertension   . Pneumonia     "has had walking pneumonia twice"  . Exertional dyspnea 03/04/12    "last few months"  . Hypothyroidism   . Chronic kidney disease 03/04/12    "running ~ 40%"  . Umbilical hernia     PSH: Past Surgical History  Procedure Laterality Date  . Coronary angioplasty with stent placement  2007; 2010    "2; 1"  . Coronary angioplasty with stent placement  03/04/12    "2; total of 5 now"  . Tonsillectomy  1935  . Appendectomy  1956  . Total knee arthroplasty  2000    bilaterally  . Total hip arthroplasty      bilaterally  . Revision total hip arthroplasty      right; "4 times; it jumped out and had to put it back in twice; redid it @ least once""  . Total shoulder arthroplasty      left  . Foot surgery      "both; arthritis; cut ~ all the bones in my feet were crooked; couldn't walk"  . Cataract extraction w/ intraocular lens  implant, bilateral    . Left heart catheterization with coronary angiogram N/A 03/04/2012    Procedure: LEFT HEART CATHETERIZATION WITH CORONARY ANGIOGRAM;  Surgeon: Corky Crafts, MD;  Location: [facility] CATH LAB;  Service: Cardiovascular;   Laterality: N/A;  . Joint replacement      Social History:  reports that he quit smoking about 27 years ago. His smoking use included Cigarettes. He has a 76 pack-year smoking history. He has never used smokeless tobacco. He reports that he does not drink alcohol or use illicit drugs.  Allergies:  Allergies  Allergen Reactions  . Aspirin Swelling    Only high doses of aspirin (given for arthritis).; "swelled face, lips, etc"  . Shellfish Allergy Anaphylaxis, Nausea And Vomiting and Swelling  . Lisinopril Cough  . Pantoprazole Other (See Comments)    dizzy    Medications: No current facility-administered medications for this encounter.   Current Outpatient Prescriptions  Medication Sig Dispense Refill  . acetaminophen (TYLENOL) 650 MG CR tablet Take 650 mg by mouth 2 (two) times daily as needed for pain.    Marland Kitchen amLODipine (NORVASC) 2.5 MG tablet Take 1 tablet (2.5 mg total) by mouth daily. (Patient taking differently: Take 2.5 mg by mouth daily after supper. ) 90 tablet 3  . aspirin EC 81 MG tablet Take 81 mg by mouth at bedtime.    . clopidogrel (PLAVIX) 75 MG tablet Take 75 mg by mouth daily after supper.     . Etanercept 25 MG/0.5ML SOSY Inject 25 mg into the skin 2 (two) times a week. Wednesday or Thursday and Saturday  or Sunday (ENBREL)    . fish oil-omega-3 fatty acids 1000 MG capsule Take 1 g by mouth daily with supper.     . isosorbide mononitrate (IMDUR) 30 MG 24 hr tablet Take 1 tablet (30 mg total) by mouth daily. (Patient taking differently: Take 30 mg by mouth daily after supper. ) 90 tablet 3  . levothyroxine (SYNTHROID, LEVOTHROID) 100 MCG tablet Take 100 mcg by mouth daily before breakfast.    . Multiple Vitamin (MULITIVITAMIN WITH MINERALS) TABS Take 1 tablet by mouth daily.    . nitroGLYCERIN (NITROSTAT) 0.4 MG SL tablet Place 1 tablet (0.4 mg total) under the tongue every 5 (five) minutes x 3 doses as needed. For chest pain. (Patient taking differently: Place 0.4 mg  under the tongue every 5 (five) minutes x 3 doses as needed for chest pain. ) 25 tablet 5  . simvastatin (ZOCOR) 40 MG tablet Take 40 mg by mouth at bedtime.     Marland Kitchen guaiFENesin (MUCINEX) 600 MG 12 hr tablet Take 1 tablet (600 mg total) by mouth 2 (two) times daily. 20 tablet 0  . Ipratropium-Albuterol (COMBIVENT RESPIMAT) 20-100 MCG/ACT AERS respimat Inhale 1 puff into the lungs every 6 (six) hours as needed for wheezing or shortness of breath. 1 Inhaler 0  . levofloxacin (LEVAQUIN) 750 MG tablet Take 1 tablet (750 mg total) by mouth every other day. 4 tablet 0  . predniSONE (DELTASONE) 20 MG tablet Take 2 tablet by mouth daily X 2 days; then 1 tablet by mouth daily X 3 days; then 1/2 tablet by mouth daily X 3 days and then resume your prednisone 5mg  daily as yo were taking before. 10 tablet 0  . predniSONE (DELTASONE) 5 MG tablet Take 1 tablet (5 mg total) by mouth daily with breakfast. To be started after tapering dose achieved.    . ranitidine (ZANTAC) 150 MG tablet Take 150 mg by mouth daily.       No results found for this or any previous visit (from the past 48 hour(s)). Dg Chest 2 View  01/16/2015   CLINICAL DATA:  History of community acquired pneumonia, no chest complaints currently ; history of coronary artery disease treated with stent placement  EXAM: CHEST  2 VIEW  COMPARISON:  PA and lateral chest of November 22, 2011  FINDINGS: The lungs are adequately inflated. The interstitial markings are less prominent today than on the earlier study. There is stable apical pleural thickening. There is no pleural effusion. The cardiac silhouette is top-normal in size. The pulmonary vascularity is normal. There is a prosthetic left shoulder joint. There is moderate to severe degenerative change of the right shoulder. There is stable dextrocurvature of the lumbar spine.  IMPRESSION: COPD. There is no evidence of pneumonia nor pulmonary edema nor other acute cardiopulmonary disease.   Electronically Signed    By: David  November 24, 2011 M.D.   On: 01/16/2015 10:27    ROS: Pain with rom of the right upper extremity  Physical Exam:  Alert and oriented 80 y.o. male in no acute distress Cranial nerves 2-12 intact Cervical spine: full rom with no tenderness, nv intact distally Chest: active breath sounds bilaterally, no wheeze rhonchi or rales Heart: regular rate and rhythm, no murmur Abd: non tender non distended with active bowel sounds Hip is stable with rom  Right shoulder with limited rom and strength due to cuff insufficiency nv intact distally No rashes or edema   Assessment/Plan Assessment: right shoulder pain/weakness secondary to rotator cuff insufficiency  Plan: Patient will undergo a right reverse total shoulder by Dr. Norris at [facility]. Risks benefits and expectations were discussed with the patient. Patient understand risks, benefits and expectations and wishes to proceed.   

## 2015-02-01 ENCOUNTER — Encounter (HOSPITAL_COMMUNITY)
Admission: RE | Admit: 2015-02-01 | Discharge: 2015-02-01 | Disposition: A | Discharge status: Home / Self Care | Payer: Medicare Other | Source: Ambulatory Visit | Attending: Orthopedic Surgery | Admitting: Orthopedic Surgery

## 2015-02-01 ENCOUNTER — Encounter (HOSPITAL_COMMUNITY): Payer: Self-pay

## 2015-02-01 HISTORY — DX: Gastro-esophageal reflux disease without esophagitis: K21.9

## 2015-02-01 LAB — CBC
HEMATOCRIT: 39.1 % (ref 39.0–52.0)
HEMOGLOBIN: 12.2 g/dL — AB (ref 13.0–17.0)
MCH: 26.6 pg (ref 26.0–34.0)
MCHC: 31.2 g/dL (ref 30.0–36.0)
MCV: 85.2 fL (ref 78.0–100.0)
Platelets: 167 10*3/uL (ref 150–400)
RBC: 4.59 MIL/uL (ref 4.22–5.81)
RDW: 19.6 % — ABNORMAL HIGH (ref 11.5–15.5)
WBC: 7.2 10*3/uL (ref 4.0–10.5)

## 2015-02-01 LAB — BASIC METABOLIC PANEL
Anion gap: 5 (ref 5–15)
BUN: 30 mg/dL — ABNORMAL HIGH (ref 6–20)
CO2: 26 mmol/L (ref 22–32)
Calcium: 9 mg/dL (ref 8.9–10.3)
Chloride: 109 mmol/L (ref 101–111)
Creatinine, Ser: 1.58 mg/dL — ABNORMAL HIGH (ref 0.61–1.24)
GFR calc Af Amer: 47 mL/min — ABNORMAL LOW (ref >60)
GFR calc non Af Amer: 40 mL/min — ABNORMAL LOW (ref >60)
GLUCOSE: 90 mg/dL (ref 65–99)
POTASSIUM: 4.5 mmol/L (ref 3.5–5.1)
Sodium: 140 mmol/L (ref 135–145)

## 2015-02-01 LAB — SURGICAL PCR SCREEN
MRSA, PCR: NEGATIVE
STAPHYLOCOCCUS AUREUS: NEGATIVE

## 2015-02-01 MED ORDER — CHLORHEXIDINE GLUCONATE 4 % EX LIQD: 60.0000 mL | Freq: Once | CUTANEOUS | Status: DC

## 2015-02-01 NOTE — Progress Notes (Signed)
Anesthesia Chart Review:  Pt is 80 year old male scheduled for R reverse total shoulder arthroplasty on 03-09-2015 with Dr. Ranell Patrick.   [facility] to [facility] for urgent care needs. Cardiologist is Dr. Eldridge Dace, last office visit 05/04/2014.  PMH includes: CAD (stenting 2007, 2010, 2013), HTN, RA, CKD. Former smoker. BMI 22.  Pt recently hospitalized 5/21-5/23/2016 for CAP  Medications include: ASA, plavix. Pt is to hold plavix 5 days prior to surgery.  Preoperative labs reviewed. Cr 1.58. Results for last 2 years range from 1.5-1.7.   EKG 01/06/2015: Sinus tachycardia. Multiform ventricular premature complexes. Repol abnrm suggests ischemia, lateral leads.  Carotid duplex US 07/27/2014:  -Heterogeneous plaque, bilaterally. -Stable, 40-59% [name] stenosis. -Stable, 1-39% LICA stenosis. -Normal subclavian arteries, bilaterally. -Patent vertebral arteries with antegrade flow.  Echo 02/21/2014: - Left ventricle: The cavity size was normal. There was mild concentric hypertrophy. Systolic function was normal. The estimated ejection fraction was in the range of 60% to 65%. Wall motion was normal; there were no regional wall motion abnormalities. Doppler parameters are consistent with abnormal left ventricular relaxation (grade 1 diastolic dysfunction). There was no evidence of elevated ventricular filling pressure byDoppler parameters. - Aortic valve: Trileaflet; normal thickness leaflets. There was no regurgitation. - Mitral valve: Calcified annulus. There was mild regurgitation. - Left atrium: The atrium was normal in size. - Right ventricle: Systolic function was normal. - Right atrium: The atrium was normal in size. - Tricuspid valve: Transvalvular velocity was within the normal range. There was no regurgitation. - Pericardium, extracardiac: There was no pericardial effusion.  Cardiac cath 03/04/2012: 1. Patent left main coronary artery. 2. Successful overlapping drug-eluting stent  placement in the proximal to mid left anterior descending artery with a 3.0 x 32 Promus elements and 3.5 x 16 Promus element stents. These were placed to treat in-stent restenosis in the mid LAD and de novo coronary artery artery disease in the proximal LAD.  3. Patent stent in the first obtuse marginal.  4. Patent stent in the mid to distal right coronary artery with 40-50% in-stent restenosis. Moderate distal RCA disease, but patent prior Rotablator result.  5.  LVEDP 18 mmHg. Ejection fraction not assessed.  Pt has cardiac clearance from Dr. Eldridge Dace for procedure in telephone encounter in Epic dated 10/26/2014.   If no changes, I anticipate pt can proceed with surgery as scheduled.  [name] [name], [name] [name] [facility]/Anesthesiology Phone: [phone] 02/01/2015 3:05 PM

## 2015-02-01 NOTE — Progress Notes (Signed)
   02/01/15 1049  OBSTRUCTIVE SLEEP APNEA  Have you ever been diagnosed with sleep apnea through a sleep study? No  Do you snore loudly (loud enough to be heard through closed doors)?  1  Do you often feel tired, fatigued, or sleepy during the daytime? 0  Has anyone observed you stop breathing during your sleep? 0  Do you have, or are you being treated for high blood pressure? 1  BMI more than 35 kg/m2? 0  Neck circumference greater than 40 cm/16 inches? 0  Gender: 1

## 2015-02-01 NOTE — Pre-Procedure Instructions (Signed)
    Rajohn Henery  02/01/2015      [facility] - [place], [place] - [address] [place] [address] Phone: [phone] Fax: [phone]    Your procedure is scheduled on Friday, 2015/02/28 at 10:00 AM.  Report to [facility] Admitting at 8:00 A.M.  Call this number if you have problems the morning of surgery:  [phone]   Remember:  Do not eat food or drink liquids after midnight.   Take these medicines the morning of surgery with A SIP OF WATER Acetaminophen (Tylenol), Amlodipine (Norvasc), All inhalers, Isosorbide Mononitrate (Imdur), levothyroxine (Synthroid), Prednisone (Deltasone), Ranitidine (Zantac).   Stop taking: Aspirin, Fish Oil, and Plavix 5 days prior to surgery.  No Goody's, BC's, Aleve, Naproxen, Ibuprofen, or any herbal medications.    Do not wear jewelry.  Do not wear lotions, powders, or colognes.  You may wear deodorant.  Men may shave face and neck.  Do not bring valuables to the hospital.  [facility] is not responsible for any belongings or valuables.  Contacts, dentures or bridgework may not be worn into surgery.  Leave your suitcase in the car.  After surgery it may be brought to your room.  For patients admitted to the hospital, discharge time will be determined by your treatment team.  Patients discharged the day of surgery will not be allowed to drive home.   Special instructions:  See attached.   Please read over the following fact sheets that you were given. Pain Booklet, Coughing and Deep Breathing, MRSA Information and Surgical Site Infection Prevention

## 2015-02-08 NOTE — Progress Notes (Signed)
Spoke with patient and instructed him to arrive at 530 am 02/05/2015.

## 2015-02-09 ENCOUNTER — Inpatient Hospital Stay (HOSPITAL_COMMUNITY): Payer: Medicare Other | Admitting: Emergency Medicine

## 2015-02-09 ENCOUNTER — Encounter (HOSPITAL_COMMUNITY): Admission: RE | Disposition: E | Payer: Self-pay | Source: Ambulatory Visit | Attending: Orthopedic Surgery

## 2015-02-09 ENCOUNTER — Inpatient Hospital Stay (HOSPITAL_COMMUNITY): Payer: Medicare Other

## 2015-02-09 ENCOUNTER — Inpatient Hospital Stay (HOSPITAL_COMMUNITY)
Admission: RE | Admit: 2015-02-09 | Discharge: 2015-03-19 | DRG: 483 | Disposition: E | Payer: Medicare Other | Source: Ambulatory Visit | Attending: Orthopedic Surgery | Admitting: Orthopedic Surgery

## 2015-02-09 ENCOUNTER — Inpatient Hospital Stay (HOSPITAL_COMMUNITY): Payer: Medicare Other | Admitting: Certified Registered Nurse Anesthetist

## 2015-02-09 ENCOUNTER — Encounter (HOSPITAL_COMMUNITY): Payer: Self-pay | Admitting: Certified Registered Nurse Anesthetist

## 2015-02-09 DIAGNOSIS — N189 Chronic kidney disease, unspecified: Secondary | ICD-10-CM | POA: Diagnosis not present

## 2015-02-09 DIAGNOSIS — J441 Chronic obstructive pulmonary disease with (acute) exacerbation: Secondary | ICD-10-CM | POA: Diagnosis present

## 2015-02-09 DIAGNOSIS — I5033 Acute on chronic diastolic (congestive) heart failure: Secondary | ICD-10-CM | POA: Diagnosis not present

## 2015-02-09 DIAGNOSIS — Z91013 Allergy to seafood: Secondary | ICD-10-CM

## 2015-02-09 DIAGNOSIS — I129 Hypertensive chronic kidney disease with stage 1 through stage 4 chronic kidney disease, or unspecified chronic kidney disease: Secondary | ICD-10-CM | POA: Diagnosis present

## 2015-02-09 DIAGNOSIS — I509 Heart failure, unspecified: Secondary | ICD-10-CM

## 2015-02-09 DIAGNOSIS — Z79899 Other long term (current) drug therapy: Secondary | ICD-10-CM

## 2015-02-09 DIAGNOSIS — M19011 Primary osteoarthritis, right shoulder: Principal | ICD-10-CM | POA: Diagnosis present

## 2015-02-09 DIAGNOSIS — R6520 Severe sepsis without septic shock: Secondary | ICD-10-CM | POA: Diagnosis not present

## 2015-02-09 DIAGNOSIS — I471 Supraventricular tachycardia: Secondary | ICD-10-CM | POA: Diagnosis not present

## 2015-02-09 DIAGNOSIS — Z87891 Personal history of nicotine dependence: Secondary | ICD-10-CM

## 2015-02-09 DIAGNOSIS — R1310 Dysphagia, unspecified: Secondary | ICD-10-CM | POA: Diagnosis present

## 2015-02-09 DIAGNOSIS — R0902 Hypoxemia: Secondary | ICD-10-CM

## 2015-02-09 DIAGNOSIS — I248 Other forms of acute ischemic heart disease: Secondary | ICD-10-CM | POA: Diagnosis present

## 2015-02-09 DIAGNOSIS — Z9861 Coronary angioplasty status: Secondary | ICD-10-CM

## 2015-02-09 DIAGNOSIS — Z9842 Cataract extraction status, left eye: Secondary | ICD-10-CM | POA: Diagnosis not present

## 2015-02-09 DIAGNOSIS — Z9841 Cataract extraction status, right eye: Secondary | ICD-10-CM

## 2015-02-09 DIAGNOSIS — E039 Hypothyroidism, unspecified: Secondary | ICD-10-CM | POA: Diagnosis present

## 2015-02-09 DIAGNOSIS — Z96643 Presence of artificial hip joint, bilateral: Secondary | ICD-10-CM | POA: Diagnosis present

## 2015-02-09 DIAGNOSIS — J189 Pneumonia, unspecified organism: Secondary | ICD-10-CM | POA: Diagnosis not present

## 2015-02-09 DIAGNOSIS — I5031 Acute diastolic (congestive) heart failure: Secondary | ICD-10-CM | POA: Diagnosis present

## 2015-02-09 DIAGNOSIS — E780 Pure hypercholesterolemia: Secondary | ICD-10-CM | POA: Diagnosis present

## 2015-02-09 DIAGNOSIS — R000 Tachycardia, unspecified: Secondary | ICD-10-CM | POA: Clinically undetermined

## 2015-02-09 DIAGNOSIS — Z7902 Long term (current) use of antithrombotics/antiplatelets: Secondary | ICD-10-CM

## 2015-02-09 DIAGNOSIS — Z96698 Presence of other orthopedic joint implants: Secondary | ICD-10-CM | POA: Diagnosis present

## 2015-02-09 DIAGNOSIS — I2510 Atherosclerotic heart disease of native coronary artery without angina pectoris: Secondary | ICD-10-CM | POA: Diagnosis present

## 2015-02-09 DIAGNOSIS — E876 Hypokalemia: Secondary | ICD-10-CM | POA: Diagnosis not present

## 2015-02-09 DIAGNOSIS — N183 Chronic kidney disease, stage 3 unspecified: Secondary | ICD-10-CM | POA: Diagnosis present

## 2015-02-09 DIAGNOSIS — M069 Rheumatoid arthritis, unspecified: Secondary | ICD-10-CM | POA: Diagnosis present

## 2015-02-09 DIAGNOSIS — R0603 Acute respiratory distress: Secondary | ICD-10-CM

## 2015-02-09 DIAGNOSIS — Z7952 Long term (current) use of systemic steroids: Secondary | ICD-10-CM | POA: Diagnosis not present

## 2015-02-09 DIAGNOSIS — I2699 Other pulmonary embolism without acute cor pulmonale: Secondary | ICD-10-CM

## 2015-02-09 DIAGNOSIS — A419 Sepsis, unspecified organism: Secondary | ICD-10-CM | POA: Diagnosis not present

## 2015-02-09 DIAGNOSIS — Z515 Encounter for palliative care: Secondary | ICD-10-CM | POA: Insufficient documentation

## 2015-02-09 DIAGNOSIS — Z96619 Presence of unspecified artificial shoulder joint: Secondary | ICD-10-CM

## 2015-02-09 DIAGNOSIS — Z888 Allergy status to other drugs, medicaments and biological substances status: Secondary | ICD-10-CM

## 2015-02-09 DIAGNOSIS — K219 Gastro-esophageal reflux disease without esophagitis: Secondary | ICD-10-CM | POA: Diagnosis present

## 2015-02-09 DIAGNOSIS — M25511 Pain in right shoulder: Secondary | ICD-10-CM | POA: Diagnosis present

## 2015-02-09 DIAGNOSIS — K7460 Unspecified cirrhosis of liver: Secondary | ICD-10-CM

## 2015-02-09 DIAGNOSIS — Z96612 Presence of left artificial shoulder joint: Secondary | ICD-10-CM | POA: Diagnosis present

## 2015-02-09 DIAGNOSIS — I4891 Unspecified atrial fibrillation: Secondary | ICD-10-CM | POA: Diagnosis not present

## 2015-02-09 DIAGNOSIS — R0602 Shortness of breath: Secondary | ICD-10-CM | POA: Diagnosis not present

## 2015-02-09 DIAGNOSIS — Z66 Do not resuscitate: Secondary | ICD-10-CM | POA: Diagnosis present

## 2015-02-09 DIAGNOSIS — D696 Thrombocytopenia, unspecified: Secondary | ICD-10-CM | POA: Diagnosis not present

## 2015-02-09 DIAGNOSIS — J9601 Acute respiratory failure with hypoxia: Secondary | ICD-10-CM | POA: Diagnosis not present

## 2015-02-09 DIAGNOSIS — Z7982 Long term (current) use of aspirin: Secondary | ICD-10-CM

## 2015-02-09 DIAGNOSIS — I10 Essential (primary) hypertension: Secondary | ICD-10-CM | POA: Diagnosis present

## 2015-02-09 DIAGNOSIS — Z961 Presence of intraocular lens: Secondary | ICD-10-CM | POA: Diagnosis present

## 2015-02-09 DIAGNOSIS — R0600 Dyspnea, unspecified: Secondary | ICD-10-CM | POA: Diagnosis not present

## 2015-02-09 DIAGNOSIS — N179 Acute kidney failure, unspecified: Secondary | ICD-10-CM | POA: Diagnosis not present

## 2015-02-09 DIAGNOSIS — D638 Anemia in other chronic diseases classified elsewhere: Secondary | ICD-10-CM | POA: Diagnosis present

## 2015-02-09 DIAGNOSIS — E871 Hypo-osmolality and hyponatremia: Secondary | ICD-10-CM | POA: Diagnosis present

## 2015-02-09 DIAGNOSIS — Z955 Presence of coronary angioplasty implant and graft: Secondary | ICD-10-CM | POA: Diagnosis not present

## 2015-02-09 DIAGNOSIS — R410 Disorientation, unspecified: Secondary | ICD-10-CM | POA: Diagnosis not present

## 2015-02-09 DIAGNOSIS — Z886 Allergy status to analgesic agent status: Secondary | ICD-10-CM

## 2015-02-09 DIAGNOSIS — Z96611 Presence of right artificial shoulder joint: Secondary | ICD-10-CM

## 2015-02-09 DIAGNOSIS — T451X5A Adverse effect of antineoplastic and immunosuppressive drugs, initial encounter: Secondary | ICD-10-CM | POA: Diagnosis present

## 2015-02-09 HISTORY — DX: Unspecified cirrhosis of liver: K74.60

## 2015-02-09 HISTORY — PX: REVERSE SHOULDER ARTHROPLASTY: SHX5054

## 2015-02-09 HISTORY — DX: Interstitial emphysema: J98.2

## 2015-02-09 HISTORY — DX: Unspecified diastolic (congestive) heart failure: I50.30

## 2015-02-09 LAB — BLOOD GAS, ARTERIAL
ACID-BASE DEFICIT: 3.1 mmol/L — AB (ref 0.0–2.0)
Bicarbonate: 20.6 mEq/L (ref 20.0–24.0)
Drawn by: 235881
FIO2: 1 %
O2 Saturation: 95.7 %
PCO2 ART: 32.2 mmHg — AB (ref 35.0–45.0)
Patient temperature: 98.6
TCO2: 21.6 mmol/L (ref 0–100)
pH, Arterial: 7.423 (ref 7.350–7.450)
pO2, Arterial: 78.7 mmHg — ABNORMAL LOW (ref 80.0–100.0)

## 2015-02-09 LAB — CK TOTAL AND CKMB (NOT AT ARMC)
CK TOTAL: 168 U/L (ref 49–397)
CK, MB: 4.3 ng/mL (ref 0.5–5.0)
RELATIVE INDEX: 2.6 — AB (ref 0.0–2.5)

## 2015-02-09 LAB — TROPONIN I: TROPONIN I: 0.04 ng/mL — AB (ref <0.031)

## 2015-02-09 SURGERY — ARTHROPLASTY, SHOULDER, TOTAL, REVERSE
Anesthesia: Regional | Site: Shoulder | Laterality: Right

## 2015-02-09 MED ORDER — MORPHINE SULFATE 2 MG/ML IJ SOLN: 1.0000 mg | INTRAMUSCULAR | Status: DC | PRN

## 2015-02-09 MED ORDER — MIDAZOLAM HCL 5 MG/5ML IJ SOLN
INTRAMUSCULAR | Status: DC | PRN
  Administered 2015-02-09: 1 mg via INTRAVENOUS

## 2015-02-09 MED ORDER — FUROSEMIDE 10 MG/ML IJ SOLN
20.0000 mg | Freq: Once | INTRAMUSCULAR | Status: AC
  Administered 2015-02-09: 20 mg via INTRAVENOUS

## 2015-02-09 MED ORDER — ASPIRIN EC 81 MG PO TBEC
81.0000 mg | DELAYED_RELEASE_TABLET | Freq: Every day | ORAL | Status: DC
  Administered 2015-02-09 – 2015-02-14 (×6): 81 mg via ORAL
  Filled 2015-02-09 (×10): qty 1

## 2015-02-09 MED ORDER — SIMVASTATIN 40 MG PO TABS
40.0000 mg | ORAL_TABLET | Freq: Every day | ORAL | Status: DC
  Administered 2015-02-09: 40 mg via ORAL
  Filled 2015-02-09: qty 1

## 2015-02-09 MED ORDER — CEFAZOLIN SODIUM-DEXTROSE 2-3 GM-% IV SOLR
2.0000 g | INTRAVENOUS | Status: AC
  Administered 2015-02-09: 2 g via INTRAVENOUS

## 2015-02-09 MED ORDER — LACTATED RINGERS IV SOLN
INTRAVENOUS | Status: DC | PRN
  Administered 2015-02-09 (×2): via INTRAVENOUS

## 2015-02-09 MED ORDER — ADULT MULTIVITAMIN W/MINERALS CH
1.0000 | ORAL_TABLET | Freq: Every day | ORAL | Status: DC
  Administered 2015-02-10 – 2015-02-13 (×4): 1 via ORAL
  Filled 2015-02-09 (×7): qty 1

## 2015-02-09 MED ORDER — BUPIVACAINE-EPINEPHRINE (PF) 0.5% -1:200000 IJ SOLN
INTRAMUSCULAR | Status: DC | PRN
  Administered 2015-02-09: 25 mL via PERINEURAL

## 2015-02-09 MED ORDER — PHENYLEPHRINE HCL 10 MG/ML IJ SOLN
10.0000 mg | INTRAMUSCULAR | Status: DC | PRN
  Administered 2015-02-09: 25 ug/min via INTRAVENOUS

## 2015-02-09 MED ORDER — OMEGA-3 FATTY ACIDS 1000 MG PO CAPS: 1.0000 g | ORAL_CAPSULE | Freq: Every day | ORAL | Status: DC

## 2015-02-09 MED ORDER — OMEGA-3-ACID ETHYL ESTERS 1 G PO CAPS
1.0000 g | ORAL_CAPSULE | Freq: Every day | ORAL | Status: DC
  Administered 2015-02-10 – 2015-02-14 (×5): 1 g via ORAL
  Filled 2015-02-09 (×9): qty 1

## 2015-02-09 MED ORDER — HYDROCODONE-ACETAMINOPHEN 5-325 MG PO TABS
1.0000 | ORAL_TABLET | ORAL | Status: DC | PRN
  Administered 2015-02-09 – 2015-02-15 (×6): 1 via ORAL
  Filled 2015-02-09 (×7): qty 1

## 2015-02-09 MED ORDER — ONDANSETRON HCL 4 MG PO TABS: 4.0000 mg | ORAL_TABLET | Freq: Four times a day (QID) | ORAL | Status: DC | PRN

## 2015-02-09 MED ORDER — ISOSORBIDE MONONITRATE ER 30 MG PO TB24
30.0000 mg | ORAL_TABLET | Freq: Every day | ORAL | Status: DC
  Administered 2015-02-09 – 2015-02-10 (×2): 30 mg via ORAL
  Filled 2015-02-09 (×2): qty 1

## 2015-02-09 MED ORDER — FENTANYL CITRATE (PF) 250 MCG/5ML IJ SOLN
INTRAMUSCULAR | Status: AC
  Filled 2015-02-09: qty 5

## 2015-02-09 MED ORDER — METOPROLOL TARTRATE 12.5 MG HALF TABLET
12.5000 mg | ORAL_TABLET | Freq: Two times a day (BID) | ORAL | Status: DC
Start: 2015-02-09 — End: 2015-02-11
  Administered 2015-02-09 – 2015-02-10 (×2): 12.5 mg via ORAL
  Filled 2015-02-09 (×3): qty 1

## 2015-02-09 MED ORDER — AMLODIPINE BESYLATE 2.5 MG PO TABS
2.5000 mg | ORAL_TABLET | Freq: Every day | ORAL | Status: DC
  Administered 2015-02-09 – 2015-02-10 (×2): 2.5 mg via ORAL
  Filled 2015-02-09: qty 1

## 2015-02-09 MED ORDER — ETANERCEPT 25 MG/0.5ML ~~LOC~~ SOSY: 25.0000 mg | PREFILLED_SYRINGE | SUBCUTANEOUS | Status: DC

## 2015-02-09 MED ORDER — CLOPIDOGREL BISULFATE 75 MG PO TABS
75.0000 mg | ORAL_TABLET | Freq: Every day | ORAL | Status: DC
  Administered 2015-02-09 – 2015-02-14 (×6): 75 mg via ORAL
  Filled 2015-02-09 (×8): qty 1

## 2015-02-09 MED ORDER — DOCUSATE SODIUM 100 MG PO CAPS
100.0000 mg | ORAL_CAPSULE | Freq: Two times a day (BID) | ORAL | Status: DC
  Administered 2015-02-09 – 2015-02-13 (×7): 100 mg via ORAL
  Filled 2015-02-09 (×15): qty 1

## 2015-02-09 MED ORDER — ROCURONIUM BROMIDE 100 MG/10ML IV SOLN
INTRAVENOUS | Status: DC | PRN
  Administered 2015-02-09: 20 mg via INTRAVENOUS

## 2015-02-09 MED ORDER — ACETAMINOPHEN 325 MG PO TABS
650.0000 mg | ORAL_TABLET | Freq: Four times a day (QID) | ORAL | Status: DC | PRN
  Administered 2015-02-10: 650 mg via ORAL

## 2015-02-09 MED ORDER — ROCURONIUM BROMIDE 50 MG/5ML IV SOLN
INTRAVENOUS | Status: AC
  Filled 2015-02-09: qty 1

## 2015-02-09 MED ORDER — NEOSTIGMINE METHYLSULFATE 10 MG/10ML IV SOLN
INTRAVENOUS | Status: DC | PRN
  Administered 2015-02-09: 2 mg via INTRAVENOUS

## 2015-02-09 MED ORDER — TRAMADOL HCL 50 MG PO TABS: 50.0000 mg | ORAL_TABLET | Freq: Four times a day (QID) | ORAL | Status: AC | PRN

## 2015-02-09 MED ORDER — FENTANYL CITRATE (PF) 100 MCG/2ML IJ SOLN: 25.0000 ug | INTRAMUSCULAR | Status: DC | PRN

## 2015-02-09 MED ORDER — FAMOTIDINE 20 MG PO TABS
20.0000 mg | ORAL_TABLET | Freq: Every day | ORAL | Status: DC
  Administered 2015-02-10 – 2015-02-12 (×3): 20 mg via ORAL
  Filled 2015-02-09 (×3): qty 1

## 2015-02-09 MED ORDER — TRAMADOL HCL 50 MG PO TABS
50.0000 mg | ORAL_TABLET | Freq: Four times a day (QID) | ORAL | Status: DC | PRN
  Administered 2015-02-10 (×2): 50 mg via ORAL
  Filled 2015-02-09 (×2): qty 1

## 2015-02-09 MED ORDER — CEFAZOLIN SODIUM-DEXTROSE 2-3 GM-% IV SOLR
2.0000 g | Freq: Four times a day (QID) | INTRAVENOUS | Status: AC
  Administered 2015-02-09 – 2015-02-10 (×3): 2 g via INTRAVENOUS
  Filled 2015-02-09 (×3): qty 50

## 2015-02-09 MED ORDER — MENTHOL 3 MG MT LOZG: 1.0000 | LOZENGE | OROMUCOSAL | Status: DC | PRN

## 2015-02-09 MED ORDER — METOCLOPRAMIDE HCL 5 MG PO TABS
5.0000 mg | ORAL_TABLET | Freq: Three times a day (TID) | ORAL | Status: DC | PRN
  Filled 2015-02-09: qty 2

## 2015-02-09 MED ORDER — ARTIFICIAL TEARS OP OINT
TOPICAL_OINTMENT | OPHTHALMIC | Status: AC
  Filled 2015-02-09: qty 3.5

## 2015-02-09 MED ORDER — METOCLOPRAMIDE HCL 5 MG/ML IJ SOLN
5.0000 mg | Freq: Three times a day (TID) | INTRAMUSCULAR | Status: DC | PRN
  Filled 2015-02-09: qty 2

## 2015-02-09 MED ORDER — MIDAZOLAM HCL 2 MG/2ML IJ SOLN
INTRAMUSCULAR | Status: AC
  Filled 2015-02-09: qty 2

## 2015-02-09 MED ORDER — NITROGLYCERIN 0.4 MG SL SUBL
0.4000 mg | SUBLINGUAL_TABLET | SUBLINGUAL | Status: DC | PRN
  Filled 2015-02-09 (×2): qty 1

## 2015-02-09 MED ORDER — LEVOTHYROXINE SODIUM 100 MCG PO TABS
100.0000 ug | ORAL_TABLET | Freq: Every day | ORAL | Status: DC
  Administered 2015-02-10 – 2015-02-15 (×6): 100 ug via ORAL
  Filled 2015-02-09 (×10): qty 1

## 2015-02-09 MED ORDER — BUPIVACAINE-EPINEPHRINE (PF) 0.25% -1:200000 IJ SOLN
INTRAMUSCULAR | Status: AC
  Filled 2015-02-09: qty 30

## 2015-02-09 MED ORDER — FUROSEMIDE 10 MG/ML IJ SOLN
INTRAMUSCULAR | Status: AC
  Filled 2015-02-09: qty 2

## 2015-02-09 MED ORDER — SODIUM CHLORIDE 0.9 % IV SOLN
INTRAVENOUS | Status: DC
  Administered 2015-02-09: 13:00:00 via INTRAVENOUS

## 2015-02-09 MED ORDER — BUPIVACAINE-EPINEPHRINE 0.25% -1:200000 IJ SOLN
INTRAMUSCULAR | Status: DC | PRN
  Administered 2015-02-09: 9 mL

## 2015-02-09 MED ORDER — PROPOFOL 10 MG/ML IV BOLUS
INTRAVENOUS | Status: AC
  Filled 2015-02-09: qty 20

## 2015-02-09 MED ORDER — SODIUM CHLORIDE 0.9 % IR SOLN
Status: DC | PRN
  Administered 2015-02-09: 1000 mL

## 2015-02-09 MED ORDER — ONDANSETRON HCL 4 MG/2ML IJ SOLN
INTRAMUSCULAR | Status: AC
  Filled 2015-02-09: qty 2

## 2015-02-09 MED ORDER — GLYCOPYRROLATE 0.2 MG/ML IJ SOLN
INTRAMUSCULAR | Status: DC | PRN
  Administered 2015-02-09: 0.2 mg via INTRAVENOUS

## 2015-02-09 MED ORDER — ACETAMINOPHEN ER 650 MG PO TBCR: 650.0000 mg | EXTENDED_RELEASE_TABLET | Freq: Two times a day (BID) | ORAL | Status: DC | PRN

## 2015-02-09 MED ORDER — LABETALOL HCL 5 MG/ML IV SOLN
INTRAVENOUS | Status: DC | PRN
  Administered 2015-02-09: 5 mg via INTRAVENOUS

## 2015-02-09 MED ORDER — GUAIFENESIN ER 600 MG PO TB12
600.0000 mg | ORAL_TABLET | Freq: Two times a day (BID) | ORAL | Status: DC | PRN
  Filled 2015-02-09: qty 1

## 2015-02-09 MED ORDER — ESMOLOL HCL 10 MG/ML IV SOLN
INTRAVENOUS | Status: DC | PRN
  Administered 2015-02-09: 20 mg via INTRAVENOUS

## 2015-02-09 MED ORDER — LIDOCAINE HCL 4 % MT SOLN
OROMUCOSAL | Status: DC | PRN
  Administered 2015-02-09: 4 mL via TOPICAL

## 2015-02-09 MED ORDER — HYDROCODONE-ACETAMINOPHEN 5-325 MG PO TABS: 1.0000 | ORAL_TABLET | Freq: Four times a day (QID) | ORAL | Status: AC | PRN

## 2015-02-09 MED ORDER — FENTANYL CITRATE (PF) 100 MCG/2ML IJ SOLN
INTRAMUSCULAR | Status: DC | PRN
  Administered 2015-02-09: 25 ug via INTRAVENOUS
  Administered 2015-02-09: 50 ug via INTRAVENOUS

## 2015-02-09 MED ORDER — PROMETHAZINE HCL 25 MG/ML IJ SOLN: 6.2500 mg | INTRAMUSCULAR | Status: DC | PRN

## 2015-02-09 MED ORDER — ONDANSETRON HCL 4 MG/2ML IJ SOLN
INTRAMUSCULAR | Status: DC | PRN
  Administered 2015-02-09: 4 mg via INTRAVENOUS

## 2015-02-09 MED ORDER — PROPOFOL 10 MG/ML IV BOLUS
INTRAVENOUS | Status: DC | PRN
  Administered 2015-02-09: 100 mg via INTRAVENOUS

## 2015-02-09 MED ORDER — POLYETHYLENE GLYCOL 3350 17 G PO PACK
17.0000 g | PACK | Freq: Every day | ORAL | Status: DC | PRN
  Filled 2015-02-09: qty 1

## 2015-02-09 MED ORDER — ONDANSETRON HCL 4 MG/2ML IJ SOLN: 4.0000 mg | Freq: Four times a day (QID) | INTRAMUSCULAR | Status: DC | PRN

## 2015-02-09 MED ORDER — IPRATROPIUM-ALBUTEROL 0.5-2.5 (3) MG/3ML IN SOLN
3.0000 mL | Freq: Four times a day (QID) | RESPIRATORY_TRACT | Status: DC | PRN
  Administered 2015-02-09 – 2015-02-15 (×2): 3 mL via RESPIRATORY_TRACT
  Filled 2015-02-09 (×2): qty 3

## 2015-02-09 MED ORDER — PREDNISONE 5 MG PO TABS: 5.0000 mg | ORAL_TABLET | Freq: Every day | ORAL | Status: DC

## 2015-02-09 MED ORDER — IPRATROPIUM-ALBUTEROL 20-100 MCG/ACT IN AERS: 1.0000 | INHALATION_SPRAY | Freq: Four times a day (QID) | RESPIRATORY_TRACT | Status: DC | PRN

## 2015-02-09 MED ORDER — LIDOCAINE HCL (CARDIAC) 20 MG/ML IV SOLN
INTRAVENOUS | Status: DC | PRN
  Administered 2015-02-09: 60 mg via INTRATRACHEAL

## 2015-02-09 MED ORDER — ACETAMINOPHEN 650 MG RE SUPP: 650.0000 mg | Freq: Four times a day (QID) | RECTAL | Status: DC | PRN

## 2015-02-09 MED ORDER — PHENOL 1.4 % MT LIQD: 1.0000 | OROMUCOSAL | Status: DC | PRN

## 2015-02-09 SURGICAL SUPPLY — 70 items
BIT DRILL 170X2.5X (BIT) IMPLANT
BIT DRILL 5/64X5 DISP (BIT) ×3 IMPLANT
BIT DRL 170X2.5X (BIT)
BLADE SAG 18X100X1.27 (BLADE) ×3 IMPLANT
BOWL SMART MIX CTS (DISPOSABLE) ×3 IMPLANT
CAPT SHLDR REVTOTAL 1 ×3 IMPLANT
CEMENT BONE DEPUY (Cement) ×3 IMPLANT
CHLORAPREP W/TINT 26ML (MISCELLANEOUS) ×3 IMPLANT
CLOSURE WOUND 1/2 X4 (GAUZE/BANDAGES/DRESSINGS) ×1
COVER SURGICAL LIGHT HANDLE (MISCELLANEOUS) ×3 IMPLANT
DRAPE IMP U-DRAPE 54X76 (DRAPES) IMPLANT
DRAPE INCISE 23X17 IOBAN STRL (DRAPES) ×4
DRAPE INCISE IOBAN 23X17 STRL (DRAPES) ×2 IMPLANT
DRAPE INCISE IOBAN 66X45 STRL (DRAPES) IMPLANT
DRAPE ORTHO SPLIT 77X108 STRL (DRAPES) ×4
DRAPE SURG ORHT 6 SPLT 77X108 (DRAPES) ×2 IMPLANT
DRAPE U-SHAPE 47X51 STRL (DRAPES) ×3 IMPLANT
DRAPE X-RAY CASS 24X20 (DRAPES) IMPLANT
DRILL 2.5 (BIT)
DRSG ADAPTIC 3X8 NADH LF (GAUZE/BANDAGES/DRESSINGS) ×3 IMPLANT
DRSG PAD ABDOMINAL 8X10 ST (GAUZE/BANDAGES/DRESSINGS) ×3 IMPLANT
DURAPREP 26ML APPLICATOR (WOUND CARE) IMPLANT
ELECT BLADE 4.0 EZ CLEAN MEGAD (MISCELLANEOUS) ×3
ELECT NEEDLE TIP 2.8 STRL (NEEDLE) ×3 IMPLANT
ELECT REM PT RETURN 9FT ADLT (ELECTROSURGICAL) ×3
ELECTRODE BLDE 4.0 EZ CLN MEGD (MISCELLANEOUS) ×1 IMPLANT
ELECTRODE REM PT RTRN 9FT ADLT (ELECTROSURGICAL) ×1 IMPLANT
GAUZE SPONGE 4X4 12PLY STRL (GAUZE/BANDAGES/DRESSINGS) ×3 IMPLANT
GLOVE BIOGEL PI ORTHO PRO 7.5 (GLOVE) ×2
GLOVE BIOGEL PI ORTHO PRO SZ8 (GLOVE) ×2
GLOVE ORTHO TXT STRL SZ7.5 (GLOVE) ×3 IMPLANT
GLOVE PI ORTHO PRO STRL 7.5 (GLOVE) ×1 IMPLANT
GLOVE PI ORTHO PRO STRL SZ8 (GLOVE) ×1 IMPLANT
GLOVE SURG ORTHO 8.5 STRL (GLOVE) ×3 IMPLANT
GOWN STRL REUS W/ TWL LRG LVL3 (GOWN DISPOSABLE) ×1 IMPLANT
GOWN STRL REUS W/ TWL XL LVL3 (GOWN DISPOSABLE) ×2 IMPLANT
GOWN STRL REUS W/TWL LRG LVL3 (GOWN DISPOSABLE) ×2
GOWN STRL REUS W/TWL XL LVL3 (GOWN DISPOSABLE) ×4
HANDPIECE INTERPULSE COAX TIP (DISPOSABLE)
KIT BASIN OR (CUSTOM PROCEDURE TRAY) ×3 IMPLANT
KIT ROOM TURNOVER OR (KITS) ×3 IMPLANT
MANIFOLD NEPTUNE II (INSTRUMENTS) ×3 IMPLANT
NEEDLE 1/2 CIR MAYO (NEEDLE) ×3 IMPLANT
NEEDLE HYPO 25GX1X1/2 BEV (NEEDLE) ×3 IMPLANT
NS IRRIG 1000ML POUR BTL (IV SOLUTION) ×3 IMPLANT
PACK SHOULDER (CUSTOM PROCEDURE TRAY) ×3 IMPLANT
PAD ARMBOARD 7.5X6 YLW CONV (MISCELLANEOUS) ×6 IMPLANT
PIN GUIDE 1.2 (PIN) IMPLANT
PIN GUIDE GLENOPHERE 1.5MX300M (PIN) ×3 IMPLANT
PIN METAGLENE 2.5 (PIN) IMPLANT
SET HNDPC FAN SPRY TIP SCT (DISPOSABLE) IMPLANT
SLING ARM LRG ADULT FOAM STRAP (SOFTGOODS) ×3 IMPLANT
SLING ARM MED ADULT FOAM STRAP (SOFTGOODS) IMPLANT
SPONGE LAP 18X18 X RAY DECT (DISPOSABLE) IMPLANT
SPONGE LAP 4X18 X RAY DECT (DISPOSABLE) ×3 IMPLANT
STRIP CLOSURE SKIN 1/2X4 (GAUZE/BANDAGES/DRESSINGS) ×2 IMPLANT
SUCTION FRAZIER TIP 10 FR DISP (SUCTIONS) ×3 IMPLANT
SUT FIBERWIRE #2 38 T-5 BLUE (SUTURE) ×6
SUT MNCRL AB 4-0 PS2 18 (SUTURE) ×3 IMPLANT
SUT VIC AB 2-0 CT1 27 (SUTURE) ×2
SUT VIC AB 2-0 CT1 TAPERPNT 27 (SUTURE) ×1 IMPLANT
SUT VICRYL 0 CT 1 36IN (SUTURE) ×3 IMPLANT
SUTURE FIBERWR #2 38 T-5 BLUE (SUTURE) ×2 IMPLANT
SYR CONTROL 10ML LL (SYRINGE) ×3 IMPLANT
TOWEL OR 17X24 6PK STRL BLUE (TOWEL DISPOSABLE) ×3 IMPLANT
TOWEL OR 17X26 10 PK STRL BLUE (TOWEL DISPOSABLE) ×3 IMPLANT
TOWER CARTRIDGE SMART MIX (DISPOSABLE) IMPLANT
TRAY FOLEY CATH 16FRSI W/METER (SET/KITS/TRAYS/PACK) IMPLANT
WATER STERILE IRR 1000ML POUR (IV SOLUTION) IMPLANT
YANKAUER SUCT BULB TIP NO VENT (SUCTIONS) ×3 IMPLANT

## 2015-02-09 NOTE — Progress Notes (Signed)
Called Dr Charlann Boxer to update patient's condition including recent VS's, ABG's, labs, CXR report and dependency on re-breather mask to maintain O2 SATs [dropped down from 100% to 92% < 3 minutes to allow patient to drink ice tea and swallow a few pills]. Orders received. Obtained name of his cardiologist from his wife - shared name with Dr. Charlann Boxer. Dr. Charlann Boxer will request Cardiology consult now. Updated 7 PM - 7 AM RN on new orders and plan to call Cardiologist.

## 2015-02-09 NOTE — Anesthesia Preprocedure Evaluation (Addendum)
Anesthesia Evaluation  Patient identified by MRN, date of birth, ID band Patient awake    Reviewed: Allergy & Precautions, NPO status , Patient's Chart, lab work & pertinent test results  Airway Mallampati: II  TM Distance: >3 FB Neck ROM: Limited    Dental   Pulmonary shortness of breath, former smoker,  breath sounds clear to auscultation        Cardiovascular hypertension, Pt. on medications + CAD, + Cardiac Stents and + Peripheral Vascular Disease Rhythm:Regular Rate:Normal  EF 60% on 02/2014 TTE. Valves okay.   Neuro/Psych negative neurological ROS     GI/Hepatic Neg liver ROS, GERD-  ,  Endo/Other  Hypothyroidism   Renal/GU CRFRenal disease     Musculoskeletal  (+) Arthritis -,   Abdominal   Peds  Hematology  (+) anemia ,   Anesthesia Other Findings   Reproductive/Obstetrics                            Anesthesia Physical Anesthesia Plan  ASA: III  Anesthesia Plan: General and Regional   Post-op Pain Management:    Induction: Intravenous  Airway Management Planned: Oral ETT  Additional Equipment:   Intra-op Plan:   Post-operative Plan: Extubation in OR  Informed Consent: I have reviewed the patients History and Physical, chart, labs and discussed the procedure including the risks, benefits and alternatives for the proposed anesthesia with the patient or authorized representative who has indicated his/her understanding and acceptance.   Dental advisory given  Plan Discussed with: CRNA  Anesthesia Plan Comments:         Anesthesia Quick Evaluation

## 2015-02-09 NOTE — Anesthesia Postprocedure Evaluation (Signed)
  Anesthesia Post-op Note  Patient: [name] [name]  Procedure(s) Performed: Procedure(s): RIGHT REVERSE SHOULDER TOTAL ARTHROPLASTY (Right)  Patient Location: PACU  Anesthesia Type:GA combined with regional for post-op pain  Level of Consciousness: awake, alert  and oriented  Airway and Oxygen Therapy: Patient Spontanous Breathing  Post-op Pain: none  Post-op Assessment: Post-op Vital signs reviewed              Post-op Vital Signs: Reviewed  Last Vitals:  Filed Vitals:   02/06/2015 1106  BP: 114/45  Pulse: 67  Temp: 36.4 C  Resp: 18    Complications: No apparent anesthesia complications

## 2015-02-09 NOTE — Consult Note (Addendum)
CARDIOLOGY CONSULT NOTE   Patient ID: [name] [name] MRN: [id number], DOB/AGE: June 26, 1935   Admit date: 02/13/2015 Date of Consult: 01/25/2015   Primary Physician: Georgann Housekeeper, MD Primary Cardiologist: Dr. Eldridge Dace  Pt. Profile  [phone] with CAD s/p LAD PCI (2011), RA on prednisone and etanercept, hypothyrodism on synthroid, CKD III, history of vascular congestion on CXR without frank history of CHF, normal EF 02/21/14, who has SOB s/p total right shoulder arthroplasty.   Problem List  Past Medical History  Diagnosis Date  . Rheumatoid arthritis(714.0)   . High cholesterol     takes Simvastatin daily  . Pneumonia     "has had walking pneumonia twice"  . Exertional dyspnea 03/04/12    "last few months"  . Chronic kidney disease 03/04/12    "running ~ 40%"  . Umbilical hernia   . Hypothyroidism     takes SYnthroid daily  . Hypertension     takes Amlodipine and Imdur daily  . Coronary artery disease     takes Plavix daily  . GERD (gastroesophageal reflux disease)     takes Zantac daily    Past Surgical History  Procedure Laterality Date  . Coronary angioplasty with stent placement  2007; 2010    "2; 1"  . Coronary angioplasty with stent placement  03/04/12    "2; total of 5 now"  . Tonsillectomy  1935  . Appendectomy  1956  . Total knee arthroplasty  2000    bilaterally  . Total hip arthroplasty      bilaterally  . Revision total hip arthroplasty      right; "4 times; it jumped out and had to put it back in twice; redid it @ least once""  . Total shoulder arthroplasty      left  . Foot surgery      "both; arthritis; cut ~ all the bones in my feet were crooked; couldn't walk"  . Cataract extraction w/ intraocular lens  implant, bilateral    . Left heart catheterization with coronary angiogram N/A 03/04/2012    Procedure: LEFT HEART CATHETERIZATION WITH CORONARY ANGIOGRAM;  Surgeon: Corky Crafts, MD;  Location: [facility] CATH LAB;  Service: Cardiovascular;  Laterality:  N/A;  . Joint replacement       Allergies  Allergies  Allergen Reactions  . Aspirin Swelling    Only high doses of aspirin (given for arthritis).; "swelled face, lips, etc"  . Shellfish Allergy Anaphylaxis, Nausea And Vomiting and Swelling  . Lisinopril Cough  . Pantoprazole Other (See Comments)    dizzy    HPI   57M with CAD s/p LAD PCI (2011), RA on prednisone and etanercept, hypothyrodism on synthroid, CKD III, history of vascular congestion on CXR without frank history of CHF, normal EF 02/21/14, who has SOB s/p total right shoulder arthroplasty.   [name] [name] underwent uncomplicated R total shoulder arthroplasty with Dr. Ranell Patrick today. No blood was administered. IVF not documented. He arrived to 5N around noon on 2L nasal cannula with borderline sats. He had progressive worsening of his dyspnea and at 6pm required NRB to maintain saturations. BP 154/116 around time of desaturation. His pain was well controlled. No other symptoms, including chest pain, chest pressure palpitations, edema. ABG was obtained: 7.42/32/78. CXR demonstrated small to moderate bilateral pleural effusions (L>R) with mild interstitial edema. ECG demonstrated NSR and iRBBB with PVCs, NSSTTWC, unchanged from prior. He was given 20mg  IV lasix by Dr. and cardiology was consulted. Initial troponin 0.04.   Of note,  he has been off his ASA and plavix since Saturday in preparation for surgery; these have been re-ordered. Not on diuretic at home. He has put out ~450cc of urine in response to the lasix but remains on NRB.   Inpatient Medications  . amLODipine  2.5 mg Oral Daily  . aspirin EC  81 mg Oral QHS  .  ceFAZolin (ANCEF) IV  2 g Intravenous Q6H  . clopidogrel  75 mg Oral QPC supper  . docusate sodium  100 mg Oral BID  . [START ON 02/10/2015] famotidine  20 mg Oral Daily  . isosorbide mononitrate  30 mg Oral QPC supper  . [START ON 02/10/2015] levothyroxine  100 mcg Oral QAC breakfast  . multivitamin with  minerals  1 tablet Oral Daily  . omega-3 acid ethyl esters  1 g Oral Q supper  . simvastatin  40 mg Oral QHS    Family History Family History  Problem Relation Age of Onset  . Heart disease Father   . Heart disease Brother   . Heart disease Brother   . Arthritis Mother      Social History History   Social History  . Marital Status: Married    Spouse Name: N/A  . Number of Children: N/A  . Years of Education: N/A   Occupational History  . Not on file.   Social History Main Topics  . Smoking status: Former Smoker -- 2.00 packs/day for 38 years    Types: Cigarettes    Quit date: 02/16/1987  . Smokeless tobacco: Never Used  . Alcohol Use: No     Comment: 03/04/12 "last drink of alcohol 1980's; used to only drink socially"  . Drug Use: No  . Sexual Activity: No   Other Topics Concern  . Not on file   Social History Narrative     Review of Systems  General:  No chills, fever, night sweats or weight changes.  Cardiovascular:  See HPI.  Dermatological: No rash, lesions/masses Respiratory: No cough or sputum production. +  dyspnea Urologic: No hematuria, dysuria Abdominal:   No nausea, vomiting, diarrhea, bright red blood per rectum, melena, or hematemesis Neurologic:  No visual changes, wkns, changes in mental status. All other systems reviewed and are otherwise negative except as noted above.  Physical Exam  Blood pressure 136/54, pulse 94, temperature 99.8 F (37.7 C), temperature source Axillary, resp. rate 24, weight 62.143 kg (137 lb), SpO2 98 %.  General: Pleasant, mild distress Psych: Normal affect. Neuro: Alert and oriented X 3.  HOH.  HEENT: NRB  Neck: Mildly increased JVP to 11cmH2O Lungs: Mild resp distress. Basilar rales noted Heart: RRR no s3, s4, or murmurs. Abdomen: Soft, non-tender, non-distended, BS + x 4.  Extremities: No clubbing, cyanosis or edema. DP/PT/Radials 2+ and equal bilaterally. RUE in sling.   Labs   Recent Labs   03/02/2015 1926  CKTOTAL 168  CKMB 4.3  TROPONINI 0.04*   Lab Results  Component Value Date   WBC 7.2 02/01/2015   HGB 12.2* 02/01/2015   HCT 39.1 02/01/2015   MCV 85.2 02/01/2015   PLT 167 02/01/2015   No results for input(s): NA, K, CL, CO2, BUN, CREATININE, CALCIUM, PROT, BILITOT, ALKPHOS, ALT, AST, GLUCOSE in the last 168 hours.  Invalid input(s): LABALBU Lab Results  Component Value Date   CHOL 124 07/06/2014   HDL 42.90 07/06/2014   LDLCALC 47 07/06/2014   TRIG 169.0* 07/06/2014   No results found for: DDIMER  Radiology/Studies  Dg  Chest 2 View  01/16/2015   CLINICAL DATA:  History of community acquired pneumonia, no chest complaints currently ; history of coronary artery disease treated with stent placement  EXAM: CHEST  2 VIEW  COMPARISON:  PA and lateral chest of November 22, 2011  FINDINGS: The lungs are adequately inflated. The interstitial markings are less prominent today than on the earlier study. There is stable apical pleural thickening. There is no pleural effusion. The cardiac silhouette is top-normal in size. The pulmonary vascularity is normal. There is a prosthetic left shoulder joint. There is moderate to severe degenerative change of the right shoulder. There is stable dextrocurvature of the lumbar spine.  IMPRESSION: COPD. There is no evidence of pneumonia nor pulmonary edema nor other acute cardiopulmonary disease.   Electronically Signed   By: David  [place] M.D.   On: 01/16/2015 10:27   Dg Chest Port 1 View  Mar 05, 2015   CLINICAL DATA:  Respiratory distress  EXAM: PORTABLE CHEST - 1 VIEW  COMPARISON:  03/05/2015  FINDINGS: The heart size is enlarged. There is aortic atherosclerosis noted. Small to moderate bilateral pleural effusions are identified left-greater-than-right. There is mild interstitial edema. Bibasilar atelectasis present.  IMPRESSION: 1. CHF. 2. Aortic atherosclerosis. 3. Bibasilar atelectasis.   Electronically Signed   By: Signa Kell M.D.   On:  03/05/15 19:07   Dg Shoulder Right Port  March 05, 2015   CLINICAL DATA:  Post shoulder replacement.  EXAM: PORTABLE RIGHT SHOULDER - 2+ VIEW  COMPARISON:  None.  FINDINGS: Changes of right shoulder replacement. No hardware or bony complicating feature. Degenerative changes at the right [facility] joint with probable resorption of the distal clavicle versus prior resection.  IMPRESSION: Right shoulder replacement.  No complicating feature.   Electronically Signed   By: Charlett Nose M.D.   On: 03/05/15 11:55   TTE 02/21/14 Study Conclusions  - Left ventricle: The cavity size was normal. There was mild concentric hypertrophy. Systolic function was normal. The estimated ejection fraction was in the range of 60% to 65%. Wall motion was normal; there were no regional wall motion abnormalities. Doppler parameters are consistent with abnormal left ventricular relaxation (grade 1 diastolic dysfunction). There was no evidence of elevated ventricular filling pressure by Doppler parameters. - Aortic valve: Trileaflet; normal thickness leaflets. There was no regurgitation. - Mitral valve: Calcified annulus. There was mild regurgitation. - Left atrium: The atrium was normal in size. - Right ventricle: Systolic function was normal. - Right atrium: The atrium was normal in size. - Tricuspid valve: Transvalvular velocity was within the normal range. There was no regurgitation. - Pericardium, extracardiac: There was no pericardial effusion.  ECG  03-05-2015 NSR. NSSTTWC.  Two PVCs. Unchanged compared to 01/06/15  ASSESSMENT AND PLAN  51M with CAD s/p LAD PCI (2011), RA on prednisone and etanercept, hypothyrodism on synthroid, CKD III, history of vascular congestion on CXR without frank history of CHF, normal EF 02/21/14, who has SOB s/p total right shoulder arthroplasty. The respiratory failure is most consistent with CHF due to intraoperative volume administration in the setting of what sounds like  previously subclinical HFpEF. He is warm and well perfused on exam. Since he was off his ASA/plavix pre-op and his Tn is borderline elevated, will be prudent to cycle troponins. His symptoms are not consistent with what he felt prior to 2011 PCI and ECG is stable.   1. Repeat IV lasix (20mg ) at midnight if we cannot wean from NRB 2. Cycle troponins 3. Start low dose metoprolol  given history of CAD and pulm edema in setting of hypertension 4. TTE in AM 5. Stop IVF 6. Continue ASA, statin, plavix, imdur  Signed, Glori Luis, MD 01/18/2015, 9:08 PM

## 2015-02-09 NOTE — Transfer of Care (Signed)
Immediate Anesthesia Transfer of Care Note  Patient: [name] [name]  Procedure(s) Performed: Procedure(s): RIGHT REVERSE SHOULDER TOTAL ARTHROPLASTY (Right)  Patient Location: PACU  Anesthesia Type:GA combined with regional for post-op pain  Level of Consciousness: awake, alert  and oriented  Airway & Oxygen Therapy: Patient Spontanous Breathing and Patient connected to nasal cannula oxygen  Post-op Assessment: Report given to RN and Post -op Vital signs reviewed and stable  Post vital signs: Reviewed and stable  Last Vitals:  Filed Vitals:   02/06/2015 0541  BP: 176/83  Pulse: 56  Temp: 36.1 C  Resp: 18    Complications: No apparent anesthesia complications

## 2015-02-09 NOTE — Brief Op Note (Signed)
01/29/2015  10:01 AM  PATIENT:  [name] [name]  80 y.o. male  PRE-OPERATIVE DIAGNOSIS:  RIGHT SHOULDER OA/ROTATOR CUFF INSUFFICIENCY  POST-OPERATIVE DIAGNOSIS:  RIGHT SHOULDER OA/ROTATOR CUFF INSUFFICIENCY  PROCEDURE:  Procedure(s): RIGHT REVERSE SHOULDER TOTAL ARTHROPLASTY (Right) DePuy Delta Xtend  SURGEON:  Surgeon(s) and Role:    * Beverely Low, MD - Primary  PHYSICIAN ASSISTANT:   ASSISTANTS: Thea Gist, PA-C   ANESTHESIA:   regional and general  EBL:     BLOOD ADMINISTERED:none  DRAINS: none   LOCAL MEDICATIONS USED:  MARCAINE     SPECIMEN:  No Specimen  DISPOSITION OF SPECIMEN:  N/A  COUNTS:  YES  TOURNIQUET:  * No tourniquets in log *  DICTATION: .Other Dictation: Dictation Number [id number]  PLAN OF CARE: Admit to inpatient   PATIENT DISPOSITION:  PACU - hemodynamically stable.   Delay start of Pharmacological VTE agent (>24hrs) due to surgical blood loss or risk of bleeding: no

## 2015-02-09 NOTE — Anesthesia Procedure Notes (Addendum)
Anesthesia Regional Block:  Interscalene brachial plexus block  Pre-Anesthetic Checklist: ,, timeout performed, Correct Patient, Correct Site, Correct Laterality, Correct Procedure, Correct Position, site marked, Risks and benefits discussed,  Surgical consent,  Pre-op evaluation,  At surgeon's request and post-op pain management  Laterality: Right  Prep: chloraprep       Needles:  Injection technique: Single-shot  Needle Type: Echogenic Stimulator Needle     Needle Length: 9cm 9 cm Needle Gauge: 21 and 21 G    Additional Needles:  Procedures: ultrasound guided (picture in chart) and nerve stimulator Interscalene brachial plexus block  Nerve Stimulator or Paresthesia:  Response: deltoid and triceps, 0.5 mA,   Additional Responses:   Narrative:  Start time: 01/21/2015 7:05 AM End time: 01/17/2015 7:15 AM Injection made incrementally with aspirations every 5 mL.  Performed by: Personally  Anesthesiologist: Suzette Battiest  Additional Notes: Risks and benefits discussed. Pt tolerated well with no immediate complications.   Procedure Name: Intubation Date/Time: 01/18/2015 7:46 AM Performed by: Maryland Pink Pre-anesthesia Checklist: Patient identified, Emergency Drugs available, Suction available, Timeout performed and Patient being monitored Patient Re-evaluated:Patient Re-evaluated prior to inductionOxygen Delivery Method: Circle system utilized Preoxygenation: Pre-oxygenation with 100% oxygen Intubation Type: IV induction Ventilation: Mask ventilation without difficulty Laryngoscope Size: Miller and 2 Grade View: Grade I Tube type: Oral Tube size: 7.5 mm Number of attempts: 1 Airway Equipment and Method: Stylet and LTA kit utilized Placement Confirmation: ETT inserted through vocal cords under direct vision,  positive ETCO2 and breath sounds checked- equal and bilateral Secured at: 20 cm Tube secured with: Tape Dental Injury: Teeth and Oropharynx as per  pre-operative assessment

## 2015-02-09 NOTE — Discharge Instructions (Signed)
Please keep the wound covered and clean and dry for one week, then ok to get wet in the shower.  No pushing, pulling, or lifting more than 2 pounds with the right arm.  Ok to remove the sling in the house.  Use the sling when you are out of the home for comfort.  Do not push out of a chair with the right arm.  Follow up with Dr Ranell Patrick or Nida Boatman in two weeks in the office  [phone]

## 2015-02-09 NOTE — Interval H&P Note (Signed)
History and Physical Interval Note:  01/18/2015 7:33 AM  [name] [name]  has presented today for surgery, with the diagnosis of RIGHT SHOULDER OA/ROTATOR CUFF INSUFFICIENCY  The various methods of treatment have been discussed with the patient and family. After consideration of risks, benefits and other options for treatment, the patient has consented to  Procedure(s): RIGHT REVERSE SHOULDER TOTAL ARTHROPLASTY (Right) as a surgical intervention .  The patient's history has been reviewed, patient examined, no change in status, stable for surgery.  I have reviewed the patient's chart and labs.  Questions were answered to the patient's satisfaction.     Tonda Wiederhold,STEVEN R

## 2015-02-09 NOTE — Care Management (Signed)
Utilization review completed by Philander Ake N. Corrado Hymon, RN BSN 

## 2015-02-10 ENCOUNTER — Inpatient Hospital Stay (HOSPITAL_COMMUNITY): Payer: Medicare Other

## 2015-02-10 DIAGNOSIS — R0602 Shortness of breath: Secondary | ICD-10-CM

## 2015-02-10 DIAGNOSIS — N183 Chronic kidney disease, stage 3 (moderate): Secondary | ICD-10-CM

## 2015-02-10 DIAGNOSIS — I5031 Acute diastolic (congestive) heart failure: Secondary | ICD-10-CM

## 2015-02-10 DIAGNOSIS — R1310 Dysphagia, unspecified: Secondary | ICD-10-CM

## 2015-02-10 DIAGNOSIS — T451X5A Adverse effect of antineoplastic and immunosuppressive drugs, initial encounter: Secondary | ICD-10-CM

## 2015-02-10 LAB — CBC WITH DIFFERENTIAL/PLATELET
BASOS PCT: 0 % (ref 0–1)
Basophils Absolute: 0 10*3/uL (ref 0.0–0.1)
EOS ABS: 0.2 10*3/uL (ref 0.0–0.7)
Eosinophils Relative: 2 % (ref 0–5)
HEMATOCRIT: 37.9 % — AB (ref 39.0–52.0)
HEMOGLOBIN: 12.2 g/dL — AB (ref 13.0–17.0)
Lymphocytes Relative: 7 % — ABNORMAL LOW (ref 12–46)
Lymphs Abs: 0.7 10*3/uL (ref 0.7–4.0)
MCH: 27.2 pg (ref 26.0–34.0)
MCHC: 32.2 g/dL (ref 30.0–36.0)
MCV: 84.6 fL (ref 78.0–100.0)
MONO ABS: 2.2 10*3/uL — AB (ref 0.1–1.0)
Monocytes Relative: 20 % — ABNORMAL HIGH (ref 3–12)
NEUTROS ABS: 7.5 10*3/uL (ref 1.7–7.7)
NEUTROS PCT: 71 % (ref 43–77)
Platelets: 111 10*3/uL — ABNORMAL LOW (ref 150–400)
RBC: 4.48 MIL/uL (ref 4.22–5.81)
RDW: 18.9 % — ABNORMAL HIGH (ref 11.5–15.5)
WBC: 10.6 10*3/uL — ABNORMAL HIGH (ref 4.0–10.5)

## 2015-02-10 LAB — BASIC METABOLIC PANEL
ANION GAP: 12 (ref 5–15)
Anion gap: 11 (ref 5–15)
BUN: 28 mg/dL — ABNORMAL HIGH (ref 6–20)
BUN: 35 mg/dL — AB (ref 6–20)
CHLORIDE: 104 mmol/L (ref 101–111)
CO2: 22 mmol/L (ref 22–32)
CO2: 22 mmol/L (ref 22–32)
Calcium: 7.4 mg/dL — ABNORMAL LOW (ref 8.9–10.3)
Calcium: 7.9 mg/dL — ABNORMAL LOW (ref 8.9–10.3)
Chloride: 98 mmol/L — ABNORMAL LOW (ref 101–111)
Creatinine, Ser: 2.27 mg/dL — ABNORMAL HIGH (ref 0.61–1.24)
Creatinine, Ser: 2.35 mg/dL — ABNORMAL HIGH (ref 0.61–1.24)
GFR calc Af Amer: 30 mL/min — ABNORMAL LOW (ref >60)
GFR calc Af Amer: 29 mL/min — ABNORMAL LOW (ref >60)
GFR calc non Af Amer: 26 mL/min — ABNORMAL LOW (ref >60)
GFR, EST NON AFRICAN AMERICAN: 25 mL/min — AB (ref >60)
GLUCOSE: 87 mg/dL (ref 65–99)
Glucose, Bld: 113 mg/dL — ABNORMAL HIGH (ref 65–99)
POTASSIUM: 4.1 mmol/L (ref 3.5–5.1)
Potassium: 3.6 mmol/L (ref 3.5–5.1)
SODIUM: 137 mmol/L (ref 135–145)
Sodium: 132 mmol/L — ABNORMAL LOW (ref 135–145)

## 2015-02-10 LAB — HEMOGLOBIN AND HEMATOCRIT, BLOOD
HCT: 35.3 % — ABNORMAL LOW (ref 39.0–52.0)
HEMOGLOBIN: 11.1 g/dL — AB (ref 13.0–17.0)

## 2015-02-10 LAB — BLOOD GAS, ARTERIAL
Acid-base deficit: 0.4 mmol/L (ref 0.0–2.0)
Bicarbonate: 23.1 mEq/L (ref 20.0–24.0)
Drawn by: 252031
FIO2: 1 %
O2 SAT: 94.7 %
Patient temperature: 102
TCO2: 24.2 mmol/L (ref 0–100)
pCO2 arterial: 37.1 mmHg (ref 35.0–45.0)
pH, Arterial: 7.421 (ref 7.350–7.450)
pO2, Arterial: 74.3 mmHg — ABNORMAL LOW (ref 80.0–100.0)

## 2015-02-10 LAB — TROPONIN I
TROPONIN I: 0.12 ng/mL — AB (ref <0.031)
Troponin I: 0.08 ng/mL — ABNORMAL HIGH (ref <0.031)
Troponin I: 0.11 ng/mL — ABNORMAL HIGH (ref <0.031)
Troponin I: 0.08 ng/mL — ABNORMAL HIGH (ref <0.031)
Troponin I: 0.08 ng/mL — ABNORMAL HIGH (ref <0.031)

## 2015-02-10 LAB — LACTIC ACID, PLASMA: LACTIC ACID, VENOUS: 1.5 mmol/L (ref 0.5–2.0)

## 2015-02-10 MED ORDER — ATORVASTATIN CALCIUM 80 MG PO TABS
80.0000 mg | ORAL_TABLET | Freq: Every day | ORAL | Status: DC
  Administered 2015-02-10 – 2015-02-14 (×5): 80 mg via ORAL
  Filled 2015-02-10 (×8): qty 1

## 2015-02-10 MED ORDER — PIPERACILLIN-TAZOBACTAM 3.375 G IVPB
3.3750 g | Freq: Three times a day (TID) | INTRAVENOUS | Status: DC
  Administered 2015-02-11 – 2015-02-16 (×15): 3.375 g via INTRAVENOUS
  Filled 2015-02-10 (×25): qty 50

## 2015-02-10 MED ORDER — VANCOMYCIN HCL IN DEXTROSE 750-5 MG/150ML-% IV SOLN
750.0000 mg | INTRAVENOUS | Status: DC
  Administered 2015-02-11 – 2015-02-15 (×4): 750 mg via INTRAVENOUS
  Filled 2015-02-10 (×6): qty 150

## 2015-02-10 MED ORDER — NOREPINEPHRINE BITARTRATE 1 MG/ML IV SOLN
0.0000 ug/min | INTRAVENOUS | Status: DC
  Filled 2015-02-10: qty 4

## 2015-02-10 MED ORDER — VANCOMYCIN HCL 10 G IV SOLR
1250.0000 mg | Freq: Once | INTRAVENOUS | Status: AC
  Administered 2015-02-10: 1250 mg via INTRAVENOUS
  Filled 2015-02-10: qty 1250

## 2015-02-10 MED ORDER — FUROSEMIDE 10 MG/ML IJ SOLN
20.0000 mg | INTRAMUSCULAR | Status: AC
Start: 2015-02-10 — End: 2015-02-10
  Administered 2015-02-10: 20 mg via INTRAVENOUS
  Filled 2015-02-10: qty 2

## 2015-02-10 MED ORDER — PIPERACILLIN-TAZOBACTAM 3.375 G IVPB 30 MIN
3.3750 g | Freq: Once | INTRAVENOUS | Status: AC
  Administered 2015-02-10: 3.375 g via INTRAVENOUS
  Filled 2015-02-10: qty 50

## 2015-02-10 NOTE — Plan of Care (Signed)
Problem: Consults Goal: Diagnosis - Shoulder Surgery Reverse Total Shoulder Arthroplasty     

## 2015-02-10 NOTE — Progress Notes (Signed)
Patient ID: Churchill Grimsley, male   DOB: Feb 28, 1935, 80 y.o.   MRN: [id number]     Subjective:    SOB overnight, improving this AM. No chest pain.   Objective:   Temp:  [97.6 F (36.4 C)-100.7 F (38.2 C)] 100.7 F (38.2 C) (06/25 0404) Pulse Rate:  [62-101] 97 (06/25 0404) Resp:  [14-45] 24 (06/25 0404) BP: (91-159)/(43-136) 91/43 mmHg (06/25 0404) SpO2:  [78 %-100 %] 90 % (06/25 0404)    Filed Weights   02/14/2015 0541  Weight: 137 lb (62.143 kg)    Intake/Output Summary (Last 24 hours) at 02/10/15 0810 Last data filed at 02/10/15 0408  Gross per 24 hour  Intake   1240 ml  Output   1475 ml  Net   -235 ml     Exam:  General: NAD  Resp: CTAB  Cardiac: RRR, no m/r/g, no JVD  GI: abdomen soft, NT, ND  MSK:no LE edema  Neuro: no focal deficits   Lab Results:  Basic Metabolic Panel:  Recent Labs Lab 02/10/15 0715  NA 137  K 3.6  CL 104  CO2 22  GLUCOSE 87  BUN 28*  CREATININE 2.27*  CALCIUM 7.4*    Liver Function Tests: No results for input(s): AST, ALT, ALKPHOS, BILITOT, PROT, ALBUMIN in the last 168 hours.  CBC:  Recent Labs Lab 02/10/15 0715  HGB 11.1*  HCT 35.3*    Cardiac Enzymes:  Recent Labs Lab 02/07/2015 1926 02/10/15 0050  CKTOTAL 168  --   CKMB 4.3  --   TROPONINI 0.04* 0.08*    BNP: No results for input(s): PROBNP in the last 8760 hours.  Coagulation: No results for input(s): INR in the last 168 hours.  ECG:   Medications:   Scheduled Medications: . amLODipine  2.5 mg Oral Daily  . aspirin EC  81 mg Oral QHS  . clopidogrel  75 mg Oral QPC supper  . docusate sodium  100 mg Oral BID  . famotidine  20 mg Oral Daily  . isosorbide mononitrate  30 mg Oral QPC supper  . levothyroxine  100 mcg Oral QAC breakfast  . metoprolol tartrate  12.5 mg Oral BID  . multivitamin with minerals  1 tablet Oral Daily  . omega-3 acid ethyl esters  1 g Oral Q supper  . simvastatin  40 mg Oral QHS     Infusions: . sodium  chloride 50 mL/hr at 02/07/2015 1230     PRN Medications:  acetaminophen **OR** acetaminophen, guaiFENesin, HYDROcodone-acetaminophen, ipratropium-albuterol, menthol-cetylpyridinium **OR** phenol, metoCLOPramide **OR** metoCLOPramide (REGLAN) injection, morphine injection, nitroGLYCERIN, ondansetron **OR** ondansetron (ZOFRAN) IV, polyethylene glycol, traMADol     Assessment/Plan    1. Acute on chronic diastolic heart failure - echo 10/7104 LVEF 60-65%, grade I diastolic dysfunction - repeat echo is pending - SOB developed after ortho surgery. CXR with small to moderate bilateral pleural effusions, mild edema - reecived lasix 20mg  IV last night and early this AM. 1.4 liters of urine out. Had 1.2 liters in, net negative only 230 mL. IVF stopped yesterday.  - breathing improved this morning, due to AKI would hold off on any further lasix at this time.   2. Elevated troponin - history of CAD with prior intervention - very mild elevation in postop setting, combined with acute on chronic diastolic heart failure. EKG no acute ischemic changes, chronic lateral ST/T changes. - ASA and plavix restarted, had been off for surgery - at this time do not suspect ACS, likely demand ischemia postop  and in setting of acute CHF. Continue medical therapy with ASA, plavix, imdur, statin, metoprolol - Change simva to high dose statin atorva 80mg .  - continue to cycle troponins. F/u repeat echo   3. AKI - Cr bump from 1.6 to 2.27,      , M.D., F.A.C.C.

## 2015-02-10 NOTE — Care Management Note (Signed)
Case Management Note  Patient Details  Name: Naaman Curro MRN: [id number] Date of Birth: 1935/02/23  Subjective/Objective:                  RIGHT REVERSE SHOULDER TOTAL ARTHROPLASTY (Right)  Action/Plan: Discharge planning:  OT recommending SNF placement. CM call to SW Cassandra @ [phone] to notify of recommendations and left VM with information. CM available for additional discharge planning needs.   Expected Discharge Date:                  Expected Discharge Plan:  Skilled Nursing Facility  In-House Referral:  Clinical Social Work  Discharge planning Services  CM Consult  Post Acute Care Choice:    Choice offered to:     DME Arranged:    DME Agency:     HH Arranged:    HH Agency:     Status of Service:  In process, will continue to follow  Medicare Important Message Given:    Date Medicare IM Given:    Medicare IM give by:    Date Additional Medicare IM Given:    Additional Medicare Important Message give by:     If discussed at Long Length of Stay Meetings, dates discussed:    Additional Comments:  Darcel Smalling, RN 02/10/2015, 4:45 PM

## 2015-02-10 NOTE — Op Note (Signed)
[name] [name], [name] [name]              ACCOUNT NO.:  [id number]  MEDICAL RECORD NO.:  [id number]  LOCATION:  [id number]                        FACILITY:  [facility]  PHYSICIAN:  Almedia Balls. Ranell Patrick, M.D. DATE OF BIRTH:  11/20/1934  DATE OF PROCEDURE:  01/23/2015 DATE OF DISCHARGE:                              OPERATIVE REPORT   PREOPERATIVE DIAGNOSIS:  Right shoulder end-stage rotator cuff tear arthropathy.  POSTOPERATIVE DIAGNOSIS:  Right shoulder end-stage rotator cuff tear arthropathy.  PROCEDURE PERFORMED:  Right shoulder reverse total shoulder arthroplasty using DePuy Delta Xtend prosthesis.  ATTENDING SURGEON:  Almedia Balls. Ranell Patrick, M.D.  ASSISTANT:  Donnie Coffin. Dixon, PA-C who scrubbed the entire procedure and necessary for satisfactory completion of surgery.  ANESTHESIA:  General anesthesia was used plus interscalene block.  ESTIMATED BLOOD LOSS:  100 mL.  FLUID REPLACEMENT:  1200 mL of crystalloids.  INSTRUMENT COUNTS:  Correct.  COMPLICATIONS:  There were no complications.  ANTIBIOTICS:  Perioperative antibiotics were given.  INDICATIONS:  The patient is a 79 year old male with progressive right shoulder pain and dysfunction secondary to rotator cuff tear arthropathy.  The patient has had progressive pain despite conservative management, presents for operative management for his severe pain to improve pain and improve shoulder function.  We discussed the potential risks and benefits of reverse total shoulder arthroplasty and the patient did agree to surgery and informed consent was signed on the chart.  DESCRIPTION OF PROCEDURE:  After an adequate level of anesthesia was achieved, the patient was positioned in the modified beach-chair position.  Right shoulder correctly identified and sterilely prepped and draped in the usual manner.  Time-out was called.  We entered the shoulder using the standard deltopectoral incision starting at the coracoid process extending down the  anterior humerus, dissection down through subcutaneous tissues using Bovie.  We identified the cephalic vein, took it laterally at the deltoid, pectoralis taken medially. Conjoined tendon identified and retracted medially.  The subscapularis taken off subperiosteally off the lesser tuberosity and tagged for repair at the end.  This was thin and attenuated, but still felt to be salvageable.  The inferior capsule was released.  The shoulder was externally rotated and extended and delivered out of the wound.  Teres minor in the back was retained.  We went ahead and entered the proximal humerus using a 6 mm reamer, then reamed up to a size 12 and then did our head resection with the 12 mm intramedullary resection guide.  We went ahead and resected 10 degrees of retroversion with an oscillating saw.  We then removed excess osteophytes from the proximal humerus.  We then prepared the proximal metaphyseal portion with our reamer for the epi 1 right metaphysis.  We then did our trial with the 12 body and the epi 1 right metaphyseal component, set on 0 setting, but placed in 10 degrees of retroversion.  Impacted the stem into position.  We retracted the humerus posteriorly, did a 360-degree capsular removal, and glenoid labrum removal.  We went ahead and freed up the subscapularis that was freed off the underside of the coracoid and could balance well, protected the axillary nerve.  Went ahead and had good 360 exposure of the scapula.  We then went ahead and found our center point from [facility] preparation and drilled the pin in place.  We then reamed down to flush reaming for the Metaglene and then drilled our central peg hole.  We impacted the Metaglene in position and placed the 42 locked inferiorly with okay purchase and then a 36 into the base of the coracoid with excellent purchase and then an 18 posteriorly nonlocked with excellent purchase.  We locked our locking screws.  The  anterior screw hole could not be filled due to it being near the edge.  We then went ahead and placed our nitinol wire and placed a 38 standard glenosphere into position, impacting that in and screwing at-home.  We checked our axillary nerve to make sure it was free and clear.  We irrigated the wound thoroughly and then trialed with our +6 and felt that was adequate.  We then removed the trial components, thoroughly irrigated the canal.  We felt due to his rheumatoid arthritis and poor bone quality that we would do some supplemental cement down the canal, so we placed some cement by mixing on the bowl in the back table DePuy one cement and then finger packing that down the canal, we left the cement out of the proximal metaphyseal area so we could get a good HA and on growth with the bone growing to the implant.  We impacted the stem into position, again 10 degrees of retroversion allowed the cement to set and then again trialed with a +6 and felt that was appropriate, we placed the real 38+ 6 poly on the humeral side, impacted that in position and then reduced the shoulder.  We were happy we had negative sulcus, negative gapping with external rotation and nice conjoint. Axillary nerve was not under undue tension.  We then repaired the subscapularis directly to bone and then did a final irrigation and inspection of the shoulder and then repaired deltopectoral interval with 0-Vicryl suture followed by 2-0 Vicryl subcutaneous closure and staples for skin.  Sterile dressing applied.  The patient placed in a shoulder sling and taken to the recovery room.     Almedia Balls. Ranell Patrick, M.D.     SRN/MEDQ  D:  01/30/2015  T:  02/13/2015  Job:  [id number]

## 2015-02-10 NOTE — Progress Notes (Addendum)
    Subjective: 1 Day Post-Op Procedure(s) (LRB): RIGHT REVERSE SHOULDER TOTAL ARTHROPLASTY (Right) Patient reports pain as 2 on 0-10 scale.   Denies CP or SOB.  Voiding without difficulty. Positive flatus. Objective: Vital signs in last 24 hours: Temp:  [97.6 F (36.4 C)-100.7 F (38.2 C)] 100.7 F (38.2 C) (06/25 0404) Pulse Rate:  [62-101] 97 (06/25 0404) Resp:  [14-45] 24 (06/25 0404) BP: (91-159)/(43-136) 91/43 mmHg (06/25 0404) SpO2:  [78 %-100 %] 90 % (06/25 0404)  Intake/Output from previous day: 06/24 0701 - 06/25 0700 In: 1240 [P.O.:240; I.V.:1000] Out: 1475 [Urine:1325; Blood:150] Intake/Output this shift:    Labs:  Recent Labs  02/10/15 0715  HGB 11.1*    Recent Labs  02/10/15 0715  HCT 35.3*    Recent Labs  02/10/15 0715  NA 137  K 3.6  CL 104  CO2 22  BUN 28*  CREATININE 2.27*  GLUCOSE 87  CALCIUM 7.4*   No results for input(s): LABPT, INR in the last 72 hours.  Physical Exam: Neurologically intact Intact pulses distally Incision: dressing C/D/I Compartment soft  Assessment/Plan: 1 Day Post-Op Procedure(s) (LRB): RIGHT REVERSE SHOULDER TOTAL ARTHROPLASTY (Right) Advance diet Up with therapy  Cardiology work-up in progress.   Venita Lick D for Dr. Venita Lick [facility] [phone] 02/10/2015, 8:57 AM

## 2015-02-10 NOTE — Progress Notes (Signed)
CM spoke to SW Cassandra who is aware of SW consult for SNF placement.

## 2015-02-10 NOTE — Evaluation (Signed)
Occupational Therapy Evaluation Patient Details Name: Cyree Chuong MRN: [id number] DOB: 10/01/1934 Today's Date: 02/10/2015    History of Present Illness Right reverse TSA. PMHx: severe RA   Clinical Impression   This 80 yo male admitted and underwent above presents to acute OT with decreased balance, decreased mobility, increased pain, decreased use of RUE, medical issues since surgery (decreased O2 sats, increasing tropinins--spoke with cardiology PA on call and he gave the OK to work and get pt up) all affecting his ability to care for himself at home. Pt will continue to benefit from acute OT with follow up OT at SNF.    Follow Up Recommendations  SNF    Equipment Recommendations   (TBD next venue)       Precautions / Restrictions Precautions Precautions: Fall;Shoulder Shoulder Interventions: Shoulder sling/immobilizer;Off for dressing/bathing/exercises Required Braces or Orthoses: Sling Restrictions Weight Bearing Restrictions: Yes RUE Weight Bearing: Non weight bearing      Mobility Bed Mobility Overal bed mobility: Needs Assistance Bed Mobility: Rolling;Sidelying to Sit Rolling: Mod assist Sidelying to sit: Max assist       General bed mobility comments: max VCs for sequencing  Transfers Overall transfer level: Needs assistance Equipment used: 1 person hand held assist Transfers: Sit to/from [employer] Transfers Sit to Stand: Mod assist Stand pivot transfers: Mod assist            Balance Overall balance assessment: Needs assistance Sitting-balance support: Single extremity supported;Feet supported Sitting balance-Leahy Scale: Poor     Standing balance support: Single extremity supported Standing balance-Leahy Scale: Poor                              ADL Overall ADL's : Needs assistance/impaired                                       General ADL Comments: total A at this time due to sx, lethargy, and  decreased use of hands pta     Vision Additional Comments: No change from baseline          Pertinent Vitals/Pain Pain Assessment: Faces Pain Score: 7  Faces Pain Scale: Hurts even more Pain Location: right shoulder Pain Descriptors / Indicators: Aching;Sore Pain Intervention(s): Limited activity within patient's tolerance;Repositioned;Monitored during session     Hand Dominance Right   Extremity/Trunk Assessment Upper Extremity Assessment Upper Extremity Assessment: RUE deficits/detail;LUE deficits/detail RUE Deficits / Details: shoulder sx this admission; as well as severe RA in hand RUE Coordination: decreased gross motor;decreased fine motor LUE Deficits / Details: severe RA in hand           Communication Communication Communication: HOH   Cognition Arousal/Alertness: Lethargic Behavior During Therapy: WFL for tasks assessed/performed Overall Cognitive Status: Impaired/Different from baseline Area of Impairment: Problem solving             Problem Solving: Slow processing;Decreased initiation;Requires verbal cues;Requires tactile cues        Exercises   Other Exercises Other Exercises: Pt performed supine 5 reps with me and 5 reps with wife of AAROM shoulder flexion (10 degrees), shoulder abduction (20 degrees), shoulder external rotation (10 degrees); elbow flexion/extension AAROM 10 reps   Shoulder Instructions Shoulder Instructions Correct positioning of sling/immobilizer: Caregiver independent with task Pendulum exercises (written home exercise program):  (NA) ROM for elbow, wrist and digits of operated UE: Caregiver  independent with task Dressing change:  (NA) Positioning of UE while sleeping: Caregiver independent with task    Home Living Family/patient expects to be discharged to:: Skilled nursing facility Living Arrangements: Spouse/significant other                                      Prior Functioning/Environment Level of  Independence: Needs assistance    ADL's / Homemaking Assistance Needed: Sometimes used A SPC Communication / Swallowing Assistance Needed: Wife A prn due to bad RA in hands      OT Diagnosis: Generalized weakness;Cognitive deficits;Acute pain   OT Problem List: Decreased strength;Decreased range of motion;Decreased activity tolerance;Impaired balance (sitting and/or standing);Pain;Impaired UE functional use;Decreased knowledge of use of DME or AE;Cardiopulmonary status limiting activity   OT Treatment/Interventions: Self-care/ADL training;Patient/family education;Balance training;DME and/or AE instruction;Therapeutic exercise;Therapeutic activities    OT Goals(Current goals can be found in the care plan section) Acute Rehab OT Goals Patient Stated Goal: wife--to get him to where I can manage him at home OT Goal Formulation: With patient/family Time For Goal Achievement: 02/23/2015 Potential to Achieve Goals: Good  OT Frequency: Min 3X/week              End of Session Equipment Utilized During Treatment:  (sling)  Activity Tolerance: Patient limited by lethargy;Patient limited by pain Patient left: in chair;with call bell/phone within reach;with family/visitor present   Time: 1022-1114 OT Time Calculation (min): 52 min Charges:  OT General Charges $OT Visit: 1 Procedure OT Evaluation $Initial OT Evaluation Tier I: 1 Procedure OT Treatments $Self Care/Home Management : 8-22 mins $Therapeutic Exercise: 8-22 mins  Evette Georges [phone] 02/10/2015, 1:04 PM

## 2015-02-10 NOTE — Progress Notes (Signed)
After cardiology consult, Dr. Zachery Conch gave verbal orders to gradually wean pt off the non rebreather mask and back onto a nasal cannula beginning at 6L. Pt tolerating 6L  and o2 sats ranging from 91%-97%, RR 22-27, 750 reached on pts incentive spirometer, otherwise VSS. Pt resting comfortably in bed. Nursing will continue to monitor.

## 2015-02-10 NOTE — Progress Notes (Signed)
ANTIBIOTIC CONSULT NOTE - INITIAL  Pharmacy Consult for vancomycin and zosyn Indication: pneumonia  Allergies  Allergen Reactions  . Aspirin Swelling    Only high doses of aspirin (given for arthritis).; "swelled face, lips, etc"  . Shellfish Allergy Anaphylaxis, Nausea And Vomiting and Swelling  . Lisinopril Cough  . Pantoprazole Other (See Comments)    dizzy    Patient Measurements: Weight: 137 lb (62.143 kg)   Vital Signs: Temp: 100.1 F (37.8 C) (06/25 2123) Temp Source: Oral (06/25 2123) BP: 98/53 mmHg (06/25 2123) Pulse Rate: 85 (06/25 2123) Intake/Output from previous day: 06/24 0701 - 06/25 0700 In: 1240 [P.O.:240; I.V.:1000] Out: 1475 [Urine:1325; Blood:150] Intake/Output from this shift:    Labs:  Recent Labs  02/10/15 0715  HGB 11.1*  CREATININE 2.27*   Estimated Creatinine Clearance: 23.6 mL/min (by C-G formula based on Cr of 2.27). No results for input(s): VANCOTROUGH, VANCOPEAK, VANCORANDOM, GENTTROUGH, GENTPEAK, GENTRANDOM, TOBRATROUGH, TOBRAPEAK, TOBRARND, AMIKACINPEAK, AMIKACINTROU, AMIKACIN in the last 72 hours.   Microbiology: Recent Results (from the past 720 hour(s))  Surgical pcr screen     Status: None   Collection Time: 02/01/15 10:45 AM  Result Value Ref Range Status   MRSA, PCR NEGATIVE NEGATIVE Final   Staphylococcus aureus NEGATIVE NEGATIVE Final    Comment:        The Xpert SA Assay (FDA approved for NASAL specimens in patients over 55 years of age), is one component of a comprehensive surveillance program.  Test performance has been validated by [facility] for patients greater than or equal to 56 year old. It is not intended to diagnose infection nor to guide or monitor treatment.     Medical History: Past Medical History  Diagnosis Date  . Rheumatoid arthritis(714.0)   . High cholesterol     takes Simvastatin daily  . Pneumonia     "has had walking pneumonia twice"  . Exertional dyspnea 03/04/12    "last few  months"  . Chronic kidney disease 03/04/12    "running ~ 40%"  . Umbilical hernia   . Hypothyroidism     takes SYnthroid daily  . Hypertension     takes Amlodipine and Imdur daily  . Coronary artery disease     takes Plavix daily  . GERD (gastroesophageal reflux disease)     takes Zantac daily    Medications:  Prescriptions prior to admission  Medication Sig Dispense Refill Last Dose  . acetaminophen (TYLENOL) 650 MG CR tablet Take 650 mg by mouth 2 (two) times daily as needed for pain.   Past Week at Unknown time  . amLODipine (NORVASC) 2.5 MG tablet Take 1 tablet (2.5 mg total) by mouth daily. (Patient taking differently: Take 2.5 mg by mouth daily after supper. ) 90 tablet 3 02/08/2015 at Unknown time  . aspirin EC 81 MG tablet Take 81 mg by mouth at bedtime.   Past Week at Unknown time  . clopidogrel (PLAVIX) 75 MG tablet Take 75 mg by mouth daily after supper.    Past Week at Unknown time  . Etanercept 25 MG/0.5ML SOSY Inject 25 mg into the skin 2 (two) times a week. Wednesday or Thursday and Saturday or Sunday (ENBREL)   Past Week at Unknown time  . fish oil-omega-3 fatty acids 1000 MG capsule Take 1 g by mouth daily with supper.    Past Week at Unknown time  . guaiFENesin (MUCINEX) 600 MG 12 hr tablet Take 1 tablet (600 mg total) by mouth 2 (two)  times daily. 20 tablet 0 Past Month at Unknown time  . Ipratropium-Albuterol (COMBIVENT RESPIMAT) 20-100 MCG/ACT AERS respimat Inhale 1 puff into the lungs every 6 (six) hours as needed for wheezing or shortness of breath. 1 Inhaler 0 Past Month at Unknown time  . isosorbide mononitrate (IMDUR) 30 MG 24 hr tablet Take 1 tablet (30 mg total) by mouth daily. (Patient taking differently: Take 30 mg by mouth daily after supper. ) 90 tablet 3 02/08/2015 at 1800  . levothyroxine (SYNTHROID, LEVOTHROID) 100 MCG tablet Take 100 mcg by mouth daily before breakfast.   01/17/2015 at Unknown time  . Multiple Vitamin (MULITIVITAMIN WITH MINERALS) TABS Take  1 tablet by mouth daily.   Past Week at Unknown time  . nitroGLYCERIN (NITROSTAT) 0.4 MG SL tablet Place 1 tablet (0.4 mg total) under the tongue every 5 (five) minutes x 3 doses as needed. For chest pain. (Patient taking differently: Place 0.4 mg under the tongue every 5 (five) minutes x 3 doses as needed for chest pain. ) 25 tablet 5 never  . predniSONE (DELTASONE) 5 MG tablet Take 1 tablet (5 mg total) by mouth daily with breakfast. To be started after tapering dose achieved.     . ranitidine (ZANTAC) 150 MG tablet Take 150 mg by mouth daily.    01/22/2015 at Unknown time  . simvastatin (ZOCOR) 40 MG tablet Take 40 mg by mouth at bedtime.    02/08/2015 at Unknown time  . levofloxacin (LEVAQUIN) 750 MG tablet Take 1 tablet (750 mg total) by mouth every other day. (Patient not taking: Reported on 02/01/2015) 4 tablet 0 Not Taking at Unknown time  . predniSONE (DELTASONE) 20 MG tablet Take 2 tablet by mouth daily X 2 days; then 1 tablet by mouth daily X 3 days; then 1/2 tablet by mouth daily X 3 days and then resume your prednisone 5mg  daily as yo were taking before. (Patient not taking: Reported on 02/01/2015) 10 tablet 0 Not Taking at Unknown time   Assessment: 80 yo man to start broad spectrum antibiotics.  His CrCl ~24 ml/min  Goal of Therapy:  Vancomycin trough level 15-20 mcg/ml  Plan:  Vancomycin 1250 mg IV X 1 then 750 mg IV q24 hours Zosyn 3.375 gm IV x 1 over 30 min then 3.375 gm IV q8 hours IE F/u renal function, cultures and clinical course  Donalyn Schneeberger [place]et 02/10/2015,9:52 PM

## 2015-02-10 NOTE — Significant Event (Addendum)
Rapid Response Event Note  Overview:  Called to see patient with onset of SOB Time Called: 1805 Arrival Time: 1815 Event Type: Respiratory  Initial Focused Assessment: On arrival patient supine in bed finishing nebulizer treatment.  Mild resp distress - RR 24-28 O2 sats 98% on NRB mask.  Bil BS with few exp wheezes left side some bibasilar fine crackles.  Able to speak full sentences without problems.  Face flushed - states he gets like this sometimes at home with his RA flare.  Denies chest pain.  States some mild pain right shoulder surgical site.  Abd soft. Regular pulse - 89.  BP 159/93.  RN Corrie Dandy states patient had sudden on set difficulty breathing with desaturations to the 70's.  No pedal edema - no JVD noted.     Interventions:  Stat call for PCXR, ABG, 12 lead EKG cardiac enzymes.  No changes noted on 12 lead.  Chart and labs reviewed.  Dr. Ranell Patrick on phone with [facility].  Patient with some shivering noted - he again states this is typical of his RA flare - he usually will cover with multiple blankets with relief at home - wife is present in room who confirms this report.  Temp 98.5.  HR 78 BP 154/89.  ABG, PCXR and 12 lead results reported to Dr. Ranell Patrick.  Attempt to wean patient to 50% FM = desats to 88% - patient denies SOB or CP - replaced on NRB mask without flaps - approx 65% - O2 sats 100%.  RR 24 143/71 HR 83 - SR with PVC's.  Lasix IV per order MD given by Jesse Sans.  Cardiology consult called per Dr.Norris.  Will follow as needed.  Handoff to [facility].   Event Summary: Name of Physician Notified: Dr. Ranell Patrick at  (pta RRT)    at    Outcome: Stayed in room and stabalized  Event End Time: 2000  [place]

## 2015-02-11 ENCOUNTER — Inpatient Hospital Stay (HOSPITAL_COMMUNITY): Payer: Medicare Other

## 2015-02-11 DIAGNOSIS — J982 Interstitial emphysema: Secondary | ICD-10-CM

## 2015-02-11 DIAGNOSIS — R1310 Dysphagia, unspecified: Secondary | ICD-10-CM

## 2015-02-11 DIAGNOSIS — J9601 Acute respiratory failure with hypoxia: Secondary | ICD-10-CM

## 2015-02-11 DIAGNOSIS — I10 Essential (primary) hypertension: Secondary | ICD-10-CM

## 2015-02-11 DIAGNOSIS — R6520 Severe sepsis without septic shock: Secondary | ICD-10-CM

## 2015-02-11 DIAGNOSIS — T451X5A Adverse effect of antineoplastic and immunosuppressive drugs, initial encounter: Secondary | ICD-10-CM | POA: Diagnosis present

## 2015-02-11 DIAGNOSIS — A419 Sepsis, unspecified organism: Secondary | ICD-10-CM | POA: Diagnosis present

## 2015-02-11 DIAGNOSIS — K7460 Unspecified cirrhosis of liver: Secondary | ICD-10-CM

## 2015-02-11 DIAGNOSIS — R0600 Dyspnea, unspecified: Secondary | ICD-10-CM

## 2015-02-11 DIAGNOSIS — J189 Pneumonia, unspecified organism: Secondary | ICD-10-CM | POA: Diagnosis present

## 2015-02-11 DIAGNOSIS — I5030 Unspecified diastolic (congestive) heart failure: Secondary | ICD-10-CM

## 2015-02-11 HISTORY — DX: Interstitial emphysema: J98.2

## 2015-02-11 HISTORY — DX: Unspecified cirrhosis of liver: K74.60

## 2015-02-11 HISTORY — DX: Unspecified diastolic (congestive) heart failure: I50.30

## 2015-02-11 LAB — CBC
HCT: 32.2 % — ABNORMAL LOW (ref 39.0–52.0)
Hemoglobin: 10.4 g/dL — ABNORMAL LOW (ref 13.0–17.0)
MCH: 27.2 pg (ref 26.0–34.0)
MCHC: 32.3 g/dL (ref 30.0–36.0)
MCV: 84.1 fL (ref 78.0–100.0)
Platelets: 100 10*3/uL — ABNORMAL LOW (ref 150–400)
RBC: 3.83 MIL/uL — AB (ref 4.22–5.81)
RDW: 18.6 % — ABNORMAL HIGH (ref 11.5–15.5)
WBC: 9.3 10*3/uL (ref 4.0–10.5)

## 2015-02-11 LAB — BASIC METABOLIC PANEL
Anion gap: 9 (ref 5–15)
BUN: 31 mg/dL — ABNORMAL HIGH (ref 6–20)
CHLORIDE: 102 mmol/L (ref 101–111)
CO2: 22 mmol/L (ref 22–32)
Calcium: 7.3 mg/dL — ABNORMAL LOW (ref 8.9–10.3)
Creatinine, Ser: 2.1 mg/dL — ABNORMAL HIGH (ref 0.61–1.24)
GFR calc Af Amer: 33 mL/min — ABNORMAL LOW (ref >60)
GFR calc non Af Amer: 29 mL/min — ABNORMAL LOW (ref >60)
GLUCOSE: 110 mg/dL — AB (ref 65–99)
POTASSIUM: 3.7 mmol/L (ref 3.5–5.1)
Sodium: 133 mmol/L — ABNORMAL LOW (ref 135–145)

## 2015-02-11 LAB — MAGNESIUM: MAGNESIUM: 1.6 mg/dL — AB (ref 1.7–2.4)

## 2015-02-11 LAB — GLUCOSE, CAPILLARY
GLUCOSE-CAPILLARY: 107 mg/dL — AB (ref 65–99)
GLUCOSE-CAPILLARY: 98 mg/dL (ref 65–99)

## 2015-02-11 LAB — PHOSPHORUS: PHOSPHORUS: 3.9 mg/dL (ref 2.5–4.6)

## 2015-02-11 MED ORDER — PNEUMOCOCCAL VAC POLYVALENT 25 MCG/0.5ML IJ INJ
0.5000 mL | INJECTION | INTRAMUSCULAR | Status: DC
  Filled 2015-02-11: qty 0.5

## 2015-02-11 MED ORDER — CETYLPYRIDINIUM CHLORIDE 0.05 % MT LIQD: 7.0000 mL | Freq: Two times a day (BID) | OROMUCOSAL | Status: DC

## 2015-02-11 MED ORDER — ACETAMINOPHEN 325 MG PO TABS
650.0000 mg | ORAL_TABLET | Freq: Four times a day (QID) | ORAL | Status: DC | PRN
  Administered 2015-02-11 – 2015-02-14 (×6): 650 mg via ORAL
  Filled 2015-02-11 (×6): qty 2

## 2015-02-11 MED ORDER — INSULIN ASPART 100 UNIT/ML ~~LOC~~ SOLN: 1.0000 [IU] | SUBCUTANEOUS | Status: DC

## 2015-02-11 MED ORDER — HYDROCORTISONE NA SUCCINATE PF 100 MG IJ SOLR
100.0000 mg | Freq: Four times a day (QID) | INTRAMUSCULAR | Status: DC
  Filled 2015-02-11 (×2): qty 2

## 2015-02-11 MED ORDER — SODIUM CHLORIDE 0.9 % IV BOLUS (SEPSIS)
2000.0000 mL | Freq: Once | INTRAVENOUS | Status: AC
Start: 2015-02-11 — End: 2015-02-11
  Administered 2015-02-11: 1000 mL via INTRAVENOUS

## 2015-02-11 NOTE — Consult Note (Signed)
[facility] Initial Consult Note   [name] [name]  XUX:[id number]  DOB: 1934-11-24  DOA: 2015/02/20 DOS: the patient was seen and examined on 02/10/2015  PCP: Georgann Housekeeper, MD   Referring physician: Dr Shon Baton Reason for consult: worsening respiratory failure, possible sepsis secondary to healthcare associated pneumonia   HPI: [name] [name] is a 80 y.o. male with Past medical history of rheumatoid arthritis on immunosuppressive therapy with chronic prednisone. Patient is coming from home. patient is postoperative day 1 of right reverse shoulder total arthroplasty , 20-Feb-2015.  postoperatively the patient was on 2 L of oxygen later on as the day progresses oxygenation requirement progressively worsened with increased work of breathing. He was on nonrebreather at which time chest x-ray was obtained and cardiology was consulted with a concern for pulmonary vascular congestion.  patient had mildly elevated troponin and has received 20 mg of IV Lasix.  His serum creatinine increased later on but he continues to remain on nonrebreather maintain adequate saturation. As his work of breathing was progressively worsening and he started developing fever as well with significantly poor oral intake as well as poor urine output and repeat chest x-ray was obtained which was showing evidence of pneumonia and therefore the patient was started on broad-spectrum antibiotics and was to be transferred to step down unit. at which time  Medicine hospital service was consulted for further management.  patient was recently hospitalized in May for community-acquired pneumonia with acute respiratory failure with sats dropping to 70s on room air and was discharged  Without oxygen. patient is on immunosuppressive therapy chronic prednisone use as well as etanercept for his rheumatoid arthritis,  Last time he used it was  Last week. patient also mentions that occasionally while eating he starts coughing at  home. Patient has poor oral intake and has not had anything to eat. Patient also has no bowel movement since he is here. No vomiting no nausea reported. patient also denies having any worsening shortness of breath or chest pain or abdominal pain. His only complaint is his shoulder.   Review of Systems: as mentioned in the history of present illness.  A Comprehensive review of the other systems is negative.  Past Medical History  Diagnosis Date  . Rheumatoid arthritis(714.0)   . High cholesterol     takes Simvastatin daily  . Pneumonia     "has had walking pneumonia twice"  . Exertional dyspnea 03/04/12    "last few months"  . Chronic kidney disease 03/04/12    "running ~ 40%"  . Umbilical hernia   . Hypothyroidism     takes SYnthroid daily  . Hypertension     takes Amlodipine and Imdur daily  . Coronary artery disease     takes Plavix daily  . GERD (gastroesophageal reflux disease)     takes Zantac daily   Past Surgical History  Procedure Laterality Date  . Coronary angioplasty with stent placement  2007; 2010    "2; 1"  . Coronary angioplasty with stent placement  03/04/12    "2; total of 5 now"  . Tonsillectomy  1935  . Appendectomy  1956  . Total knee arthroplasty  2000    bilaterally  . Total hip arthroplasty      bilaterally  . Revision total hip arthroplasty      right; "4 times; it jumped out and had to put it back in twice; redid it @ least once""  . Total shoulder arthroplasty      left  .  Foot surgery      "both; arthritis; cut ~ all the bones in my feet were crooked; couldn't walk"  . Cataract extraction w/ intraocular lens  implant, bilateral    . Left heart catheterization with coronary angiogram N/A 03/04/2012    Procedure: LEFT HEART CATHETERIZATION WITH CORONARY ANGIOGRAM;  Surgeon: Corky Crafts, MD;  Location: [facility] CATH LAB;  Service: Cardiovascular;  Laterality: N/A;  . Joint replacement     Social History:  reports that he quit smoking about  28 years ago. His smoking use included Cigarettes. He has a 76 pack-year smoking history. He has never used smokeless tobacco. He reports that he does not drink alcohol or use illicit drugs.  Allergies  Allergen Reactions  . Aspirin Swelling    Only high doses of aspirin (given for arthritis).; "swelled face, lips, etc"  . Shellfish Allergy Anaphylaxis, Nausea And Vomiting and Swelling  . Lisinopril Cough  . Pantoprazole Other (See Comments)    dizzy    Family History  Problem Relation Age of Onset  . Heart disease Father   . Heart disease Brother   . Heart disease Brother   . Arthritis Mother     Prior to Admission medications   Medication Sig Start Date End Date Taking? Authorizing Provider  acetaminophen (TYLENOL) 650 MG CR tablet Take 650 mg by mouth 2 (two) times daily as needed for pain.   Yes Historical Provider, MD  amLODipine (NORVASC) 2.5 MG tablet Take 1 tablet (2.5 mg total) by mouth daily. Patient taking differently: Take 2.5 mg by mouth daily after supper.  05/04/14  Yes Corky Crafts, MD  aspirin EC 81 MG tablet Take 81 mg by mouth at bedtime.   Yes Historical Provider, MD  clopidogrel (PLAVIX) 75 MG tablet Take 75 mg by mouth daily after supper.    Yes Historical Provider, MD  Etanercept 25 MG/0.5ML SOSY Inject 25 mg into the skin 2 (two) times a week. Wednesday or Thursday and Saturday or Sunday (ENBREL)   Yes Historical Provider, MD  fish oil-omega-3 fatty acids 1000 MG capsule Take 1 g by mouth daily with supper.    Yes Historical Provider, MD  guaiFENesin (MUCINEX) 600 MG 12 hr tablet Take 1 tablet (600 mg total) by mouth 2 (two) times daily. 01/08/15  Yes Vassie Loll, MD  Ipratropium-Albuterol (COMBIVENT RESPIMAT) 20-100 MCG/ACT AERS respimat Inhale 1 puff into the lungs every 6 (six) hours as needed for wheezing or shortness of breath. 01/08/15  Yes Vassie Loll, MD  isosorbide mononitrate (IMDUR) 30 MG 24 hr tablet Take 1 tablet (30 mg total) by mouth  daily. Patient taking differently: Take 30 mg by mouth daily after supper.  10/31/14  Yes Corky Crafts, MD  levothyroxine (SYNTHROID, LEVOTHROID) 100 MCG tablet Take 100 mcg by mouth daily before breakfast.   Yes Historical Provider, MD  Multiple Vitamin (MULITIVITAMIN WITH MINERALS) TABS Take 1 tablet by mouth daily.   Yes Historical Provider, MD  nitroGLYCERIN (NITROSTAT) 0.4 MG SL tablet Place 1 tablet (0.4 mg total) under the tongue every 5 (five) minutes x 3 doses as needed. For chest pain. Patient taking differently: Place 0.4 mg under the tongue every 5 (five) minutes x 3 doses as needed for chest pain.  01/31/14  Yes Corky Crafts, MD  predniSONE (DELTASONE) 5 MG tablet Take 1 tablet (5 mg total) by mouth daily with breakfast. To be started after tapering dose achieved. 01/08/15  Yes Vassie Loll, MD  ranitidine Orpha Bur)  150 MG tablet Take 150 mg by mouth daily.    Yes Historical Provider, MD  simvastatin (ZOCOR) 40 MG tablet Take 40 mg by mouth at bedtime.    Yes Historical Provider, MD  HYDROcodone-acetaminophen (NORCO) 5-325 MG per tablet Take 1 tablet by mouth every 6 (six) hours as needed for moderate pain or severe pain. 01/22/2015   Beverely Low, MD  levofloxacin (LEVAQUIN) 750 MG tablet Take 1 tablet (750 mg total) by mouth every other day. Patient not taking: Reported on 02/01/2015 01/08/15   Vassie Loll, MD  predniSONE (DELTASONE) 20 MG tablet Take 2 tablet by mouth daily X 2 days; then 1 tablet by mouth daily X 3 days; then 1/2 tablet by mouth daily X 3 days and then resume your prednisone 5mg  daily as yo were taking before. Patient not taking: Reported on 02/01/2015 01/08/15   01/10/15, MD  traMADol (ULTRAM) 50 MG tablet Take 1-2 tablets (50-100 mg total) by mouth every 6 (six) hours as needed for moderate pain. 01/26/2015   02/11/15, MD    Physical Exam: Filed Vitals:   02/10/15 2123 02/10/15 2151 02/10/15 2344 02/11/15 0029  BP: 98/53 113/52 96/48 84/46   Pulse:  85 77 83 76  Temp: 100.1 F (37.8 C) 102.5 F (39.2 C) 98.6 F (37 C) 99.7 F (37.6 C)  TempSrc: Oral Rectal Oral Axillary  Resp: 24 18 20 20   Weight:      SpO2: 99% 100% 95% 95%    General: Alert, Awake and Oriented to Time, Place and Person. Appear in marked distress Eyes: PERRL ENT: Oral Mucosa  Dry mucous sticking on the soft palate. Neck: no JVD,   Cardiovascular: S1 and S2 Present, no Murmur, Peripheral Pulses Present Respiratory: Bilateral Air entry equal and Decreased,  bilateral basal Crackles,no wheezes Abdomen: Bowel Sound  absent, Soft and Non tender Skin: no Rash Extremities: no Pedal edema, no calf tenderness Neurologic: Grossly Unremarkable.  Labs:  CBC:  Recent Labs Lab 02/10/15 0715 02/10/15 2207  WBC  --  10.6*  NEUTROABS  --  7.5  HGB 11.1* 12.2*  HCT 35.3* 37.9*  MCV  --  84.6  PLT  --  111*   Basic Metabolic Panel:  Recent Labs Lab 02/10/15 0715 02/10/15 2207  NA 137 132*  K 3.6 4.1  CL 104 98*  CO2 22 22  GLUCOSE 87 113*  BUN 28* 35*  CREATININE 2.27* 2.35*  CALCIUM 7.4* 7.9*   Liver Function Tests: No results for input(s): AST, ALT, ALKPHOS, BILITOT, PROT, ALBUMIN in the last 168 hours. No results for input(s): LIPASE, AMYLASE in the last 168 hours. No results for input(s): AMMONIA in the last 168 hours.  Cardiac Enzymes:  Recent Labs Lab 01/22/2015 1926 02/10/15 0050 02/10/15 0730 02/10/15 0911 02/10/15 1455 02/10/15 2119  CKTOTAL 168  --   --   --   --   --   CKMB 4.3  --   --   --   --   --   TROPONINI 0.04* 0.08* 0.11* 0.12* 0.08* 0.08*    BNP (last 3 results) No results for input(s): PROBNP in the last 8760 hours. CBG: No results for input(s): GLUCAP in the last 168 hours.  Radiological Exams: Dg Chest Port 1 View  01/23/2015   CLINICAL DATA:  Respiratory distress  EXAM: PORTABLE CHEST - 1 VIEW  COMPARISON:  02/04/2015  FINDINGS: The heart size is enlarged. There is aortic atherosclerosis noted. Small to  moderate bilateral pleural effusions  are identified left-greater-than-right. There is mild interstitial edema. Bibasilar atelectasis present.  IMPRESSION: 1. CHF. 2. Aortic atherosclerosis. 3. Bibasilar atelectasis.   Electronically Signed   By: Signa Kell M.D.   On: 02/07/2015 19:07   Dg Shoulder Right Port  02/08/2015   CLINICAL DATA:  Post shoulder replacement.  EXAM: PORTABLE RIGHT SHOULDER - 2+ VIEW  COMPARISON:  None.  FINDINGS: Changes of right shoulder replacement. No hardware or bony complicating feature. Degenerative changes at the right [facility] joint with probable resorption of the distal clavicle versus prior resection.  IMPRESSION: Right shoulder replacement.  No complicating feature.   Electronically Signed   By: Charlett Nose M.D.   On: 02/07/2015 11:55    EKG: Independently reviewed. normal sinus rhythm, PAC's noted.  Assessment/Plan Principal Problem:   S/P shoulder replacement Active Problems:   Coronary artery disease   Chronic kidney disease   Rheumatoid arthritis   SOB (shortness of breath)   Acute diastolic heart failure   Severe sepsis   Acute respiratory failure with hypoxemia   HCAP (healthcare-associated pneumonia)   Dysphagia   Effect of immunosuppressant therapy    1. Healthcare associated pneumonia, acute hypoxic respiratory failure, sepsis.  with elevated troponin , acute on chronic kidney injury, with being on immunosuppressive medications.  With this the patient has evidence of multiorgan dysfunction syndrome, he does not have leukocytosis or lactic acidosis but has hypotension. with this the patient is meeting criteria for severe sepsis and  Currently I feel therefore he would be better served with intensive care unit. Patient work of breathing at present at least require high flow nasal cannula instead of nonrebreather. I have discussed case with Dr. Craige Cotta who will be following up with the patient. continue broad-spectrum antibiotics. Patient remains  nothing by mouth. I have discontinued his blood pressure medication and placed Stress dose steroids.  further management for critical care.  2.  Status post right shoulder arthroplasty. Management per  Orthopedics.  3.  Possible acute on chronic diastolic dysfunction. elevated troponin  management per cardiology.  Thank you very much for involving [place] in care of your patient.  We will sign off at present, please call us again as needed. Primary team  Ms. Porfirio Mylar was informed about the plan to move the patient to ICU  Author: Lynden Oxford, MD [facility] Pager: [phone] 02/10/2015  If 7PM-7AM, please contact night-coverage [email or web address] Password [email or web address]

## 2015-02-11 NOTE — Progress Notes (Signed)
Patient ID: [name] [name], male   DOB: June 07, 1935, 80 y.o.   MRN: [id number]    Primary cardiologist:  Subjective:    Increased SOB  Objective:   Temp:  [97.5 F (36.4 C)-102.5 F (39.2 C)] 98.7 F (37.1 C) (06/26 0357) Pulse Rate:  [6-98] 75 (06/26 1000) Resp:  [15-29] 17 (06/26 1000) BP: (84-139)/(38-88) 108/46 mmHg (06/26 1000) SpO2:  [91 %-100 %] 97 % (06/26 1000) FiO2 (%):  [90 %-100 %] 90 % (06/26 1000) Weight:  [135 lb 12.9 oz (61.6 kg)] 135 lb 12.9 oz (61.6 kg) (06/26 0141) Last BM Date:  (PTA)  Filed Weights   01/25/2015 0541 02/11/15 0141  Weight: 137 lb (62.143 kg) 135 lb 12.9 oz (61.6 kg)    Intake/Output Summary (Last 24 hours) at 02/11/15 1052 Last data filed at 02/11/15 1000  Gross per 24 hour  Intake   1650 ml  Output    945 ml  Net    705 ml    Telemetry: SR and sinus tach  Exam:  General: NAD  Resp: clear anteriorally  Cardiac: regular, tachycardia, no m/r/g,  GI: abdomen soft, NT, ND  MSK: no LE edema  Neuro: no focal deficits   Lab Results:  Basic Metabolic Panel:  Recent Labs Lab 02/10/15 0715 02/10/15 2207 02/11/15 0528  NA 137 132* 133*  K 3.6 4.1 3.7  CL 104 98* 102  CO2 22 22 22   GLUCOSE 87 113* 110*  BUN 28* 35* 31*  CREATININE 2.27* 2.35* 2.10*  CALCIUM 7.4* 7.9* 7.3*  MG  --   --  1.6*    Liver Function Tests: No results for input(s): AST, ALT, ALKPHOS, BILITOT, PROT, ALBUMIN in the last 168 hours.  CBC:  Recent Labs Lab 02/10/15 0715 02/10/15 2207 02/11/15 0528  WBC  --  10.6* 9.3  HGB 11.1* 12.2* 10.4*  HCT 35.3* 37.9* 32.2*  MCV  --  84.6 84.1  PLT  --  111* 100*    Cardiac Enzymes:  Recent Labs Lab 01/20/2015 1926  02/10/15 0911 02/10/15 1455 02/10/15 2119  CKTOTAL 168  --   --   --   --   CKMB 4.3  --   --   --   --   TROPONINI 0.04*  < > 0.12* 0.08* 0.08*  < > = values in this interval not displayed.  BNP: No results for input(s): PROBNP in the last 8760 hours.  Coagulation: No  results for input(s): INR in the last 168 hours.  ECG:   Medications:   Scheduled Medications: . aspirin EC  81 mg Oral QHS  . atorvastatin  80 mg Oral q1800  . clopidogrel  75 mg Oral QPC supper  . docusate sodium  100 mg Oral BID  . famotidine  20 mg Oral Daily  . insulin aspart  1-3 Units Subcutaneous 6 times per day  . levothyroxine  100 mcg Oral QAC breakfast  . multivitamin with minerals  1 tablet Oral Daily  . omega-3 acid ethyl esters  1 g Oral Q supper  . piperacillin-tazobactam (ZOSYN)  IV  3.375 g Intravenous Q8H  . [START ON 02/12/2015] pneumococcal 23 valent vaccine  0.5 mL Intramuscular Tomorrow-1000  . vancomycin  750 mg Intravenous Q24H     Infusions:     PRN Medications:  HYDROcodone-acetaminophen, ipratropium-albuterol, nitroGLYCERIN, polyethylene glycol     Assessment/Plan    1. Acute on chronic diastolic heart failure - echo 02/14/2015 LVEF 60-65%, grade I diastolic dysfunction - repeat  echo is pending - SOB developed after ortho surgery. CXR with small to moderate bilateral pleural effusions, mild edema - he was diuresed after this and SOB improved. AKI yesterday, further diuretics held. Cr trending down this AM.  - also appears found to have pneumonia that contributed to symptoms, abx per primary team.  - appears euvolemic, hold on diurtetics today.  2. Elevated troponin - history of CAD with prior intervention - very mild elevation in postop setting, combined with acute on chronic diastolic heart failure. EKG no acute ischemic changes, chronic lateral ST/T changes. - ASA and plavix restarted, had been off for surgery - at this time do not suspect ACS, likely demand ischemia postop and in setting of acute CHF and pneumonia. Continue medical therapy with ASA, plavix, imdur, statin, metoprolol - f/u repeat echo  3. AKI - Cr trending back down. ,   4. Pneumonia - abx per primary team.   F/u LE Dopplers ordered by primary team. Will evaluate echo  for any signs of RV strain as PE remains on the differential as well.   Dina Rich, M.D.

## 2015-02-11 NOTE — Progress Notes (Signed)
Occupational Therapy Treatment Patient Details Name: Yazeed Pryer MRN: [id number] DOB: 01/04/1935 Today's Date: 02/11/2015    History of present illness Right reverse TSA; 6/25 pt moved to ICU due respiratory issues due to PNA. PMHx: severe RA   OT comments  This 42 pt making progress slowly with LUE movements due to medical issues since surgery. We will continue to see while he is here.  Follow Up Recommendations  SNF          Precautions / Restrictions   NWB'ing RUE Active Protocol--see orders.                                   Exercises Other Exercises Other Exercises: Pt with increased time due to pt "guarding" of movements tolerated 10 reps supine of AAROM elbow flexion/extension, shoulder flexion (10 degrees); shoulder abduction (20 degrees) and shoulder external rotation (20 degrees). Pt's wife came in at end of session and she agrees to do one more session of exercises today before she leaves                    Frequency Min 3X/week     Progress Toward Goals  OT Goals(current goals can now be found in the care plan section)  Progress towards OT goals: Progressing toward goals (slowly)     Plan Discharge plan remains appropriate       End of Session     Activity Tolerance Patient limited by lethargy;Patient limited by pain   Patient Left in bed           Time: 3818-2993 OT Time Calculation (min): 14 min  Charges: OT General Charges $OT Visit: 1 Procedure OT Treatments $Therapeutic Exercise: 8-22 mins  Evette Georges [phone] 02/11/2015, 4:30 PM

## 2015-02-11 NOTE — Progress Notes (Addendum)
Patient sats continue to drop into the low 80"s and now 78 on 5 L [place].  I call Tim in respiratory at 8:45 to consult with him regarding the patient.  He agreed that I could put him on the NRB mask and could increase this from 5 L to 15 L in order stabilize the patient's O2 sats.  He came to see him and patient is now stable on 15 L.    I consulted with  Nehemiah Settle concerning his status at that time also.  I also contacted the Cardiology doctor, Dr. Wyvonnia Dusky and Dr. Ranell Patrick office.  Anette Riedel from Dr. Ranell Patrick office returned my call.  By 9:30 I had talked with Dr. Wyvonnia Dusky and Porfirio Mylar.  I explained to [facility] that Dr. Wyvonnia Dusky had requested him go to ICU or CCU.  Nehemiah Settle came and examined the patient.  The patient temperature had increased to 102.5 (rectal), BP 113/53.  I also explained to all parties that the patient has only had 225 UOP all day.  He does have a history of chronic kidney disease.  Dr. Wyvonnia Dusky ordered, lab work, and a chest x-ray.  Patient remained stable and was monitored awaiting results.  When it was felt that the patient would do fine in step down, Dr. Allena Katz was consulted by Orthopedics.  I talked with Dr. Allena Katz and we discussed orders that I received from Dr. Wyvonnia Dusky, he advised me to contact [facility] and let her and Dr. Wyvonnia Dusky talk.  I awaited their decision.  Dr. Evalyn Casco came to consult on the patient.  His vitals at this time were BP: 84/48, Map 55, T 99.7 Axillary, resp 20, SPO2 95% on 15L NRB mask.  CXR showed pneumonia.  Patient talked with Dr. Curt Bears and agreed to go to ICU.  Patient was then transferred to ICU, report was given to [facility].

## 2015-02-11 NOTE — Progress Notes (Signed)
Subjective: 2 Days Post-Op Procedure(s) (LRB): RIGHT REVERSE SHOULDER TOTAL ARTHROPLASTY (Right) Patient reports pain as 2 on 0-10 scale.Doing better than earlier this morning when he was having difficulty breathing. Case discussd with his nurse.Chest Xray reviewed and question of mild Pneumonia vs. Atelectasis.     Objective: Vital signs in last 24 hours: Temp:  [97.5 F (36.4 C)-102.5 F (39.2 C)] 98.7 F (37.1 C) (06/26 0357) Pulse Rate:  [6-94] 86 (06/26 0700) Resp:  [15-29] 15 (06/26 0700) BP: (84-139)/(38-88) 118/53 mmHg (06/26 0700) SpO2:  [91 %-100 %] 93 % (06/26 0700) FiO2 (%):  [90 %-100 %] 90 % (06/26 0613) Weight:  [61.6 kg (135 lb 12.9 oz)] 61.6 kg (135 lb 12.9 oz) (06/26 0141)  Intake/Output from previous day: 06/25 0701 - 06/26 0700 In: 1410 [P.O.:360; IV Piggyback:1050] Out: 695 [Urine:695] Intake/Output this shift:     Recent Labs  02/10/15 0715 02/10/15 2207 02/11/15 0528  HGB 11.1* 12.2* 10.4*    Recent Labs  02/10/15 2207 02/11/15 0528  WBC 10.6* 9.3  RBC 4.48 3.83*  HCT 37.9* 32.2*  PLT 111* 100*    Recent Labs  02/10/15 2207 02/11/15 0528  NA 132* 133*  K 4.1 3.7  CL 98* 102  CO2 22 22  BUN 35* 31*  CREATININE 2.35* 2.10*  GLUCOSE 113* 110*  CALCIUM 7.9* 7.3*   No results for input(s): LABPT, INR in the last 72 hours.  Breathing is much better.  Assessment/Plan: 2 Days Post-Op Procedure(s) (LRB): RIGHT REVERSE SHOULDER TOTAL ARTHROPLASTY (Right) Up with therapy Plan for discharge tomorrow is questionable at this time.  Kaely Hollan A 02/11/2015, 8:35 AM

## 2015-02-11 NOTE — Progress Notes (Signed)
PULMONARY / CRITICAL CARE MEDICINE   Name: [name] [name] MRN: [id number] DOB: Jun 27, 1935    ADMISSION DATE:  01/25/2015  CHIEF COMPLAINT:  Hypoxic Resp Failure  INITIAL PRESENTATION: 84 M with RA on enteracept and CAD who underwent a shoulder atheroplasty on 6/24. Post-operatively he was hypoxemic and cardiology was consulted. Early on 6/26 he developed worsening hypoxemia and PCCM was consulted.   STUDIES:  ABG on NRB 7.421/37/74.3  SIGNIFICANT EVENTS: Worsening Hypoxemia and Hypotension --> Transfer to ICU  HISTORY OF PRESENT ILLNESS:  Mr. [name] [name] is a 75 M with RA on enteracept and CAD who underwent a shoulder atheroplasty on 6/24. Post-operatively he was hypoxemic and cardiology was consulted. It did not improve with diuresis and he developed AKI on CKD. Early on 6/26 he developed worsening hypoxemia and PCCM was consulted. He is currently without complaint. He denies feeling feverish, chills, CP, SOB, or cough.   PAST MEDICAL HISTORY :   has a past medical history of Rheumatoid arthritis(714.0); High cholesterol; Pneumonia; Exertional dyspnea (03/04/12); Chronic kidney disease (03/04/12); Umbilical hernia; Hypothyroidism; Hypertension; Coronary artery disease; and GERD (gastroesophageal reflux disease).  has past surgical history that includes Coronary angioplasty with stent (2007; 2010); Coronary angioplasty with stent (03/04/12); Tonsillectomy (1935); Appendectomy (1956); Total knee arthroplasty (2000); Total hip arthroplasty; Revision total hip arthroplasty; Total shoulder arthroplasty; Foot surgery; Cataract extraction w/ intraocular lens  implant, bilateral; left heart catheterization with coronary angiogram (N/A, 03/04/2012); and Joint replacement. Prior to Admission medications   Medication Sig Start Date End Date Taking? Authorizing Provider  acetaminophen (TYLENOL) 650 MG CR tablet Take 650 mg by mouth 2 (two) times daily as needed for pain.   Yes Historical Provider, MD  amLODipine  (NORVASC) 2.5 MG tablet Take 1 tablet (2.5 mg total) by mouth daily. Patient taking differently: Take 2.5 mg by mouth daily after supper.  05/04/14  Yes Corky Crafts, MD  aspirin EC 81 MG tablet Take 81 mg by mouth at bedtime.   Yes Historical Provider, MD  clopidogrel (PLAVIX) 75 MG tablet Take 75 mg by mouth daily after supper.    Yes Historical Provider, MD  Etanercept 25 MG/0.5ML SOSY Inject 25 mg into the skin 2 (two) times a week. Wednesday or Thursday and Saturday or Sunday (ENBREL)   Yes Historical Provider, MD  fish oil-omega-3 fatty acids 1000 MG capsule Take 1 g by mouth daily with supper.    Yes Historical Provider, MD  guaiFENesin (MUCINEX) 600 MG 12 hr tablet Take 1 tablet (600 mg total) by mouth 2 (two) times daily. 01/08/15  Yes Vassie Loll, MD  Ipratropium-Albuterol (COMBIVENT RESPIMAT) 20-100 MCG/ACT AERS respimat Inhale 1 puff into the lungs every 6 (six) hours as needed for wheezing or shortness of breath. 01/08/15  Yes Vassie Loll, MD  isosorbide mononitrate (IMDUR) 30 MG 24 hr tablet Take 1 tablet (30 mg total) by mouth daily. Patient taking differently: Take 30 mg by mouth daily after supper.  10/31/14  Yes Corky Crafts, MD  levothyroxine (SYNTHROID, LEVOTHROID) 100 MCG tablet Take 100 mcg by mouth daily before breakfast.   Yes Historical Provider, MD  Multiple Vitamin (MULITIVITAMIN WITH MINERALS) TABS Take 1 tablet by mouth daily.   Yes Historical Provider, MD  nitroGLYCERIN (NITROSTAT) 0.4 MG SL tablet Place 1 tablet (0.4 mg total) under the tongue every 5 (five) minutes x 3 doses as needed. For chest pain. Patient taking differently: Place 0.4 mg under the tongue every 5 (five) minutes x 3 doses as needed for  chest pain.  01/31/14  Yes Corky Crafts, MD  predniSONE (DELTASONE) 5 MG tablet Take 1 tablet (5 mg total) by mouth daily with breakfast. To be started after tapering dose achieved. 01/08/15  Yes Vassie Loll, MD  ranitidine (ZANTAC) 150 MG tablet  Take 150 mg by mouth daily.    Yes Historical Provider, MD  simvastatin (ZOCOR) 40 MG tablet Take 40 mg by mouth at bedtime.    Yes Historical Provider, MD  HYDROcodone-acetaminophen (NORCO) 5-325 MG per tablet Take 1 tablet by mouth every 6 (six) hours as needed for moderate pain or severe pain. 02/07/2015   Beverely Low, MD  levofloxacin (LEVAQUIN) 750 MG tablet Take 1 tablet (750 mg total) by mouth every other day. Patient not taking: Reported on 02/01/2015 01/08/15   Vassie Loll, MD  predniSONE (DELTASONE) 20 MG tablet Take 2 tablet by mouth daily X 2 days; then 1 tablet by mouth daily X 3 days; then 1/2 tablet by mouth daily X 3 days and then resume your prednisone 5mg  daily as yo were taking before. Patient not taking: Reported on 02/01/2015 01/08/15   01/10/15, MD  traMADol (ULTRAM) 50 MG tablet Take 1-2 tablets (50-100 mg total) by mouth every 6 (six) hours as needed for moderate pain. 01/23/2015   02/11/15, MD   Allergies  Allergen Reactions  . Aspirin Swelling    Only high doses of aspirin (given for arthritis).; "swelled face, lips, etc"  . Shellfish Allergy Anaphylaxis, Nausea And Vomiting and Swelling  . Lisinopril Cough  . Pantoprazole Other (See Comments)    dizzy    FAMILY HISTORY:  indicated that his mother is deceased. He indicated that his father is deceased.  SOCIAL HISTORY:  reports that he quit smoking about 28 years ago. His smoking use included Cigarettes. He has a 76 pack-year smoking history. He has never used smokeless tobacco. He reports that he does not drink alcohol or use illicit drugs.  REVIEW OF SYSTEMS:  A 12-system ROS was conducted and, unless otherwise specified in the HPI, was negative.   SUBJECTIVE:   VITAL SIGNS: Temp:  [97.5 F (36.4 C)-102.5 F (39.2 C)] 100.1 F (37.8 C) (06/26 0141) Pulse Rate:  [6-97] 91 (06/26 0200) Resp:  [18-29] 23 (06/26 0200) BP: (84-129)/(38-66) 117/64 mmHg (06/26 0200) SpO2:  [90 %-100 %] 93 % (06/26  0200) Weight:  [135 lb 12.9 oz (61.6 kg)] 135 lb 12.9 oz (61.6 kg) (06/26 0141) HEMODYNAMICS:   VENTILATOR SETTINGS:   INTAKE / OUTPUT:  Intake/Output Summary (Last 24 hours) at 02/11/15 0212 Last data filed at 02/10/15 1846  Gross per 24 hour  Intake    360 ml  Output    495 ml  Net   -135 ml    PHYSICAL EXAMINATION: General:  Elderly M in NAD on NRB Neuro:  Intact HEENT:  Sclera anicteric, conjunctiva pink, MMM, OP Clear Cardiovascular:  RRR, NS1/S2, (-) MRG Lungs:  Rales at bases Abdomen:  S/NT/ND/(+)BS Musculoskeletal:  (-) C/C/E Skin:  Intact  LABS:  CBC  Recent Labs Lab 02/10/15 0715 02/10/15 2207  WBC  --  10.6*  HGB 11.1* 12.2*  HCT 35.3* 37.9*  PLT  --  111*   Coag's No results for input(s): APTT, INR in the last 168 hours. BMET  Recent Labs Lab 02/10/15 0715 02/10/15 2207  NA 137 132*  K 3.6 4.1  CL 104 98*  CO2 22 22  BUN 28* 35*  CREATININE 2.27* 2.35*  GLUCOSE 87  113*   Electrolytes  Recent Labs Lab 02/10/15 0715 02/10/15 2207  CALCIUM 7.4* 7.9*   Sepsis Markers  Recent Labs Lab 02/10/15 2207  LATICACIDVEN 1.5   ABG  Recent Labs Lab 02/02/2015 1835 02/10/15 2210  PHART 7.423 7.421  PCO2ART 32.2* 37.1  PO2ART 78.7* 74.3*   Liver Enzymes No results for input(s): AST, ALT, ALKPHOS, BILITOT, ALBUMIN in the last 168 hours. Cardiac Enzymes  Recent Labs Lab 02/10/15 0911 02/10/15 1455 02/10/15 2119  TROPONINI 0.12* 0.08* 0.08*   Glucose No results for input(s): GLUCAP in the last 168 hours.  Imaging Dg Chest Port 1 View  02/11/2015   CLINICAL DATA:  Acute onset of respiratory distress. Initial encounter.  EXAM: PORTABLE CHEST - 1 VIEW  COMPARISON:  Chest radiograph performed 02/01/2015  FINDINGS: Lung expansion is improved. Persistent bibasilar airspace opacities may reflect atelectasis or possibly mild pneumonia, depending on the patient's symptoms. No definite pleural effusion or pneumothorax is seen, though the  lung apices are partially obscured by the patient's head.  The cardiomediastinal silhouette is mildly enlarged. No acute osseous abnormalities are identified. Bilateral shoulder arthroplasties are grossly stable in appearance.  IMPRESSION: Lung expansion is improved. Persistent bibasilar airspace opacities may reflect atelectasis or possibly mild pneumonia, depending on the patient's symptoms. Mild cardiomegaly noted.   Electronically Signed   By: Roanna Raider M.D.   On: 02/11/2015 01:22     ASSESSMENT / PLAN:  PULMONARY A:  Acute hypoxic RF: Most likely 2/2 pneumonia. No evidence of fluid overload on exam currently. PE in DDx as well.  P:   Supplemental O2 to maintain sats ?>= 92% Aggressive HCAP coverage LE dopplers, consider PE protocol CT  CARDIOVASCULAR A:  Sepsis: Likely 2/2 HCAP Hypotension: Transient, presumably 2/2 sepsis. Improved following antibiotics. CAD Presumed HFpEF: Appears euvolemic on exam.  Elevated Troponin: P:  IVF Hold stress dose steroids Follow-up cards Cont OP CAD/HTN Regimen  RENAL A:   AKI on CKD: Presumably 2/2 sepsis Hyponatremia P:   IVF Monitor  GASTROINTESTINAL A:  No acute issues  HEMATOLOGIC A:  Anemia: Chronic. Likely AOCD. P:  Consider Fe Studies as OP  INFECTIOUS A:  HCAP P:   BCx2: NGTD UC: NGTD Sputum: NGTD Abx: Vanc/Zosyn, start date 6/26, day 1/8  ENDOCRINE A:   Elevated Blood Glucose   Hypthyroidism P:   SSI A1c Cont Levothyroxine  NEUROLOGIC A:  No acute issues  RHEUMATIC: A:  RA:  P: Monitor  FAMILY  - Updates:   - Inter-disciplinary family meet or Palliative Care meeting due by:  7/3     TODAY'S SUMMARY:   CRITICAL CARE: The patient is critically ill with multiple organ systems failure and requires high complexity decision making for assessment and support, frequent evaluation and titration of therapies, application of advanced monitoring technologies and extensive interpretation of multiple  databases. Critical Care Time devoted to patient care services described in this note is 45 minutes.  Evalyn Casco, MD Pulmonary and Critical Care Medicine [facility] Pager: [phone]  02/11/2015, 2:12 AM

## 2015-02-11 NOTE — Progress Notes (Signed)
Call received earlier this evening 2042 regarding Pt with increased 02 need, but no SOB. RN advised at that time to notify Pt primary MD and cardiology who was consulted last night for similar concerns. 2nd call received tonight at 2145 regarding Dr. Waynard Reeds new orders for Levophed gtt, ABG, CXR, and ABX. RN advised to not start Levo gtt as Pt BP at that time was above 90. RRT RN at bedside 2150 to assess Pt. Lung sounds with scattered rhonchi, diminished bases. RR 18, no SOB assessed, Po2 98-100% on NRB. Alert oriented, follows commands. BP 113/52, HR 70s. Rectal temp obtained yielding 102.5. Lactic acid lab order placed per RRT sepsis protocol. [facility] PA on call for Dr.Norris paged and updated on Pt status per floor RN. PA made aware of new lactic acid order placed and CBC Bmet also ordered. Pt does not appear to be toxic at this time however He would benefit from closer monitoring in SDU. Orders received per P.A for Dr. Shon Baton on call for Dr.Norris to transfer to SDU. Pt left resting in bed, BC and labs drawn. Floor RN to start ABX and transfer to SDU. Advised RN to monitor Pt closely and  notify my self and Provider if Pt worsens prior to transport.

## 2015-02-11 NOTE — Progress Notes (Signed)
  Echocardiogram 2D Echocardiogram has been performed.  [name] [name] 02/11/2015, 9:06 AM

## 2015-02-12 ENCOUNTER — Inpatient Hospital Stay (HOSPITAL_COMMUNITY): Payer: Medicare Other

## 2015-02-12 DIAGNOSIS — R0902 Hypoxemia: Secondary | ICD-10-CM

## 2015-02-12 DIAGNOSIS — J189 Pneumonia, unspecified organism: Secondary | ICD-10-CM

## 2015-02-12 DIAGNOSIS — J9601 Acute respiratory failure with hypoxia: Secondary | ICD-10-CM

## 2015-02-12 DIAGNOSIS — R6520 Severe sepsis without septic shock: Secondary | ICD-10-CM

## 2015-02-12 DIAGNOSIS — A419 Sepsis, unspecified organism: Secondary | ICD-10-CM

## 2015-02-12 DIAGNOSIS — Z96619 Presence of unspecified artificial shoulder joint: Secondary | ICD-10-CM

## 2015-02-12 LAB — BASIC METABOLIC PANEL
Anion gap: 10 (ref 5–15)
BUN: 24 mg/dL — ABNORMAL HIGH (ref 6–20)
CALCIUM: 7.4 mg/dL — AB (ref 8.9–10.3)
CO2: 22 mmol/L (ref 22–32)
CREATININE: 1.83 mg/dL — AB (ref 0.61–1.24)
Chloride: 101 mmol/L (ref 101–111)
GFR calc Af Amer: 39 mL/min — ABNORMAL LOW (ref >60)
GFR calc non Af Amer: 34 mL/min — ABNORMAL LOW (ref >60)
Glucose, Bld: 116 mg/dL — ABNORMAL HIGH (ref 65–99)
Potassium: 3.6 mmol/L (ref 3.5–5.1)
Sodium: 133 mmol/L — ABNORMAL LOW (ref 135–145)

## 2015-02-12 LAB — CBC
HCT: 31.9 % — ABNORMAL LOW (ref 39.0–52.0)
Hemoglobin: 10.3 g/dL — ABNORMAL LOW (ref 13.0–17.0)
MCH: 26.8 pg (ref 26.0–34.0)
MCHC: 32.3 g/dL (ref 30.0–36.0)
MCV: 82.9 fL (ref 78.0–100.0)
PLATELETS: 93 10*3/uL — AB (ref 150–400)
RBC: 3.85 MIL/uL — ABNORMAL LOW (ref 4.22–5.81)
RDW: 18.2 % — AB (ref 11.5–15.5)
WBC: 8.9 10*3/uL (ref 4.0–10.5)

## 2015-02-12 LAB — URINE CULTURE: CULTURE: NO GROWTH

## 2015-02-12 LAB — HEMOGLOBIN A1C
Hgb A1c MFr Bld: 5.7 % — ABNORMAL HIGH (ref 4.8–5.6)
Mean Plasma Glucose: 117 mg/dL

## 2015-02-12 MED ORDER — TECHNETIUM TC 99M DIETHYLENETRIAME-PENTAACETIC ACID: 40.0000 | Freq: Once | INTRAVENOUS | Status: AC | PRN

## 2015-02-12 MED ORDER — PANTOPRAZOLE SODIUM 40 MG PO TBEC
40.0000 mg | DELAYED_RELEASE_TABLET | Freq: Two times a day (BID) | ORAL | Status: DC
  Administered 2015-02-12 – 2015-02-14 (×4): 40 mg via ORAL
  Filled 2015-02-12 (×4): qty 1

## 2015-02-12 MED ORDER — MAGNESIUM SULFATE 2 GM/50ML IV SOLN
2.0000 g | Freq: Once | INTRAVENOUS | Status: AC
Start: 2015-02-12 — End: 2015-02-12
  Administered 2015-02-12: 2 g via INTRAVENOUS
  Filled 2015-02-12: qty 50

## 2015-02-12 MED ORDER — PREDNISONE 20 MG PO TABS
20.0000 mg | ORAL_TABLET | Freq: Every day | ORAL | Status: DC
  Administered 2015-02-13 – 2015-02-14 (×2): 20 mg via ORAL
  Filled 2015-02-12 (×4): qty 1

## 2015-02-12 MED ORDER — TECHNETIUM TO 99M ALBUMIN AGGREGATED: 6.0000 | Freq: Once | INTRAVENOUS | Status: AC | PRN

## 2015-02-12 NOTE — Care Management (Signed)
Important Message  Patient Details  Name: [name] [name] MRN: [id number] Date of Birth: 01/12/1935   Medicare Important Message Given:  Yes-second notification given    Kyla Balzarine 02/12/2015, 4:17 PM

## 2015-02-12 NOTE — Evaluation (Signed)
Clinical/Bedside Swallow Evaluation Patient Details  Name: [name] [name] MRN: [id number] Date of Birth: 11/29/1934  Today's Date: 02/12/2015 Time: SLP Start Time (ACUTE ONLY): 1430 SLP Stop Time (ACUTE ONLY): 1448 SLP Time Calculation (min) (ACUTE ONLY): 18 min  Past Medical History:  Past Medical History  Diagnosis Date  . Rheumatoid arthritis(714.0)   . High cholesterol     takes Simvastatin daily  . Pneumonia     "has had walking pneumonia twice"  . Exertional dyspnea 03/04/12    "last few months"  . Chronic kidney disease 03/04/12    "running ~ 40%"  . Umbilical hernia   . Hypothyroidism     takes SYnthroid daily  . Hypertension     takes Amlodipine and Imdur daily  . Coronary artery disease     takes Plavix daily  . GERD (gastroesophageal reflux disease)     takes Zantac daily   Past Surgical History:  Past Surgical History  Procedure Laterality Date  . Coronary angioplasty with stent placement  2007; 2010    "2; 1"  . Coronary angioplasty with stent placement  03/04/12    "2; total of 5 now"  . Tonsillectomy  1935  . Appendectomy  1956  . Total knee arthroplasty  2000    bilaterally  . Total hip arthroplasty      bilaterally  . Revision total hip arthroplasty      right; "4 times; it jumped out and had to put it back in twice; redid it @ least once""  . Total shoulder arthroplasty      left  . Foot surgery      "both; arthritis; cut ~ all the bones in my feet were crooked; couldn't walk"  . Cataract extraction w/ intraocular lens  implant, bilateral    . Left heart catheterization with coronary angiogram N/A 03/04/2012    Procedure: LEFT HEART CATHETERIZATION WITH CORONARY ANGIOGRAM;  Surgeon: Corky Crafts, MD;  Location[facility] CATH LAB;  Service: Cardiovascular;  Laterality: N/A;  . Joint replacement     HPI:  50 M with RA, PNA, GERD, and CAD who underwent a shoulder atheroplasty on 6/24. Post-operatively he was hypoxemic.  Early on 6/26 he developed  worsening hypoxemia, most likely secondary to PNA, aspiration is a concern.    Assessment / Plan / Recommendation Clinical Impression  Patient presents with a functional oropharyngeal swallow with seemingly intact airway protection. No overt indication of aspiration noted. Unable however to r/o aspiration related to reflux given h/o GER and patient c/o occassional globus, primarily when attempting to eat or drink in reclined position. Educated patient on general safe swallowing and reflux precautions as well as precautions to take while respiratory status compromized which may further impact aspiration risk. Patient verbalized understanding. No further SLP f/u indicated. If need to r/o silent aspiration in the future, recommend instrumental testing.     Aspiration Risk  Mild    Diet Recommendation Age appropriate regular solids;Thin   Medication Administration: Whole meds with liquid Compensations: Slow rate;Small sips/bites    Other  Recommendations Oral Care Recommendations: Oral care BID               Swallow Study    General Other Pertinent Information: 32 M with RA, PNA, GERD, and CAD who underwent a shoulder atheroplasty on 6/24. Post-operatively he was hypoxemic.  Early on 6/26 he developed worsening hypoxemia, most likely secondary to PNA, aspiration is a concern.  Type of Study: Bedside swallow evaluation Previous Swallow  Assessment: none Diet Prior to this Study: Regular;Thin liquids Temperature Spikes Noted: No Respiratory Status: Supplemental O2 delivered via (comment) (high flow nasal cannula) History of Recent Intubation: Yes Length of Intubations (days):  (surgery only) Date extubated: 01/28/2015 Behavior/Cognition: Alert;Cooperative;Pleasant mood Oral Cavity - Dentition: Adequate natural dentition/normal for age Self-Feeding Abilities: Able to feed self Patient Positioning: Upright in bed Baseline Vocal Quality: Normal Volitional Cough: Strong Volitional Swallow:  Able to elicit    Oral/Motor/Sensory Function Overall Oral Motor/Sensory Function: Appears within functional limits for tasks assessed   Ice Chips Ice chips: Not tested   Thin Liquid Thin Liquid: Within functional limits Presentation: Straw;Self Fed    Nectar Thick Nectar Thick Liquid: Not tested   Honey Thick Honey Thick Liquid: Not tested   Puree Puree: Within functional limits Presentation: Spoon   Solid   GO   [name] Schremp MA, CCC-SLP [phone]  Solid: Within functional limits Presentation: Self Fed       [name] [name] 02/12/2015,3:01 PM

## 2015-02-12 NOTE — Progress Notes (Signed)
o2 flow increased to 70 %. Temp 101.3 ax. Tylenol given. Examined by cards. 12 lead in progress. 128HR, 93% sat, RR 29

## 2015-02-12 NOTE — Progress Notes (Signed)
Subjective: Chills, fever.  He had some CP earlier which resolved.  He said it's from being intubated.   Objective: Vital signs in last 24 hours: Temp:  [97.4 F (36.3 C)-100.8 F (38.2 C)] 99.3 F (37.4 C) (06/27 0802) Pulse Rate:  [41-123] 115 (06/27 0826) Resp:  [13-31] 31 (06/27 0826) BP: (85-155)/(39-99) 142/99 mmHg (06/27 0800) SpO2:  [87 %-100 %] 92 % (06/27 0826) FiO2 (%):  [60 %-90 %] 60 % (06/27 0736) Weight:  [136 lb 11 oz (62 kg)] 136 lb 11 oz (62 kg) (06/27 0200) Last BM Date:  (PTA)  Intake/Output from previous day: 06/26 0701 - 06/27 0700 In: 1260 [P.O.:960; IV Piggyback:300] Out: 1300 [Urine:1300] Intake/Output this shift:    Medications Scheduled Meds: . aspirin EC  81 mg Oral QHS  . atorvastatin  80 mg Oral q1800  . clopidogrel  75 mg Oral QPC supper  . docusate sodium  100 mg Oral BID  . famotidine  20 mg Oral Daily  . levothyroxine  100 mcg Oral QAC breakfast  . multivitamin with minerals  1 tablet Oral Daily  . omega-3 acid ethyl esters  1 g Oral Q supper  . piperacillin-tazobactam (ZOSYN)  IV  3.375 g Intravenous Q8H  . pneumococcal 23 valent vaccine  0.5 mL Intramuscular Tomorrow-1000  . vancomycin  750 mg Intravenous Q24H   Continuous Infusions:  PRN Meds:.acetaminophen, HYDROcodone-acetaminophen, ipratropium-albuterol, nitroGLYCERIN, polyethylene glycol  PE: General appearance: alert, cooperative and Shivering.  Lungs: Crackles on the right Heart: irregularly irregular rhythm and Tachycardic.  No MM Extremities: No LEE Pulses: 2+ and symmetric Skin: Warm and dry Neurologic: Grossly normal  Lab Results:   Recent Labs  02/10/15 0715 02/10/15 2207 02/11/15 0528  WBC  --  10.6* 9.3  HGB 11.1* 12.2* 10.4*  HCT 35.3* 37.9* 32.2*  PLT  --  111* 100*   BMET  Recent Labs  02/10/15 0715 02/10/15 2207 02/11/15 0528  NA 137 132* 133*  K 3.6 4.1 3.7  CL 104 98* 102  CO2 22 22 22   GLUCOSE 87 113* 110*  BUN 28* 35* 31*    CREATININE 2.27* 2.35* 2.10*  CALCIUM 7.4* 7.9* 7.3*   Cardiac Panel (last 3 results)  Recent Labs  24-Feb-2015 1926  02/10/15 0911 02/10/15 1455 02/10/15 2119  CKTOTAL 168  --   --   --   --   CKMB 4.3  --   --   --   --   TROPONINI 0.04*  < > 0.12* 0.08* 0.08*  RELINDX 2.6*  --   --   --   --   < > = values in this interval not displayed.    Assessment/Plan  Principal Problem:   S/P shoulder replacement   Coronary artery disease   Acute on Chronic kidney disease  BMET ordered daily.     Elevated troponin   0.12 peak.  Flat trend.  likely from acute illness.  EF normal.  Back on ASA, plavix after surgery.  Tachy and irregular.  Rate 120-130's.  Looks like sinus tach with PAC/PVCs.  Will double check with EKG.  Confrimed.  No Ischemic changes.      Rheumatoid arthritis   Acute diastolic heart failure  EF 55-60%, G1DD.  SOB developed after ortho surgery. CXR with small to moderate bilateral pleural effusions, mild edema.  He was diuresed after this and SOB improved.  Acute on CKI over the weekend, further diuretics held.   BMET ordered for this morning.  He also appears to have pneumonia that contributed to symptoms, abx per primary team. He appears euvolemic.  Diuretics DCd.   Severe sepsis  Febrile.  Temp 101.  Rn giving tylenol.  On ABx   Acute respiratory failure with hypoxemia   HCAP (healthcare-associated pneumonia)  Per primary team.    Dysphagia   Effect of immunosuppressant therapy    LOS: 3 days    HAGER, BRYAN PA-C 02/12/2015 8:37 AM  Patient seen and examined and history reviewed. Agree with above findings and plan. Denies SOB. Has hiccups. Telemetry shows sinus tachy. Febrile to 101 degrees. Echo yesterday showed normal LV function. V/Q scan and LE venous doppler pending. Oxygenating well. Troponin elevation with flat curve consistent with demand ischemia from sepsis. Diuretics on hold now and appears euvolemic. CT of chest without contrast also ordered to  evaluate for ILD.   Violeta Lecount [place], MDFACC 02/12/2015 11:22 AM

## 2015-02-12 NOTE — Progress Notes (Signed)
*  Preliminary Results* Bilateral lower extremity venous duplex completed. Visualized bilateral lower extremity veins are negative for deep vein thrombosis. There is no evidence of Baker's cyst bilaterally.  02/12/2015  Gertie Fey, RVT, RDCS, RDMS

## 2015-02-12 NOTE — Care Management Note (Signed)
Case Management Note  Patient Details  Name: [name] [name] MRN: [id number] Date of Birth: February 09, 1935  Subjective/Objective:     Pt s/p Rt total shoulder arthoplasty on 02-23-2015 with post op respiratory failure, likely due to pneumonia.  PTA, pt resided at home with spouse.               Action/Plan: PT recommending SNF at dc for rehab; CSW consulted to facilitate possible dc to SNF when medically stable.  Will follow progress.    Expected Discharge Date:                  Expected Discharge Plan:  Skilled Nursing Facility  In-House Referral:  Clinical Social Work  Discharge planning Services  CM Consult  Post Acute Care Choice:    Choice offered to:     DME Arranged:    DME Agency:     HH Arranged:    HH Agency:     Status of Service:  In process, will continue to follow  Medicare Important Message Given:    Date Medicare IM Given:    Medicare IM give by:    Date Additional Medicare IM Given:    Additional Medicare Important Message give by:     If discussed at Long Length of Stay Meetings, dates discussed:    Additional Comments:  Quintella Baton, RN, BSN  Trauma/Neuro ICU Case Manager [phone]

## 2015-02-12 NOTE — Progress Notes (Signed)
   Subjective: 3 Days Post-Op Procedure(s) (LRB): RIGHT REVERSE SHOULDER TOTAL ARTHROPLASTY (Right)  Pt currently tachy at 110 and hypertensive at 135/110 Breathing is better per report of nursing staff and family at bedside Denies any symptoms with the right shoulder currently Sling in place Fever last night  Patient reports pain as mild.  Objective:   VITALS:   Filed Vitals:   02/12/15 0915  BP: 134/102  Pulse: 108  Temp:   Resp: 21    Right shoulder incision healing well nv intact distally Sling in place No rashes or edema Tachy and hypertensive currently with occasional PVCs  LABS  Recent Labs  02/10/15 0715 02/10/15 2207 02/11/15 0528  HGB 11.1* 12.2* 10.4*  HCT 35.3* 37.9* 32.2*  WBC  --  10.6* 9.3  PLT  --  111* 100*     Recent Labs  02/10/15 0715 02/10/15 2207 02/11/15 0528  NA 137 132* 133*  K 3.6 4.1 3.7  BUN 28* 35* 31*  CREATININE 2.27* 2.35* 2.10*  GLUCOSE 87 113* 110*     Assessment/Plan: 3 Days Post-Op Procedure(s) (LRB): RIGHT REVERSE SHOULDER TOTAL ARTHROPLASTY (Right) Continue IV antibiotics to treat pneumonia Gentle therapy to right shoulder but pt may refuse if needs more rest or is too tired Will defer to medical team for continued treatment  D/c planning for several days once pneumonia improves Will continue to monitor daily   Alphonsa Overall, MPAS, PA-C  02/12/2015, 9:25 AM

## 2015-02-12 NOTE — Progress Notes (Addendum)
Pt tachycardic, sats 89, tachypneic, RR 30s, shivering.RT, cards and CCM notified.

## 2015-02-12 NOTE — Progress Notes (Signed)
Occupational Therapy Treatment Patient Details Name: [name] [name] MRN: [id number] DOB: 12-13-1934 Today's Date: 02/12/2015    History of present illness This 80 y.o male admitted for reverse TSA.  Pt was transferred to ICU 02/10/15 due to sepsis secondary to PNA.   PMH: includes severe RA with chronic steroid use   OT comments  Pt seen bed level for AAROM Rt UE per protoccol.   Pt attempted to assist, but due to severity of RA, is unable to perform self ROM.  02 sats 83-92 on 20L, 60% FIO2 HF02.  Will continue to follow.   Follow Up Recommendations  SNF    Equipment Recommendations  None recommended by OT    Recommendations for Other Services      Precautions / Restrictions Precautions Precautions: Fall;Shoulder Type of Shoulder Precautions: AROM/PROM 90 FF; 60 abd; 30 ER Shoulder Interventions: Shoulder sling/immobilizer;Off for dressing/bathing/exercises Precaution Booklet Issued: Yes (comment) Precaution Comments: see limitations above  Required Braces or Orthoses: Sling Restrictions Weight Bearing Restrictions: Yes RUE Weight Bearing: Non weight bearing       Mobility Bed Mobility                  Transfers                      Balance                                   ADL                                                Vision                     Perception     Praxis      Cognition   Behavior During Therapy: WFL for tasks assessed/performed Overall Cognitive Status: Impaired/Different from baseline Area of Impairment: Attention;Safety/judgement;Problem solving   Current Attention Level: Sustained Memory: Decreased recall of precautions    Safety/Judgement: Decreased awareness of safety   Problem Solving: Slow processing;Requires verbal cues      Extremity/Trunk Assessment               Exercises Shoulder Exercises Shoulder Flexion: AAROM;Right;20 reps;Supine (85) Shoulder  ABduction: AAROM;Right;20 reps (60) Shoulder External Rotation: AAROM;Right;20 reps;Supine (30) Elbow Flexion: AAROM;Right;20 reps;Supine Elbow Extension: AAROM;Right;20 reps;Supine Wrist Flexion: PROM;Right;20 reps;Supine Wrist Extension: PROM;Right;20 reps;Supine Digit Composite Flexion: PROM;Right;20 reps;Supine Composite Extension: PROM;Right;15 reps;Supine Donning/doffing shirt without moving shoulder: Maximal assistance Correct positioning of sling/immobilizer: Maximal assistance Proper positioning of operated UE when showering: Maximal assistance   Shoulder Instructions Shoulder Instructions Donning/doffing shirt without moving shoulder: Maximal assistance Correct positioning of sling/immobilizer: Maximal assistance Proper positioning of operated UE when showering: Maximal assistance     General Comments      Pertinent Vitals/ Pain       Pain Assessment: No/denies pain  Home Living                                          Prior Functioning/Environment              Frequency Min 3X/week     Progress Toward Goals  OT Goals(current goals can now be found in the care plan section)  Progress towards OT goals: Progressing toward goals  ADL Goals Pt Will Transfer to Toilet: with min guard assist;ambulating Pt Will Perform Toileting - Clothing Manipulation and hygiene: with min assist;sit to/from stand Pt/caregiver will Perform Home Exercise Program: Increased ROM;Increased strength;Right Upper extremity;With written HEP provided Additional ADL Goal #1: Pt will be min guard A in and OOB for BADLs  Plan Discharge plan remains appropriate    Co-evaluation                 End of Session     Activity Tolerance Other (comment) (respiratory issues.  Performed AAROM only )   Patient Left in bed;with call bell/phone within reach   Nurse Communication          Time: 8016-5537 OT Time Calculation (min): 17 min  Charges: OT General  Charges $OT Visit: 1 Procedure OT Treatments $Therapeutic Exercise: 8-22 mins  Sharnese Heath M 02/12/2015, 4:38 PM

## 2015-02-12 NOTE — Progress Notes (Signed)
PULMONARY / CRITICAL CARE MEDICINE   Name: [name] [name] MRN: [id number] DOB: 13-Jun-1935    ADMISSION DATE:  01/21/2015  CHIEF COMPLAINT:  Hypoxic Resp Failure  INITIAL PRESENTATION: 29 M with RA on enteracept and CAD who underwent a shoulder atheroplasty on 6/24. Post-operatively he was hypoxemic and cardiology was consulted. Early on 6/26 he developed worsening hypoxemia and PCCM was consulted.   STUDIES:  ABG on NRB 7.421/37/74.3  SIGNIFICANT EVENTS: 6/26 transfer to ICU for hypotension and hypoxemia.  HISTORY OF PRESENT ILLNESS:  [name] [name] is a 5 M with RA on enteracept and CAD who underwent a shoulder atheroplasty on 6/24. Post-operatively he was hypoxemic and cardiology was consulted. It did not improve with diuresis and he developed AKI on CKD. Early on 6/26 he developed worsening hypoxemia and PCCM was consulted. He is currently without complaint. He denies feeling feverish, chills, CP, SOB, or cough.   SUBJECTIVE: no events overnight, remains on high flow O2  VITAL SIGNS: Temp:  [97.4 F (36.3 C)-101.4 F (38.6 C)] 101.4 F (38.6 C) (06/27 0826) Pulse Rate:  [41-128] 108 (06/27 0915) Resp:  [13-35] 21 (06/27 0915) BP: (85-155)/(34-104) 134/102 mmHg (06/27 0915) SpO2:  [87 %-100 %] 92 % (06/27 0915) FiO2 (%):  [60 %-90 %] 70 % (06/27 0900) Weight:  [62 kg (136 lb 11 oz)] 62 kg (136 lb 11 oz) (06/27 0200) HEMODYNAMICS:   VENTILATOR SETTINGS: Vent Mode:  [-]  FiO2 (%):  [60 %-90 %] 70 % INTAKE / OUTPUT:  Intake/Output Summary (Last 24 hours) at 02/12/15 1041 Last data filed at 02/12/15 0540  Gross per 24 hour  Intake    900 ml  Output   1050 ml  Net   -150 ml   PHYSICAL EXAMINATION: General:  Elderly M in NAD on NRB Neuro:  Intact HEENT:  Sclera anicteric, conjunctiva pink, MMM, OP Clear Cardiovascular:  RRR, NS1/S2, (-) MRG Lungs:  Rales at bases Abdomen:  S/NT/ND/(+)BS Musculoskeletal:  (-) C/C/E Skin:  Intact  LABS:  CBC  Recent Labs Lab  02/10/15 2207 02/11/15 0528 02/12/15 0955  WBC 10.6* 9.3 8.9  HGB 12.2* 10.4* 10.3*  HCT 37.9* 32.2* 31.9*  PLT 111* 100* 93*   Coag's No results for input(s): APTT, INR in the last 168 hours. BMET  Recent Labs Lab 02/10/15 0715 02/10/15 2207 02/11/15 0528  NA 137 132* 133*  K 3.6 4.1 3.7  CL 104 98* 102  CO2 22 22 22   BUN 28* 35* 31*  CREATININE 2.27* 2.35* 2.10*  GLUCOSE 87 113* 110*   Electrolytes  Recent Labs Lab 02/10/15 0715 02/10/15 2207 02/11/15 0528  CALCIUM 7.4* 7.9* 7.3*  MG  --   --  1.6*  PHOS  --   --  3.9   Sepsis Markers  Recent Labs Lab 02/10/15 2207  LATICACIDVEN 1.5   ABG  Recent Labs Lab 01/21/2015 1835 02/10/15 2210  PHART 7.423 7.421  PCO2ART 32.2* 37.1  PO2ART 78.7* 74.3*   Liver Enzymes No results for input(s): AST, ALT, ALKPHOS, BILITOT, ALBUMIN in the last 168 hours. Cardiac Enzymes  Recent Labs Lab 02/10/15 0911 02/10/15 1455 02/10/15 2119  TROPONINI 0.12* 0.08* 0.08*   Glucose  Recent Labs Lab 02/11/15 0335 02/11/15 0812  GLUCAP 107* 98   Imaging No results found.  I reviewed CXR myself, concern for ILD.  ASSESSMENT / PLAN:  PULMONARY A:  Acute hypoxic RF: Most likely 2/2 pneumonia but concern is for unrecognized ILD with severe RA. No evidence of  fluid overload on exam currently. PE in DDx as well.  Another concern is aspiration. P:   Supplemental O2 to maintain sats ?>= 92% Aggressive HCAP coverage LE dopplers V/Q scan to r/o PE Chest CT without contrast ordered for ?ILD with high resolution cuts Swallow evaluation ordered Doubt PPI and d/c H2 blocker. Restart prednisone at 20 mg then taper down to 5 mg PO daily (patient's home dose).  CARDIOVASCULAR A:  Sepsis: Likely 2/2 HCAP Hypotension: Transient, presumably 2/2 sepsis. Improved following antibiotics. CAD Presumed HFpEF: Appears euvolemic on exam.  Elevated Troponin: Echo with EF of 55-60% with grade one diastolic dysfunction. P:  KVO  IVF Steroids as above Follow-up cards Cont OP CAD/HTN Regimen  RENAL A:   AKI on CKD: Presumably 2/2 sepsis Hyponatremia P:   IVF Monitor  GASTROINTESTINAL A:  Concern for aspiration (patient reports significant history). P:   - Speech therapy to evaluate. - Double up on PPI  HEMATOLOGIC A:  Anemia: Chronic. Likely AOCD. P:  Consider Fe Studies as OP  INFECTIOUS A:  HCAP P:   BCx2: NGTD UC: NGTD Sputum: NGTD Abx: Vanc/Zosyn, start date 6/26, day 2/8  ENDOCRINE A:   Elevated Blood Glucose   Hypthyroidism P:   SSI A1c Cont Levothyroxine  NEUROLOGIC A:  No acute issues  RHEUMATIC: A:  RA:  P: Monitor  FAMILY  - Updates: patient updated bedside  - Inter-disciplinary family meet or Palliative Care meeting due by:  7/3   TODAY'S SUMMARY: Continue HFNC for now, attempt to titrate, studies above ordered, hole in ICU given need for high flow.  The patient is critically ill with multiple organ systems failure and requires high complexity decision making for assessment and support, frequent evaluation and titration of therapies, application of advanced monitoring technologies and extensive interpretation of multiple databases.   Critical Care Time devoted to patient care services described in this note is  35  Minutes. This time reflects time of care of this signee Dr Koren Bound. This critical care time does not reflect procedure time, or teaching time or supervisory time of PA/NP/Med student/Med Resident etc but could involve care discussion time.  Alyson Reedy, M.D. [facility] Pulmonary/Critical Care Medicine. Pager: [phone]. After hours pager: [phone].  02/12/2015, 10:41 AM

## 2015-02-12 NOTE — Progress Notes (Signed)
Orthopedics Progress Note  Subjective: Patient more alert this evening per the family.  Mr Carne reports no shoulder pain. Objective:  Filed Vitals:   02/12/15 1537  BP:   Pulse:   Temp: 98 F (36.7 C)  Resp:     General: Awake and alert  Musculoskeletal: Right UE with pain free PROM of the shoulder, NVI distally Neurovascularly intact  Lab Results  Component Value Date   WBC 8.9 02/12/2015   HGB 10.3* 02/12/2015   HCT 31.9* 02/12/2015   MCV 82.9 02/12/2015   PLT 93* 02/12/2015       Component Value Date/Time   NA 133* 02/12/2015 0955   K 3.6 02/12/2015 0955   CL 101 02/12/2015 0955   CO2 22 02/12/2015 0955   GLUCOSE 116* 02/12/2015 0955   BUN 24* 02/12/2015 0955   CREATININE 1.83* 02/12/2015 0955   CALCIUM 7.4* 02/12/2015 0955   GFRNONAA 34* 02/12/2015 0955   GFRAA 39* 02/12/2015 0955    Lab Results  Component Value Date   INR 2.3* 08/28/2007   INR 1.1 08/27/2007   INR 1.0 08/25/2007    Assessment/Plan: POD #3 s/p Procedure(s): RIGHT REVERSE SHOULDER TOTAL ARTHROPLASTY Patient remains in ICU for care of pneumonia and hypoxia and tachycardia - appreciate critical care managing. OT will continue with bedside therapy. Mobilization as tolerated.  DVT prophylaxis mechanical and Plavix Seems to be improving clinically at this point  Almedia Balls. Ranell Patrick, MD 02/12/2015 5:12 PM

## 2015-02-12 NOTE — Progress Notes (Signed)
HR 115, 129/34, 95%. RR 21. Pt appears more comfortable.

## 2015-02-13 ENCOUNTER — Encounter (HOSPITAL_COMMUNITY): Payer: Self-pay | Admitting: Orthopedic Surgery

## 2015-02-13 ENCOUNTER — Inpatient Hospital Stay (HOSPITAL_COMMUNITY): Payer: Medicare Other

## 2015-02-13 DIAGNOSIS — I2510 Atherosclerotic heart disease of native coronary artery without angina pectoris: Secondary | ICD-10-CM

## 2015-02-13 DIAGNOSIS — Z9861 Coronary angioplasty status: Secondary | ICD-10-CM

## 2015-02-13 DIAGNOSIS — N189 Chronic kidney disease, unspecified: Secondary | ICD-10-CM

## 2015-02-13 DIAGNOSIS — I248 Other forms of acute ischemic heart disease: Secondary | ICD-10-CM | POA: Diagnosis not present

## 2015-02-13 LAB — CBC
HCT: 30.6 % — ABNORMAL LOW (ref 39.0–52.0)
Hemoglobin: 9.8 g/dL — ABNORMAL LOW (ref 13.0–17.0)
MCH: 27 pg (ref 26.0–34.0)
MCHC: 32 g/dL (ref 30.0–36.0)
MCV: 84.3 fL (ref 78.0–100.0)
Platelets: 77 10*3/uL — ABNORMAL LOW (ref 150–400)
RBC: 3.63 MIL/uL — ABNORMAL LOW (ref 4.22–5.81)
RDW: 18.2 % — ABNORMAL HIGH (ref 11.5–15.5)
WBC: 10.9 10*3/uL — ABNORMAL HIGH (ref 4.0–10.5)

## 2015-02-13 LAB — BASIC METABOLIC PANEL
Anion gap: 11 (ref 5–15)
BUN: 25 mg/dL — ABNORMAL HIGH (ref 6–20)
CO2: 23 mmol/L (ref 22–32)
Calcium: 7.1 mg/dL — ABNORMAL LOW (ref 8.9–10.3)
Chloride: 100 mmol/L — ABNORMAL LOW (ref 101–111)
Creatinine, Ser: 1.84 mg/dL — ABNORMAL HIGH (ref 0.61–1.24)
GFR calc Af Amer: 39 mL/min — ABNORMAL LOW (ref >60)
GFR calc non Af Amer: 33 mL/min — ABNORMAL LOW (ref >60)
Glucose, Bld: 96 mg/dL (ref 65–99)
Potassium: 3.4 mmol/L — ABNORMAL LOW (ref 3.5–5.1)
Sodium: 134 mmol/L — ABNORMAL LOW (ref 135–145)

## 2015-02-13 LAB — MAGNESIUM: Magnesium: 2.4 mg/dL (ref 1.7–2.4)

## 2015-02-13 LAB — PHOSPHORUS: Phosphorus: 2.8 mg/dL (ref 2.5–4.6)

## 2015-02-13 MED ORDER — HALOPERIDOL LACTATE 5 MG/ML IJ SOLN
INTRAMUSCULAR | Status: AC
  Filled 2015-02-13: qty 1

## 2015-02-13 MED ORDER — HALOPERIDOL LACTATE 5 MG/ML IJ SOLN
3.0000 mg | Freq: Once | INTRAMUSCULAR | Status: AC
Start: 2015-02-13 — End: 2015-02-13
  Administered 2015-02-13: 3 mg via INTRAVENOUS

## 2015-02-13 MED ORDER — POTASSIUM PHOSPHATES 15 MMOLE/5ML IV SOLN
30.0000 mmol | Freq: Once | INTRAVENOUS | Status: AC
  Administered 2015-02-13: 30 mmol via INTRAVENOUS
  Filled 2015-02-13: qty 10

## 2015-02-13 MED ORDER — HALOPERIDOL LACTATE 5 MG/ML IJ SOLN
1.0000 mg | INTRAMUSCULAR | Status: DC | PRN
  Administered 2015-02-13: 3 mg via INTRAVENOUS
  Administered 2015-02-14: 4 mg via INTRAVENOUS
  Filled 2015-02-13 (×3): qty 1

## 2015-02-13 NOTE — Progress Notes (Signed)
Subjective:  Pt became agitated this am, CCM present  Objective:  Vital Signs in the last 24 hours: Temp:  [98 F (36.7 C)-100.8 F (38.2 C)] 100.8 F (38.2 C) (06/28 0726) Pulse Rate:  [62-162] 103 (06/28 1000) Resp:  [14-35] 30 (06/28 1000) BP: (75-157)/(42-90) 114/64 mmHg (06/28 1000) SpO2:  [70 %-100 %] 100 % (06/28 1000) FiO2 (%):  [60 %-100 %] 100 % (06/27 1923) Weight:  [141 lb 8.6 oz (64.2 kg)] 141 lb 8.6 oz (64.2 kg) (06/28 0500)  Intake/Output from previous day:  Intake/Output Summary (Last 24 hours) at 02/13/15 1054 Last data filed at 02/13/15 0507  Gross per 24 hour  Intake    490 ml  Output      0 ml  Net    490 ml    Physical Exam: Gen: Ill appearing elderly male, on facemask O2 Chest: decreased breath sounds CV: regular rhythm with extra systole Neuro: Currently awake and appropriate   Rate: 110  Rhythm: normal sinus rhythm, premature atrial contractions (PAC) and premature ventricular contractions (PVC)  Lab Results:  Recent Labs  02/12/15 0955 02/13/15 0250  WBC 8.9 10.9*  HGB 10.3* 9.8*  PLT 93* 77*    Recent Labs  02/12/15 0955 02/13/15 0250  NA 133* 134*  K 3.6 3.4*  CL 101 100*  CO2 22 23  GLUCOSE 116* 96  BUN 24* 25*  CREATININE 1.83* 1.84*    Recent Labs  02/10/15 1455 02/10/15 2119  TROPONINI 0.08* 0.08*   No results for input(s): INR in the last 72 hours.  Scheduled Meds: . aspirin EC  81 mg Oral QHS  . atorvastatin  80 mg Oral q1800  . clopidogrel  75 mg Oral QPC supper  . docusate sodium  100 mg Oral BID  . levothyroxine  100 mcg Oral QAC breakfast  . multivitamin with minerals  1 tablet Oral Daily  . omega-3 acid ethyl esters  1 g Oral Q supper  . pantoprazole  40 mg Oral BID  . piperacillin-tazobactam (ZOSYN)  IV  3.375 g Intravenous Q8H  . pneumococcal 23 valent vaccine  0.5 mL Intramuscular Tomorrow-1000  . predniSONE  20 mg Oral Q breakfast  . vancomycin  750 mg Intravenous Q24H   Continuous  Infusions:  PRN Meds:.acetaminophen, HYDROcodone-acetaminophen, ipratropium-albuterol, nitroGLYCERIN, polyethylene glycol   Imaging: Imaging results have been reviewed  Cardiac Studies:Echo 02/11/15 Study Conclusions  - Procedure narrative: Transthoracic echocardiography. Image quality was adequate. The study was technically difficult. - Left ventricle: The cavity size was normal. There was mild focal basal hypertrophy of the septum. Systolic function was normal. The estimated ejection fraction was in the range of 55% to 60%. Wall motion was normal; there were no regional wall motion abnormalities. There was an increased relative contribution of atrial contraction to ventricular filling. Doppler parameters are consistent with abnormal left ventricular relaxation (grade 1 diastolic dysfunction). - Mitral valve: Calcified annulus.   Assessment/Plan:  13M with CAD s/p LAD PCI (2007 with LAD ISR 2013), RA on prednisone and etanercept, hypothyrodism on synthroid, CKD III, history of vascular congestion on CXR without frank history of CHF, normal EF 02/21/14, who has SOB s/p total right shoulder arthroplasty 02/01/2015. Cardiology consulted for respiratory failure secondary to HCAP acute on chronic diastolic CHF, and elevated Troponin (demand ischemia) post OP.  Principal Problem:   S/P shoulder replacement Active Problems:   Acute respiratory failure with hypoxia   Acute diastolic heart failure   Severe sepsis   CAD  S/P LAD/OM PCI '07, LAD ISR-DES 02/2012   Chronic renal insufficiency, stage III (moderate)   Rheumatoid arthritis   HCAP (healthcare-associated pneumonia)   Effect of immunosuppressant therapy   Demand ischemia   Hypertension   Dysphagia   PLAN: Per CCM at this point. VQ low probability, chest CT shows emphysema, Troponin Elevation flat suggestive of demand ischemia. Consider holding Lipitor 80 mg with cirrhosis noted on CT. Continue ASA 81 mg and Plavix for  CAD/DES history. ? Add low dose beta blocker.  Corine Shelter PA-C 02/13/2015, 10:54 AM [phone]  Patient seen and examined and history reviewed. Agree with above findings and plan. Patient with decline in mental status last night with persistent confusion. Hypoxic. Now on face mask but thinks he is getting methane gas. Results of V/Q, dopplers, and CT reviewed. No evidence of CHF or effusions. Suspect hypoxemia more related to emphysema/ PNA/atx. Rhythm NSR with PVCs/PACs. 11 beat run SVT. Agree with holding diuretics. Little else to offer from cardiac standpoint. We will sign off. Please call if you have questions.   Sarinah Doetsch [place], MDFACC 02/13/2015 12:54 PM

## 2015-02-13 NOTE — Progress Notes (Signed)
Occupational Therapy Treatment Patient Details Name: Tavis Kring MRN: [id number] DOB: 10/05/1934 Today's Date: 02/13/2015    History of present illness This 80 y.o male admitted for reverse TSA.  Pt was transferred to ICU 02/10/15 due to sepsis secondary to PNA.   PMH: includes severe RA with chronic steroid use   OT comments  Pt lethargic and confused this date - just received Haldol.  AAROM/PROM Rt UE per protoccol. Pt tolerated well.  He did indicate pain with ER and ROM limited neutral to 10* ER today.  Will follow.   Follow Up Recommendations  SNF    Equipment Recommendations  None recommended by OT    Recommendations for Other Services      Precautions / Restrictions Precautions Precautions: Fall;Shoulder Type of Shoulder Precautions: AROM/PROM 90 FF; 60 abd; 30 ER Shoulder Interventions: Shoulder sling/immobilizer;Off for dressing/bathing/exercises Precaution Booklet Issued: Yes (comment) Precaution Comments: see limitations above  Required Braces or Orthoses: Sling Restrictions Weight Bearing Restrictions: Yes RUE Weight Bearing: Non weight bearing       Mobility Bed Mobility                  Transfers                      Balance                                   ADL                                                Vision                     Perception     Praxis      Cognition   Behavior During Therapy: Anxious Overall Cognitive Status: Impaired/Different from baseline Area of Impairment: Orientation;Attention;Following commands Orientation Level: Disoriented to;Place;Time;Situation Current Attention Level: Focused Memory: Decreased recall of precautions;Decreased short-term memory    Safety/Judgement: Decreased awareness of safety     General Comments: Pt with agitation this date and given Haldol.   Pt lethargic with brief periods of arousal.  Pt confused       Extremity/Trunk  Assessment               Exercises Shoulder Exercises Shoulder Flexion: AAROM;Right;20 reps;Supine (85) Shoulder ABduction: AAROM;Right;20 reps (60) Shoulder External Rotation: AAROM;Right;20 reps;Supine (30) Elbow Flexion: AAROM;Right;20 reps;Supine Elbow Extension: AAROM;Right;20 reps;Supine Wrist Flexion: PROM;Right;20 reps;Supine;AAROM Wrist Extension: PROM;Right;20 reps;Supine;AAROM Digit Composite Flexion: PROM;Right;20 reps;Supine Composite Extension: PROM;Right;15 reps;Supine   Shoulder Instructions       General Comments      Pertinent Vitals/ Pain       Pain Assessment: Faces Faces Pain Scale: Hurts even more Pain Location: Rt shoulder with ER Pain Descriptors / Indicators: Guarding;Grimacing Pain Intervention(s): Limited activity within patient's tolerance;Repositioned;Monitored during session  Home Living                                          Prior Functioning/Environment              Frequency Min 3X/week     Progress Toward Goals  OT Goals(current goals can  now be found in the care plan section)  Progress towards OT goals: Not progressing toward goals - comment  ADL Goals Pt Will Transfer to Toilet: with min guard assist;ambulating Pt Will Perform Toileting - Clothing Manipulation and hygiene: with min assist;sit to/from stand Pt/caregiver will Perform Home Exercise Program: Increased ROM;Increased strength;Right Upper extremity;With written HEP provided Additional ADL Goal #1: Pt will be min guard A in and OOB for BADLs  Plan Discharge plan remains appropriate    Co-evaluation                 End of Session Equipment Utilized During Treatment: Other (comment) (sling )   Activity Tolerance Patient limited by lethargy   Patient Left in bed;with call bell/phone within reach;with nursing/sitter in room   Nurse Communication          Time: 1062-6948 OT Time Calculation (min): 11 min  Charges: OT General  Charges $OT Visit: 1 Procedure OT Treatments $Therapeutic Exercise: 8-22 mins  Yailen Zemaitis M 02/13/2015, 2:21 PM

## 2015-02-13 NOTE — Progress Notes (Signed)
ANTIBIOTIC CONSULT NOTE - Follow-up  Pharmacy Consult for vancomycin and zosyn Indication: pneumonia  Allergies  Allergen Reactions  . Aspirin Swelling    Only high doses of aspirin (given for arthritis).; "swelled face, lips, etc"  . Shellfish Allergy Anaphylaxis, Nausea And Vomiting and Swelling  . Lisinopril Cough  . Pantoprazole Other (See Comments)    dizzy    Patient Measurements: Height: 5\' 6"  (167.6 cm) Weight: 141 lb 8.6 oz (64.2 kg) IBW/kg (Calculated) : 63.8   Vital Signs: Temp: 100.3 F (37.9 C) (06/28 1137) Temp Source: Axillary (06/28 1137) BP: 131/73 mmHg (06/28 1100) Pulse Rate: 100 (06/28 1100) Intake/Output from previous day: 06/27 0701 - 06/28 0700 In: 730 [P.O.:480; IV Piggyback:250] Out: 300 [Urine:300] Intake/Output from this shift:    Labs:  Recent Labs  02/11/15 0528 02/12/15 0955 02/13/15 0250  WBC 9.3 8.9 10.9*  HGB 10.4* 10.3* 9.8*  PLT 100* 93* 77*  CREATININE 2.10* 1.83* 1.84*   Estimated Creatinine Clearance: 29.9 mL/min (by C-G formula based on Cr of 1.84). No results for input(s): VANCOTROUGH, VANCOPEAK, VANCORANDOM, GENTTROUGH, GENTPEAK, GENTRANDOM, TOBRATROUGH, TOBRAPEAK, TOBRARND, AMIKACINPEAK, AMIKACINTROU, AMIKACIN in the last 72 hours.   Microbiology: Recent Results (from the past 720 hour(s))  Surgical pcr screen     Status: None   Collection Time: 02/01/15 10:45 AM  Result Value Ref Range Status   MRSA, PCR NEGATIVE NEGATIVE Final   Staphylococcus aureus NEGATIVE NEGATIVE Final    Comment:        The Xpert SA Assay (FDA approved for NASAL specimens in patients over 41 years of age), is one component of a comprehensive surveillance program.  Test performance has been validated by [facility] for patients greater than or equal to 65 year old. It is not intended to diagnose infection nor to guide or monitor treatment.   Culture, blood (routine x 2)     Status: None (Preliminary result)   Collection Time:  02/10/15 10:07 PM  Result Value Ref Range Status   Specimen Description BLOOD LEFT ANTECUBITAL  Final   Special Requests BOTTLES DRAWN AEROBIC ONLY 1CC  Final   Culture NO GROWTH 1 DAY  Final   Report Status PENDING  Incomplete  Culture, blood (routine x 2)     Status: None (Preliminary result)   Collection Time: 02/10/15 10:17 PM  Result Value Ref Range Status   Specimen Description BLOOD LEFT HAND  Final   Special Requests BOTTLES DRAWN AEROBIC ONLY 5CC  Final   Culture NO GROWTH 1 DAY  Final   Report Status PENDING  Incomplete  Urine culture     Status: None   Collection Time: 02/11/15  9:44 AM  Result Value Ref Range Status   Specimen Description URINE, CLEAN CATCH  Final   Special Requests NONE  Final   Culture NO GROWTH 1 DAY  Final   Report Status 02/12/2015 FINAL  Final   Assessment: 80 yom continues on D#4 for vancomycin + zosyn for HCAP. Tmax is 101.4 and WBC is 10.9. SCr elevated at 1.84 but stable from yesterday. Cultures are negative so far. Doses remain appropriate.  6/25 Vanc>> 6/25 Zosyn>>  6/25 blood x2>>NGTD 6/26 urine>>NEG   Goal of Therapy:  Vancomycin trough level 15-20 mcg/ml  Plan:  - Continue vancomycin 750mg  IV Q24H - Continue zosyn 3.375gm IV Q8H (4 hr inf) - F/u renal fxn, C&S, clinical status and trough at [facility], PharmD, BCPS Pager # [phone] 02/13/2015 12:30 PM

## 2015-02-13 NOTE — Progress Notes (Signed)
Orthopedics Progress Note  Subjective: Patient reports no pain in the shoulder this morning  Objective:  Filed Vitals:   02/13/15 0726  BP:   Pulse:   Temp: 100.8 F (38.2 C)  Resp:     General: Awake and alert  Musculoskeletal: right shoulder wound benign, no swelling no erythema Neurovascularly intact  Lab Results  Component Value Date   WBC 10.9* 02/13/2015   HGB 9.8* 02/13/2015   HCT 30.6* 02/13/2015   MCV 84.3 02/13/2015   PLT 77* 02/13/2015       Component Value Date/Time   NA 134* 02/13/2015 0250   K 3.4* 02/13/2015 0250   CL 100* 02/13/2015 0250   CO2 23 02/13/2015 0250   GLUCOSE 96 02/13/2015 0250   BUN 25* 02/13/2015 0250   CREATININE 1.84* 02/13/2015 0250   CALCIUM 7.1* 02/13/2015 0250   GFRNONAA 33* 02/13/2015 0250   GFRAA 39* 02/13/2015 0250    Lab Results  Component Value Date   INR 2.3* 08/28/2007   INR 1.1 08/27/2007   INR 1.0 08/25/2007    Assessment/Plan: POD #4 s/p Procedure(s): RIGHT REVERSE SHOULDER TOTAL ARTHROPLASTY Appreciate excellent care that he is receiving here in the ICU and from the CCM team.  Will continue to monitor clinical progress.  Therapy as able. Thanks  Almedia Balls. Ranell Patrick, MD 02/13/2015 7:41 AM

## 2015-02-13 NOTE — Progress Notes (Signed)
PULMONARY / CRITICAL CARE MEDICINE   Name: [name] [name] MRN: [id number] DOB: 1935/03/18    ADMISSION DATE:  01/20/2015  CHIEF COMPLAINT:  Hypoxic Resp Failure  INITIAL PRESENTATION: 49 M with RA on enteracept and CAD who underwent a shoulder atheroplasty on 6/24. Post-operatively he was hypoxemic and cardiology was consulted. Early on 6/26 he developed worsening hypoxemia and PCCM was consulted.   STUDIES:  ABG on NRB 7.421/37/74.3  SIGNIFICANT EVENTS: 6/26 transfer to ICU for hypotension and hypoxemia.  HISTORY OF PRESENT ILLNESS:  [name] [name] is a 54 M with RA on enteracept and CAD who underwent a shoulder atheroplasty on 6/24. Post-operatively he was hypoxemic and cardiology was consulted. It did not improve with diuresis and he developed AKI on CKD. Early on 6/26 he developed worsening hypoxemia and PCCM was consulted. He is currently without complaint. He denies feeling feverish, chills, CP, SOB, or cough.   SUBJECTIVE: Extremely confused overnight and this AM, specially when more hypoxemic.  VITAL SIGNS: Temp:  [98 F (36.7 C)-100.8 F (38.2 C)] 100.8 F (38.2 C) (06/28 0726) Pulse Rate:  [62-162] 103 (06/28 1000) Resp:  [14-35] 30 (06/28 1000) BP: (75-157)/(42-90) 114/64 mmHg (06/28 1000) SpO2:  [70 %-100 %] 100 % (06/28 1000) FiO2 (%):  [60 %-100 %] 100 % (06/27 1923) Weight:  [64.2 kg (141 lb 8.6 oz)] 64.2 kg (141 lb 8.6 oz) (06/28 0500) HEMODYNAMICS:   VENTILATOR SETTINGS: Vent Mode:  [-]  FiO2 (%):  [60 %-100 %] 100 % INTAKE / OUTPUT:  Intake/Output Summary (Last 24 hours) at 02/13/15 1058 Last data filed at 02/13/15 0507  Gross per 24 hour  Intake    490 ml  Output      0 ml  Net    490 ml   PHYSICAL EXAMINATION: General:  Elderly M in NAD on NRB Neuro:  Intact HEENT:  Sclera anicteric, conjunctiva pink, MMM, OP Clear Cardiovascular:  RRR, NS1/S2, (-) MRG Lungs:  Rales at bases Abdomen:  S/NT/ND/(+)BS Musculoskeletal:  (-) C/C/E Skin:   Intact  LABS:  CBC  Recent Labs Lab 02/11/15 0528 02/12/15 0955 02/13/15 0250  WBC 9.3 8.9 10.9*  HGB 10.4* 10.3* 9.8*  HCT 32.2* 31.9* 30.6*  PLT 100* 93* 77*   Coag's No results for input(s): APTT, INR in the last 168 hours. BMET  Recent Labs Lab 02/11/15 0528 02/12/15 0955 02/13/15 0250  NA 133* 133* 134*  K 3.7 3.6 3.4*  CL 102 101 100*  CO2 22 22 23   BUN 31* 24* 25*  CREATININE 2.10* 1.83* 1.84*  GLUCOSE 110* 116* 96   Electrolytes  Recent Labs Lab 02/11/15 0528 02/12/15 0955 02/13/15 0250  CALCIUM 7.3* 7.4* 7.1*  MG 1.6*  --  2.4  PHOS 3.9  --  2.8   Sepsis Markers  Recent Labs Lab 02/10/15 2207  LATICACIDVEN 1.5   ABG  Recent Labs Lab 02/08/2015 1835 02/10/15 2210  PHART 7.423 7.421  PCO2ART 32.2* 37.1  PO2ART 78.7* 74.3*   Liver Enzymes No results for input(s): AST, ALT, ALKPHOS, BILITOT, ALBUMIN in the last 168 hours. Cardiac Enzymes  Recent Labs Lab 02/10/15 0911 02/10/15 1455 02/10/15 2119  TROPONINI 0.12* 0.08* 0.08*   Glucose  Recent Labs Lab 02/11/15 0335 02/11/15 0812  GLUCAP 107* 98   Imaging Ct Chest High Resolution  02/12/2015   CLINICAL DATA:  Interstitial lung disease, shortness of breath.  EXAM: CT CHEST WITHOUT CONTRAST  TECHNIQUE: Multidetector CT imaging of the chest was performed following the standard  protocol without intravenous contrast. High resolution imaging of the lungs, as well as inspiratory and expiratory imaging, was performed.  COMPARISON:  None.  FINDINGS: Mediastinum/Nodes: Mediastinal lymph nodes are not enlarged by CT size criteria. Hilar regions are difficult to definitively evaluate without IV contrast. No axillary adenopathy. Atherosclerotic calcification of the arterial vasculature, including coronary arteries. Heart is enlarged. No pericardial effusion.  Lungs/Pleura: Severe centrilobular emphysema with paraseptal components. Assessment for interstitial lung disease is somewhat compromised by  respiratory motion. Mild collapse/ consolidation in the subpleural right lower lobe. Mild subpleural atelectasis in the left lower lobe. No definitive subpleural reticulation, traction bronchiectasis/bronchiolectasis, ground-glass, architectural distortion or honeycombing. Inspiratory and expiratory imaging appears to be in the same phase, limiting evaluation for air trapping. No pleural fluid. Airway is unremarkable.  Upper abdomen: Low-attenuation lesions in the liver measure up to 1.6 cm in the right hepatic lobe, difficult to characterize without post-contrast imaging. Liver margin appears slightly irregular. Visualized portions of the adrenal glands and right kidney are unremarkable. 4 mm exophytic low-attenuation lesion off the upper pole left kidney is too small to characterize. Visualized portions of the spleen, pancreas, stomach and bowel are grossly unremarkable. No upper abdominal adenopathy.  Musculoskeletal: No worrisome lytic or sclerotic lesions. Degenerative changes are seen in the spine. Bilateral shoulder arthroplasties. Associated locules of subcutaneous air on the right indicates recent surgery. Healing or healed sternal fracture.  IMPRESSION: 1. Image quality is degraded by respiratory motion. No definite evidence of interstitial lung disease superimposed on severe emphysema. 2. Collapse/consolidation in the right lower lobe may be due to atelectasis. Pneumonia is not excluded. 3. Three-vessel coronary artery calcification. 4. Cirrhosis. Low-attenuation lesions in the liver cannot be further characterized without post-contrast imaging. Consider MR abdomen without and with contrast in further evaluation, as clinically indicated.   Electronically Signed   By: Leanna Battles M.D.   On: 02/12/2015 13:34   Nm Pulmonary Perf And Vent  02/12/2015   CLINICAL DATA:  Shortness of breath for 2 days post RIGHT shoulder surgery  NUCLEAR MEDICINE VENTILATION - PERFUSION LUNG SCAN  TECHNIQUE: Ventilation  images were obtained in multiple projections using inhaled aerosol Tc-32m DTPA. Perfusion images were obtained in multiple projections after intravenous injection of Tc-58m MAA.  RADIOPHARMACEUTICALS:  40.0 mCi Technetium-58m DTPA aerosol inhalation and 6.0 mCi Technetium-59m MAA IV  COMPARISON:  None; correlation made with chest radiograph 02/10/2015 and CT chest of 02/12/2015  FINDINGS: Ventilation: Diminished ventilation at the apices. Corresponding emphysematous changes at both apices on CT. No other definite ventilatory defects.  Perfusion: Photopenic defect RIGHT upper lobe on lateral view corresponding to artifact from RIGHT shoulder prosthesis. Minimal irregularity of perfusion at the posterior lung bases corresponding to lower lobe atelectasis on CT. No other perfusion defects identified.  IMPRESSION: Very low probability for pulmonary embolism.   Electronically Signed   By: Ulyses Southward M.D.   On: 02/12/2015 14:17   Dg Chest Port 1 View  02/13/2015   CLINICAL DATA:  Bullous emphysema, coronary artery disease  EXAM: PORTABLE CHEST - 1 VIEW  COMPARISON:  CT scan of the chest dated February 12, 2015  FINDINGS: The lungs are adequately inflated. The interstitial markings are increased diffusely greatest in the mid and lower lung zones. There is no significant pleural effusion. The cardiac silhouette is mildly enlarged. The central pulmonary vascularity is prominent. There is mural calcification in the aortic arch and wall of the descending thoracic aorta. The bony thorax exhibits no acute abnormality. There are bilateral  shoulder prostheses.  IMPRESSION: Known emphysema and interstitial lung disease without focal pneumonia. Mild enlargement of the cardiac silhouette without pulmonary edema.   Electronically Signed   By: David  [place] M.D.   On: 02/13/2015 07:12    I reviewed CXR myself, concern for ILD.  ASSESSMENT / PLAN:  PULMONARY A:  Acute hypoxic respiratory failure: Most likely 2/2 pneumonia but  concern is for unrecognized ILD with severe RA. No evidence of fluid overload on exam currently. PE in DDx as well.  Another concern is aspiration. P:   Supplemental O2 to maintain sats ?>= 92% Aggressive HCAP coverage LE dopplers negative V/Q scan to r/o PE negative Chest CT without contrast ordered for ?ILD with high resolution cuts severe emphysema and  Swallow evaluation ordered and pending. Double PPI and d/c H2 blocker. Continue prednisone at 20 mg then taper down to 5 mg PO daily (patient's home dose). Titrate O2 for sat of 88-92%.  CARDIOVASCULAR A:  Sepsis: Likely 2/2 HCAP Hypotension: Transient, presumably 2/2 sepsis. Improved following antibiotics. CAD Presumed HFpEF: Appears euvolemic on exam.  Elevated Troponin: Echo with EF of 55-60% with grade one diastolic dysfunction. P:  KVO IVF Steroids as above Follow-up cards Cont OP CAD/HTN Regimen  RENAL A:   AKI on CKD: Presumably 2/2 sepsis Hyponatremia P:   IVF Monitor  GASTROINTESTINAL A:  Concern for aspiration (patient reports significant history). P:   - Speech therapy to evaluate. - Double up on PPI  HEMATOLOGIC A:  Anemia: Chronic. Likely AOCD. P:  Consider Fe Studies as OP  INFECTIOUS A:  HCAP P:   BCx2: NGTD UC: NGTD Sputum: NGTD Abx: Vanc/Zosyn, start date 6/26, day 2/8  ENDOCRINE A:   Elevated Blood Glucose   Hypthyroidism P:   SSI A1c Cont Levothyroxine  NEUROLOGIC A:  Very confused this AM with narcs and hypoxemia and sun downing. P: Haldol PRN. Maintain O2. Minimize narcs as able.  RHEUMATIC: A:  RA:  P: Monitor  FAMILY  - Updates: Family updated bedside, after discussion they indicated that patient would like to be DNR.  Films reviewed with family.  Will make DNR and continue active treatment.  - Inter-disciplinary family meet or Palliative Care meeting due by:  7/3   TODAY'S SUMMARY: Change to 50% mask.  Make DNR.  Hold diureses.  Monitor in ICU for signs of  respiratory failure.  The patient is critically ill with multiple organ systems failure and requires high complexity decision making for assessment and support, frequent evaluation and titration of therapies, application of advanced monitoring technologies and extensive interpretation of multiple databases.   Critical Care Time devoted to patient care services described in this note is  35  Minutes. This time reflects time of care of this signee Dr Koren Bound. This critical care time does not reflect procedure time, or teaching time or supervisory time of PA/NP/Med student/Med Resident etc but could involve care discussion time.  Alyson Reedy, M.D. [facility] Pulmonary/Critical Care Medicine. Pager: [phone]. After hours pager: [phone].  02/13/2015, 10:58 AM

## 2015-02-13 NOTE — Progress Notes (Signed)
Patient agitated, delirious, and O2 sats dropped. Dr. Molli Knock ordered 3mg  of Haldol. Patient placed on high flow venturi mask.

## 2015-02-14 DIAGNOSIS — M069 Rheumatoid arthritis, unspecified: Secondary | ICD-10-CM

## 2015-02-14 LAB — CBC
HCT: 29 % — ABNORMAL LOW (ref 39.0–52.0)
Hemoglobin: 9.4 g/dL — ABNORMAL LOW (ref 13.0–17.0)
MCH: 26.7 pg (ref 26.0–34.0)
MCHC: 32.4 g/dL (ref 30.0–36.0)
MCV: 82.4 fL (ref 78.0–100.0)
PLATELETS: 65 10*3/uL — AB (ref 150–400)
RBC: 3.52 MIL/uL — AB (ref 4.22–5.81)
RDW: 18.2 % — ABNORMAL HIGH (ref 11.5–15.5)
WBC: 14.7 10*3/uL — AB (ref 4.0–10.5)

## 2015-02-14 LAB — BASIC METABOLIC PANEL
Anion gap: 11 (ref 5–15)
BUN: 25 mg/dL — ABNORMAL HIGH (ref 6–20)
CALCIUM: 7.3 mg/dL — AB (ref 8.9–10.3)
CO2: 25 mmol/L (ref 22–32)
CREATININE: 1.57 mg/dL — AB (ref 0.61–1.24)
Chloride: 101 mmol/L (ref 101–111)
GFR, EST AFRICAN AMERICAN: 47 mL/min — AB (ref >60)
GFR, EST NON AFRICAN AMERICAN: 41 mL/min — AB (ref >60)
Glucose, Bld: 113 mg/dL — ABNORMAL HIGH (ref 65–99)
Potassium: 3.7 mmol/L (ref 3.5–5.1)
Sodium: 137 mmol/L (ref 135–145)

## 2015-02-14 LAB — PHOSPHORUS: PHOSPHORUS: 2.8 mg/dL (ref 2.5–4.6)

## 2015-02-14 LAB — MAGNESIUM: Magnesium: 2.2 mg/dL (ref 1.7–2.4)

## 2015-02-14 LAB — PROTIME-INR
INR: 1.16 (ref 0.00–1.49)
Prothrombin Time: 15 seconds (ref 11.6–15.2)

## 2015-02-14 MED ORDER — TRAZODONE HCL 50 MG PO TABS
50.0000 mg | ORAL_TABLET | Freq: Every day | ORAL | Status: DC
  Administered 2015-02-14: 50 mg via ORAL
  Filled 2015-02-14: qty 1

## 2015-02-14 MED ORDER — ZOLPIDEM TARTRATE 5 MG PO TABS: 5.0000 mg | ORAL_TABLET | Freq: Every evening | ORAL | Status: DC | PRN

## 2015-02-14 MED ORDER — PREDNISONE 10 MG PO TABS
10.0000 mg | ORAL_TABLET | Freq: Every day | ORAL | Status: DC
  Administered 2015-02-15: 10 mg via ORAL
  Filled 2015-02-14 (×5): qty 1

## 2015-02-14 NOTE — Progress Notes (Signed)
Orthopedics Progress Note  Subjective: Patient doing some better today.  Transfer from the ICU imminent.  Objective:  Filed Vitals:   02/14/15 1600  BP: 130/97  Pulse: 107  Temp:   Resp: 17    General: Awake and alert  Musculoskeletal: right shoulder dressing changed, the wound looks great.  Mod generalized swelling around the distal arm and elbow but not at the wrist or hand.  No erythema, no drainage, no pain at all with AAROM of the shoulder Neurovascularly intact  Lab Results  Component Value Date   WBC 14.7* 02/14/2015   HGB 9.4* 02/14/2015   HCT 29.0* 02/14/2015   MCV 82.4 02/14/2015   PLT 65* 02/14/2015       Component Value Date/Time   NA 137 02/14/2015 0233   K 3.7 02/14/2015 0233   CL 101 02/14/2015 0233   CO2 25 02/14/2015 0233   GLUCOSE 113* 02/14/2015 0233   BUN 25* 02/14/2015 0233   CREATININE 1.57* 02/14/2015 0233   CALCIUM 7.3* 02/14/2015 0233   GFRNONAA 41* 02/14/2015 0233   GFRAA 47* 02/14/2015 0233    Lab Results  Component Value Date   INR 2.3* 08/28/2007   INR 1.1 08/27/2007   INR 1.0 08/25/2007    Assessment/Plan: POD #5 s/p Procedure(s): RIGHT REVERSE SHOULDER TOTAL ARTHROPLASTY Patient to be transferred to med/surg bed.  Cards and CCM signing off, will ask the [facility] hospitalists to consult with [place] to manage the medical issues/ medications. From a shoulder standpoint, the patient continues to do very well.  Will request CIR consult per therapy recommendation as the patient would benefit from extended medical care to make sure that he is not a bounce back admit if we were to discharge him.  Almedia Balls. Ranell Patrick, MD 02/14/2015 4:38 PM

## 2015-02-14 NOTE — Progress Notes (Signed)
PULMONARY / CRITICAL CARE MEDICINE   Name: [name] [name] MRN: [id number] DOB: [birth year]    ADMISSION DATE:  02/04/2015  CHIEF COMPLAINT:  Hypoxic Resp Failure  INITIAL PRESENTATION: 65 M with RA on enteracept and CAD who underwent a shoulder atheroplasty on 6/24. Post-operatively he was hypoxemic and cardiology was consulted. Early on 6/26 he developed worsening hypoxemia and PCCM was consulted.   STUDIES:  ABG on NRB 7.421/37/74.3  SIGNIFICANT EVENTS: 6/26 transfer to ICU for hypotension and hypoxemia.  HISTORY OF PRESENT ILLNESS:  [name] [name] is a 74 M with RA on enteracept and CAD who underwent a shoulder atheroplasty on 6/24. Post-operatively he was hypoxemic and cardiology was consulted. It did not improve with diuresis and he developed AKI on CKD. Early on 6/26 he developed worsening hypoxemia and PCCM was consulted. He is currently without complaint. He denies feeling feverish, chills, CP, SOB, or cough.   SUBJECTIVE: Down to [place], intermittent confusion, asking to go home, complaining of no rest overnight.  VITAL SIGNS: Temp:  [97 F (36.1 C)-100.3 F (37.9 C)] 97.7 F (36.5 C) (06/29 0755) Pulse Rate:  [44-136] 94 (06/29 0600) Resp:  [14-30] 25 (06/29 0600) BP: (93-145)/(37-117) 145/77 mmHg (06/29 0600) SpO2:  [85 %-100 %] 99 % (06/29 0600) Weight:  [61.5 kg (135 lb 9.3 oz)] 61.5 kg (135 lb 9.3 oz) (06/29 0415) HEMODYNAMICS:   VENTILATOR SETTINGS:   INTAKE / OUTPUT:  Intake/Output Summary (Last 24 hours) at 02/14/15 0957 Last data filed at 02/14/15 0506  Gross per 24 hour  Intake    980 ml  Output      0 ml  Net    980 ml   PHYSICAL EXAMINATION: General:  Elderly M in NAD on 4L [place]. Neuro:  Confused at times but appropriate this AM. HEENT:  Sclera anicteric, conjunctiva pink, MMM, OP Clear Cardiovascular:  RRR, NS1/S2, (-) MRG Lungs:  Rales at bases Abdomen:  S/NT/ND/(+)BS Musculoskeletal:  (-) C/C/E Skin:  Intact  LABS:  CBC  Recent Labs Lab  02/12/15 0955 02/13/15 0250 02/14/15 0233  WBC 8.9 10.9* 14.7*  HGB 10.3* 9.8* 9.4*  HCT 31.9* 30.6* 29.0*  PLT 93* 77* 65*   Coag's No results for input(s): APTT, INR in the last 168 hours. BMET  Recent Labs Lab 02/12/15 0955 02/13/15 0250 02/14/15 0233  NA 133* 134* 137  K 3.6 3.4* 3.7  CL 101 100* 101  CO2 22 23 25   BUN 24* 25* 25*  CREATININE 1.83* 1.84* 1.57*  GLUCOSE 116* 96 113*   Electrolytes  Recent Labs Lab 02/11/15 0528 02/12/15 0955 02/13/15 0250 02/14/15 0233  CALCIUM 7.3* 7.4* 7.1* 7.3*  MG 1.6*  --  2.4 2.2  PHOS 3.9  --  2.8 2.8   Sepsis Markers  Recent Labs Lab 02/10/15 2207  LATICACIDVEN 1.5   ABG  Recent Labs Lab 02/15/2015 1835 02/10/15 2210  PHART 7.423 7.421  PCO2ART 32.2* 37.1  PO2ART 78.7* 74.3*   Liver Enzymes No results for input(s): AST, ALT, ALKPHOS, BILITOT, ALBUMIN in the last 168 hours. Cardiac Enzymes  Recent Labs Lab 02/10/15 0911 02/10/15 1455 02/10/15 2119  TROPONINI 0.12* 0.08* 0.08*   Glucose  Recent Labs Lab 02/11/15 0335 02/11/15 0812  GLUCAP 107* 98   Imaging No results found.  I reviewed CXR myself, concern for ILD.  ASSESSMENT / PLAN:  PULMONARY A:  Acute hypoxic respiratory failure: Most likely 2/2 pneumonia but concern is for unrecognized ILD with severe RA. No evidence of fluid overload  on exam currently. PE in DDx as well.  Another concern is aspiration. P:   Supplemental O2 to maintain sats ?>= 92% Aggressive HCAP coverage, will need a total of 8 days of abx. LE dopplers negative V/Q scan to r/o PE negative Chest CT without contrast ordered for ?ILD with high resolution cuts severe emphysema and evidence of some fibrosis. Swallow evaluation noted. Double PPI and d/c H2 blocker. Home dose of prednisone 5 mg PO daily, will decrease to 10 mg PO daily x5 days then decrease to 5 and maintain at that. Titrate O2 for sat of 88-92%. Ambulatory desat study   CARDIOVASCULAR A:  Sepsis:  Likely 2/2 HCAP Hypotension: Transient, presumably 2/2 sepsis. Improved following antibiotics. CAD Presumed HFpEF: Appears euvolemic on exam.  Elevated Troponin: Echo with EF of 55-60% with grade one diastolic dysfunction. P:  KVO IVF  Steroids as above Follow-up cards Cont OP CAD/HTN Regimen  RENAL A:   AKI on CKD: Presumably 2/2 sepsis Hyponatremia P:   KVO IVF Monitor  GASTROINTESTINAL A:  Concern for aspiration (patient reports significant history). P:   Speech therapy to evaluate. Double up on PPI  HEMATOLOGIC A:  Anemia: Chronic. Likely AOCD. P:  Consider Fe Studies as OP  INFECTIOUS A:  HCAP P:   BCx2: NGTD UC: NGTD Sputum: NGTD Abx: Vanc/Zosyn, start date 6/26, day 4/8  ENDOCRINE A:   Elevated Blood Glucose Hypthyroidism P:   SSI A1c Cont Levothyroxine  NEUROLOGIC A:  Very confused this AM with narcs and hypoxemia and sun downing. P: Haldol PRN. Maintain O2. Minimize narcs as able.  RHEUMATIC: A:  RA:  P: Steroids as ordered.  FAMILY  - Updates: More interactive this AM, start ambien at PM, will transfer to med-surg.    - Inter-disciplinary family meet or Palliative Care meeting due by:  7/3   TODAY'S SUMMARY: Down to 4L [place], will transfer out of the ICU, start ambien PRN and PCCM will sign off.  Alyson Reedy, M.D. [facility] Pulmonary/Critical Care Medicine. Pager: [phone]. After hours pager: [phone].  02/14/2015, 9:57 AM

## 2015-02-14 NOTE — Progress Notes (Signed)
Occupational Therapy Treatment Patient Details Name: [name] [name] MRN: [id number] DOB: 1935-07-31 Today's Date: 02/14/2015    History of present illness This 80 y.o male admitted for reverse TSA.  Pt was transferred to ICU 02/10/15 due to sepsis secondary to PNA.   PMH: includes severe RA with chronic steroid use   OT comments  Pt is improving, but is still with delerium/confusion and lethargic due to not sleeping x 2 days.  Pt is now deconditioned from the events of last 4 days.  He requires max A - total A for ADLs and mod A for functional mobility.  He required intermittent assistance PTA.  Feel he may benefit from CIR to allow him to return to min A with ADLs and discharge home with family.     Follow Up Recommendations  CIR;Supervision/Assistance - 24 hour    Equipment Recommendations  3 in 1 bedside comode    Recommendations for Other Services Rehab consult    Precautions / Restrictions Precautions Precautions: Fall;Shoulder Type of Shoulder Precautions: AROM/PROM 90 FF; 60 abd; 30 ER Shoulder Interventions: Shoulder sling/immobilizer;Off for dressing/bathing/exercises Precaution Booklet Issued: Yes (comment) Precaution Comments: see limitations above  Required Braces or Orthoses: Sling Restrictions Weight Bearing Restrictions: Yes RUE Weight Bearing: Non weight bearing       Mobility Bed Mobility Overal bed mobility: Needs Assistance Bed Mobility: Supine to Sit Rolling: Mod assist         General bed mobility comments: max verbal cues for sequencing and assist to move LEs off EOB and to lift shoulders   Transfers Overall transfer level: Needs assistance Equipment used: 1 person hand held assist Transfers: Sit to/from [employer] Transfers Sit to Stand: Mod assist Stand pivot transfers: Mod assist            Balance Overall balance assessment: Needs assistance Sitting-balance support: Feet supported Sitting balance-Leahy Scale: Poor     Standing  balance support: Single extremity supported Standing balance-Leahy Scale: Poor                     ADL Overall ADL's : Needs assistance/impaired Eating/Feeding: Maximal assistance;Bed level;Sitting   Grooming: Wash/dry hands;Wash/dry face;Brushing hair;Maximal assistance;Sitting;Bed level   Upper Body Bathing: Maximal assistance;Sitting;Bed level   Lower Body Bathing: Total assistance;Bed level;Sit to/from stand   Upper Body Dressing : Total assistance;Sitting   Lower Body Dressing: Total assistance;Sit to/from stand   Toilet Transfer: Moderate assistance;Stand-pivot;BSC   Toileting- Clothing Manipulation and Hygiene: Total assistance;Sit to/from stand       Functional mobility during ADLs: Moderate assistance General ADL Comments: Pt limited due to impaired cognition and level of fatigue due to not sleeping  He is now deconditioned due to events of last few days       Vision                     Perception     Praxis      Cognition   Behavior During Therapy: Restless Overall Cognitive Status: Impaired/Different from baseline Area of Impairment: Orientation;Attention;Following commands;Safety/judgement;Awareness;Problem solving;Memory Orientation Level: Disoriented to;Place;Time;Situation Current Attention Level: Focused Memory: Decreased recall of precautions;Decreased short-term memory  Following Commands: Follows one step commands with increased time Safety/Judgement: Decreased awareness of safety;Decreased awareness of deficits   Problem Solving: Slow processing;Difficulty sequencing;Requires verbal cues General Comments: Pt very restless, and delerious.  Per RN, pt has not slept in 2 days    Extremity/Trunk Assessment  Exercises Shoulder Exercises Shoulder Flexion: AAROM;Right;20 reps;Supine (85) Shoulder ABduction: AAROM;Right;20 reps (60) Shoulder External Rotation: AAROM;Right;20 reps;Supine (30) Elbow Flexion:  AAROM;Right;20 reps;Supine Elbow Extension: AAROM;Right;20 reps;Supine Wrist Flexion: PROM;Right;Supine;AAROM;5 reps Wrist Extension: PROM;Right;Supine;AAROM;5 reps Donning/doffing shirt without moving shoulder: Maximal assistance Correct positioning of sling/immobilizer: Maximal assistance Sling wearing schedule (on at all times/off for ADL's): Maximal assistance Proper positioning of operated UE when showering: Maximal assistance Positioning of UE while sleeping: Maximal assistance   Shoulder Instructions Shoulder Instructions Donning/doffing shirt without moving shoulder: Maximal assistance Correct positioning of sling/immobilizer: Maximal assistance Sling wearing schedule (on at all times/off for ADL's): Maximal assistance Proper positioning of operated UE when showering: Maximal assistance Positioning of UE while sleeping: Maximal assistance     General Comments      Pertinent Vitals/ Pain       Pain Assessment: Faces Faces Pain Scale: Hurts a little bit Pain Location: Rt shoulder Pain Descriptors / Indicators: Grimacing;Guarding Pain Intervention(s): Monitored during session  Home Living                                          Prior Functioning/Environment              Frequency Min 3X/week     Progress Toward Goals  OT Goals(current goals can now be found in the care plan section)  Progress towards OT goals: Progressing toward goals  ADL Goals Pt Will Transfer to Toilet: with min guard assist;ambulating Pt Will Perform Toileting - Clothing Manipulation and hygiene: with min assist;sit to/from stand Pt/caregiver will Perform Home Exercise Program: Increased ROM;Increased strength;Right Upper extremity;With written HEP provided Additional ADL Goal #1: Pt will be min guard A in and OOB for BADLs  Plan Discharge plan needs to be updated    Co-evaluation                 End of Session Equipment Utilized During Treatment: Oxygen    Activity Tolerance Patient limited by fatigue   Patient Left in bed;with call bell/phone within reach;with nursing/sitter in room;with family/visitor present   Nurse Communication Mobility status;Precautions;Weight bearing status        Time: 5885-0277 OT Time Calculation (min): 18 min  Charges: OT General Charges $OT Visit: 1 Procedure OT Treatments $Therapeutic Activity: 8-22 mins  Parks Czajkowski M 02/14/2015, 2:00 PM

## 2015-02-14 NOTE — Consult Note (Signed)
[facility] Medical Consultation  [name] [name] XKG:[id number] DOB: Mar 05, 1935 DOA: 02/11/2015 PCP: Georgann Housekeeper, MD   Requesting physician: Dr. Ranell Patrick Date of consultation: .date  Reason for consultation: medical management, fever   Impression/Recommendations Principal Problem:   S/P shoulder replacement Active Problems:   CAD S/P LAD/OM PCI '07, LAD ISR-DES 02/2012   Hypertension   Chronic renal insufficiency, stage III (moderate)   Acute respiratory failure with hypoxia   Rheumatoid arthritis   Acute diastolic heart failure   Severe sepsis   HCAP (healthcare-associated pneumonia)   Dysphagia   Effect of immunosuppressant therapy   Demand ischemia   80 y/o male with PMH of HTN, CAD h/oPCI (2011), CKD, Hypothyroidism, Rheumatoid Arthritis on prednisone and etanercept, Recent Pneumonia/Respiratory Failure (12/2014) who underwent Right shoulder reverse total shoulder arthroplasty on 6/24. Post op, patient developed acute respiratory failure transferred to ICU. He is on IV atx for sepsis due to probable pneumonia per PCCM. Patient also was evaluated and diuresed with IV lasix for fluid overload per cardiology in ICU.    1. Acute hypoxemic respiratory failure due to pneumonia on top of emphysema . Cont oxygen, bronchodilators prn, treat pneumonia. desat study prior to discharge    2. Pneumonia/sepsis. Febrile 6.828. CT chest: R lung infiltrate -we will cont IV atx. Febrile yesterday. Blood cultures: NGTD. Deescalate IV atx in 24-48 hrs if afebrile.  -we will obtain speech eval to r/o aspiration  3. CAD h/oPCI (2011). No chest pains. Trop pick at 0.12. Echo: LVEF 55-60%. no regional wall motionabnormalities -Cardiology followed, did not recommended interventions. Cont ASA, plavix, statins  4. Rheumatoid Arthritis on prednisone and etanercept.  5. Thrombocytopenia in the setting of sepsis and consumptive with recent surgery. No s/s of acute bleeding. Monitor  6. AKI on CKD III.  Creatinine improved  7. ? cirrhosis on CT scan. We wil obtain INR, LFTs, liver US     D/w patient, his wife, family    I will followup again tomorrow. Please contact me if I can be of assistance in the meanwhile. Thank you for this consultation.  Atx:  Zosyn 6/26>>> vanc 6/26>>>  Procedure: echoStudy Conclusions  - Procedure narrative: Transthoracic echocardiography. Image quality was adequate. The study was technically difficult. - Left ventricle: The cavity size was normal. There was mild focal basal hypertrophy of the septum. Systolic function was normal. The estimated ejection fraction was in the range of 55% to 60%. Wall motion was normal; there were no regional wall motion abnormalities. There was an increased relative contribution of atrial contraction to ventricular filling. Doppler parameters are consistent with abnormal left ventricular relaxation (grade 1 diastolic dysfunction). - Mitral valve: Calcified annulus.  Chief Complaint: post op Right shoulder reverse total shoulder arthroplasty on 6/24  HPI:  80 y/o male with PMH of HTN, CAD h/oPCI (2011), CKD, Hypothyroidism, Rheumatoid Arthritis on prednisone and etanercept, Recent Pneumonia, Respiratory Failure (12/2014) who underwent Right shoulder reverse total shoulder arthroplasty on 6/24. Post op, patient developed acute respiratory failure transferred to ICU. He is on IV atx for sepsis due to probable pneumonia per PCCM. Patient also was evaluated and diuresed with IV lasix per cardiology in ICU.   -patient is being transferred out of ICU. hospitalist is asked for help with management. Patient currently, in no distress. He reports some mild productive cough, no chest pains. No nausea, vomiting or diarrhea. He denies focal neuro weakness.    Review of Systems:  Review of Systems  Constitutional: Positive for fever, chills and malaise/fatigue.  HENT: Negative for congestion and nosebleeds.   Eyes:  Negative for blurred vision, double vision and photophobia.  Respiratory: Positive for cough, sputum production and shortness of breath. Negative for hemoptysis and stridor.   Cardiovascular: Negative for chest pain, palpitations, orthopnea and leg swelling.  Gastrointestinal: Negative for heartburn, vomiting and diarrhea.  Genitourinary: Negative for dysuria.  Musculoskeletal: Positive for joint pain. Negative for myalgias.  Skin: Negative for rash.  Neurological: Positive for dizziness and weakness. Negative for sensory change, focal weakness, seizures and headaches.  Psychiatric/Behavioral: Negative for depression and suicidal ideas.     Past Medical History  Diagnosis Date  . Rheumatoid arthritis(714.0)     chronic steroids  . High cholesterol     takes Simvastatin daily  . Pneumonia     "has had walking pneumonia twice"  . Exertional dyspnea 03/04/12    "last few months"  . Chronic kidney disease 03/04/12    "running ~ 40%"  . Umbilical hernia   . Hypothyroidism     takes SYnthroid daily  . Hypertension     takes Amlodipine and Imdur daily  . Coronary artery disease     takes Plavix daily  . GERD (gastroesophageal reflux disease)     takes Zantac daily  . Emphysema, interstitial 02/11/15    on CT  . Cirrhosis 02/11/15    on CT  . Diastolic CHF 02/11/15    after shoulder surgery   Past Surgical History  Procedure Laterality Date  . Coronary angioplasty with stent placement  2007; 2010    "2; 1"  . Coronary angioplasty with stent placement  03/04/12    "2; total of 5 now"  . Tonsillectomy  1935  . Appendectomy  1956  . Total knee arthroplasty  2000    bilaterally  . Total hip arthroplasty      bilaterally  . Revision total hip arthroplasty      right; "4 times; it jumped out and had to put it back in twice; redid it @ least once""  . Total shoulder arthroplasty      left  . Foot surgery      "both; arthritis; cut ~ all the bones in my feet were crooked; couldn't  walk"  . Cataract extraction w/ intraocular lens  implant, bilateral    . Left heart catheterization with coronary angiogram N/A 03/04/2012    Procedure: LEFT HEART CATHETERIZATION WITH CORONARY ANGIOGRAM;  Surgeon: Corky Crafts, MD;  Location: [facility] CATH LAB;  Service: Cardiovascular;  Laterality: N/A;  . Joint replacement    . Reverse shoulder arthroplasty Right 02/02/2015    Procedure: RIGHT REVERSE SHOULDER TOTAL ARTHROPLASTY;  Surgeon: Beverely Low, MD;  Location: [facility] OR;  Service: Orthopedics;  Laterality: Right;   Social History:  reports that he quit smoking about 28 years ago. His smoking use included Cigarettes. He has a 76 pack-year smoking history. He has never used smokeless tobacco. He reports that he does not drink alcohol or use illicit drugs.  Allergies  Allergen Reactions  . Aspirin Swelling    Only high doses of aspirin (given for arthritis).; "swelled face, lips, etc"  . Shellfish Allergy Anaphylaxis, Nausea And Vomiting and Swelling  . Lisinopril Cough  . Pantoprazole Other (See Comments)    dizzy   Family History  Problem Relation Age of Onset  . Heart disease Father   . Heart disease Brother   . Heart disease Brother   . Arthritis Mother     Prior to Admission  medications   Medication Sig Start Date End Date Taking? Authorizing Provider  acetaminophen (TYLENOL) 650 MG CR tablet Take 650 mg by mouth 2 (two) times daily as needed for pain.   Yes Historical Provider, MD  amLODipine (NORVASC) 2.5 MG tablet Take 1 tablet (2.5 mg total) by mouth daily. Patient taking differently: Take 2.5 mg by mouth daily after supper.  05/04/14  Yes Corky Crafts, MD  aspirin EC 81 MG tablet Take 81 mg by mouth at bedtime.   Yes Historical Provider, MD  clopidogrel (PLAVIX) 75 MG tablet Take 75 mg by mouth daily after supper.    Yes Historical Provider, MD  Etanercept 25 MG/0.5ML SOSY Inject 25 mg into the skin 2 (two) times a week. Wednesday or Thursday and Saturday or  Sunday (ENBREL)   Yes Historical Provider, MD  fish oil-omega-3 fatty acids 1000 MG capsule Take 1 g by mouth daily with supper.    Yes Historical Provider, MD  guaiFENesin (MUCINEX) 600 MG 12 hr tablet Take 1 tablet (600 mg total) by mouth 2 (two) times daily. 01/08/15  Yes Vassie Loll, MD  Ipratropium-Albuterol (COMBIVENT RESPIMAT) 20-100 MCG/ACT AERS respimat Inhale 1 puff into the lungs every 6 (six) hours as needed for wheezing or shortness of breath. 01/08/15  Yes Vassie Loll, MD  isosorbide mononitrate (IMDUR) 30 MG 24 hr tablet Take 1 tablet (30 mg total) by mouth daily. Patient taking differently: Take 30 mg by mouth daily after supper.  10/31/14  Yes Corky Crafts, MD  levothyroxine (SYNTHROID, LEVOTHROID) 100 MCG tablet Take 100 mcg by mouth daily before breakfast.   Yes Historical Provider, MD  Multiple Vitamin (MULITIVITAMIN WITH MINERALS) TABS Take 1 tablet by mouth daily.   Yes Historical Provider, MD  nitroGLYCERIN (NITROSTAT) 0.4 MG SL tablet Place 1 tablet (0.4 mg total) under the tongue every 5 (five) minutes x 3 doses as needed. For chest pain. Patient taking differently: Place 0.4 mg under the tongue every 5 (five) minutes x 3 doses as needed for chest pain.  01/31/14  Yes Corky Crafts, MD  predniSONE (DELTASONE) 5 MG tablet Take 1 tablet (5 mg total) by mouth daily with breakfast. To be started after tapering dose achieved. 01/08/15  Yes Vassie Loll, MD  ranitidine (ZANTAC) 150 MG tablet Take 150 mg by mouth daily.    Yes Historical Provider, MD  simvastatin (ZOCOR) 40 MG tablet Take 40 mg by mouth at bedtime.    Yes Historical Provider, MD  HYDROcodone-acetaminophen (NORCO) 5-325 MG per tablet Take 1 tablet by mouth every 6 (six) hours as needed for moderate pain or severe pain. 28-Feb-2015   Beverely Low, MD  levofloxacin (LEVAQUIN) 750 MG tablet Take 1 tablet (750 mg total) by mouth every other day. Patient not taking: Reported on 02/01/2015 01/08/15   Vassie Loll,  MD  predniSONE (DELTASONE) 20 MG tablet Take 2 tablet by mouth daily X 2 days; then 1 tablet by mouth daily X 3 days; then 1/2 tablet by mouth daily X 3 days and then resume your prednisone 5mg  daily as yo were taking before. Patient not taking: Reported on 02/01/2015 01/08/15   Vassie Loll, MD  traMADol (ULTRAM) 50 MG tablet Take 1-2 tablets (50-100 mg total) by mouth every 6 (six) hours as needed for moderate pain. Feb 28, 2015   Beverely Low, MD   Physical Exam: Blood pressure 130/97, pulse 107, temperature 97.7 F (36.5 C), temperature source Oral, resp. rate 17, height 5\' 6"  (1.676 m), weight 61.5  kg (135 lb 9.3 oz), SpO2 96 %. Filed Vitals:   02/14/15 1600  BP: 130/97  Pulse: 107  Temp:   Resp: 17     General:  Alert, confused to time   Eyes: eom-i, perrla   ENT: no oral ulcers   Neck: supple, no JVD  Cardiovascular: s1,s2 rrr  Respiratory: few crackles LL  Abdomen: soft, nt,nd   Skin: no rash   Musculoskeletal: R shoulder mild post op tender   Psychiatric: episode of delirium resolved   Neurologic: CN 2-12 intact. Motor 5/5 BL   Labs on Admission:  Basic Metabolic Panel:  Recent Labs Lab 02/10/15 2207 02/11/15 0528 02/12/15 0955 02/13/15 0250 02/14/15 0233  NA 132* 133* 133* 134* 137  K 4.1 3.7 3.6 3.4* 3.7  CL 98* 102 101 100* 101  CO2 22 22 22 23 25   GLUCOSE 113* 110* 116* 96 113*  BUN 35* 31* 24* 25* 25*  CREATININE 2.35* 2.10* 1.83* 1.84* 1.57*  CALCIUM 7.9* 7.3* 7.4* 7.1* 7.3*  MG  --  1.6*  --  2.4 2.2  PHOS  --  3.9  --  2.8 2.8   Liver Function Tests: No results for input(s): AST, ALT, ALKPHOS, BILITOT, PROT, ALBUMIN in the last 168 hours. No results for input(s): LIPASE, AMYLASE in the last 168 hours. No results for input(s): AMMONIA in the last 168 hours. CBC:  Recent Labs Lab 02/10/15 2207 02/11/15 0528 02/12/15 0955 02/13/15 0250 02/14/15 0233  WBC 10.6* 9.3 8.9 10.9* 14.7*  NEUTROABS 7.5  --   --   --   --   HGB 12.2* 10.4*  10.3* 9.8* 9.4*  HCT 37.9* 32.2* 31.9* 30.6* 29.0*  MCV 84.6 84.1 82.9 84.3 82.4  PLT 111* 100* 93* 77* 65*   Cardiac Enzymes:  Recent Labs Lab 03/03/2015 1926 02/10/15 0050 02/10/15 0730 02/10/15 0911 02/10/15 1455 02/10/15 2119  CKTOTAL 168  --   --   --   --   --   CKMB 4.3  --   --   --   --   --   TROPONINI 0.04* 0.08* 0.11* 0.12* 0.08* 0.08*   BNP: Invalid input(s): POCBNP CBG:  Recent Labs Lab 02/11/15 0335 02/11/15 0812  GLUCAP 107* 98    Radiological Exams on Admission: Dg Chest Port 1 View  02/13/2015   CLINICAL DATA:  Bullous emphysema, coronary artery disease  EXAM: PORTABLE CHEST - 1 VIEW  COMPARISON:  CT scan of the chest dated February 12, 2015  FINDINGS: The lungs are adequately inflated. The interstitial markings are increased diffusely greatest in the mid and lower lung zones. There is no significant pleural effusion. The cardiac silhouette is mildly enlarged. The central pulmonary vascularity is prominent. There is mural calcification in the aortic arch and wall of the descending thoracic aorta. The bony thorax exhibits no acute abnormality. There are bilateral shoulder prostheses.  IMPRESSION: Known emphysema and interstitial lung disease without focal pneumonia. Mild enlargement of the cardiac silhouette without pulmonary edema.   Electronically Signed   By: David  February 14, 2015 M.D.   On: 02/13/2015 07:12    EKG: Independently reviewed.   Time spent: >45 minutes   02/15/2015 [facility] Pager [phone]  If 7PM-7AM, please contact night-coverage [email or web address] Password [email or web address] 02/14/2015, 5:10 PM

## 2015-02-15 ENCOUNTER — Inpatient Hospital Stay (HOSPITAL_COMMUNITY): Payer: Medicare Other

## 2015-02-15 DIAGNOSIS — R410 Disorientation, unspecified: Secondary | ICD-10-CM

## 2015-02-15 DIAGNOSIS — Z96611 Presence of right artificial shoulder joint: Secondary | ICD-10-CM

## 2015-02-15 DIAGNOSIS — I471 Supraventricular tachycardia: Secondary | ICD-10-CM

## 2015-02-15 DIAGNOSIS — R000 Tachycardia, unspecified: Secondary | ICD-10-CM | POA: Clinically undetermined

## 2015-02-15 DIAGNOSIS — I10 Essential (primary) hypertension: Secondary | ICD-10-CM

## 2015-02-15 LAB — COMPREHENSIVE METABOLIC PANEL
ALBUMIN: 2.3 g/dL — AB (ref 3.5–5.0)
ALK PHOS: 95 U/L (ref 38–126)
ALT: 14 U/L — ABNORMAL LOW (ref 17–63)
AST: 77 U/L — ABNORMAL HIGH (ref 15–41)
Anion gap: 7 (ref 5–15)
BUN: 24 mg/dL — AB (ref 6–20)
CALCIUM: 7.6 mg/dL — AB (ref 8.9–10.3)
CO2: 24 mmol/L (ref 22–32)
CREATININE: 1.42 mg/dL — AB (ref 0.61–1.24)
Chloride: 106 mmol/L (ref 101–111)
GFR calc Af Amer: 53 mL/min — ABNORMAL LOW (ref >60)
GFR calc non Af Amer: 46 mL/min — ABNORMAL LOW (ref >60)
Glucose, Bld: 135 mg/dL — ABNORMAL HIGH (ref 65–99)
Potassium: 3.4 mmol/L — ABNORMAL LOW (ref 3.5–5.1)
SODIUM: 137 mmol/L (ref 135–145)
Total Bilirubin: 1.9 mg/dL — ABNORMAL HIGH (ref 0.3–1.2)
Total Protein: 6.2 g/dL — ABNORMAL LOW (ref 6.5–8.1)

## 2015-02-15 LAB — CBC
HEMATOCRIT: 31.4 % — AB (ref 39.0–52.0)
HEMOGLOBIN: 10.1 g/dL — AB (ref 13.0–17.0)
MCH: 26.2 pg (ref 26.0–34.0)
MCHC: 32.2 g/dL (ref 30.0–36.0)
MCV: 81.6 fL (ref 78.0–100.0)
PLATELETS: 87 10*3/uL — AB (ref 150–400)
RBC: 3.85 MIL/uL — AB (ref 4.22–5.81)
RDW: 18.2 % — ABNORMAL HIGH (ref 11.5–15.5)
WBC: 14 10*3/uL — ABNORMAL HIGH (ref 4.0–10.5)

## 2015-02-15 LAB — BLOOD GAS, ARTERIAL
Acid-Base Excess: 0.2 mmol/L (ref 0.0–2.0)
Bicarbonate: 23.3 mEq/L (ref 20.0–24.0)
Drawn by: 281201
FIO2: 0.5 %
O2 SAT: 94.7 %
PO2 ART: 70.9 mmHg — AB (ref 80.0–100.0)
Patient temperature: 98.6
TCO2: 24.2 mmol/L (ref 0–100)
pCO2 arterial: 31 mmHg — ABNORMAL LOW (ref 35.0–45.0)
pH, Arterial: 7.488 — ABNORMAL HIGH (ref 7.350–7.450)

## 2015-02-15 LAB — TROPONIN I
TROPONIN I: 0.18 ng/mL — AB (ref <0.031)
Troponin I: 0.21 ng/mL — ABNORMAL HIGH (ref <0.031)

## 2015-02-15 LAB — TSH: TSH: 1.142 u[IU]/mL (ref 0.350–4.500)

## 2015-02-15 LAB — MRSA PCR SCREENING: MRSA BY PCR: NEGATIVE

## 2015-02-15 LAB — PHOSPHORUS: PHOSPHORUS: 1.7 mg/dL — AB (ref 2.5–4.6)

## 2015-02-15 LAB — MAGNESIUM: Magnesium: 1.9 mg/dL (ref 1.7–2.4)

## 2015-02-15 MED ORDER — LORAZEPAM 2 MG/ML IJ SOLN
0.5000 mg | Freq: Three times a day (TID) | INTRAMUSCULAR | Status: DC | PRN
  Administered 2015-02-15 – 2015-02-16 (×3): 0.5 mg via INTRAVENOUS
  Filled 2015-02-15 (×3): qty 1

## 2015-02-15 MED ORDER — IPRATROPIUM BROMIDE 0.02 % IN SOLN: 0.5000 mg | Freq: Four times a day (QID) | RESPIRATORY_TRACT | Status: DC

## 2015-02-15 MED ORDER — POTASSIUM PHOSPHATES 15 MMOLE/5ML IV SOLN
20.0000 mmol | Freq: Once | INTRAVENOUS | Status: AC
  Administered 2015-02-15: 20 mmol via INTRAVENOUS
  Filled 2015-02-15: qty 6.67

## 2015-02-15 MED ORDER — TRAZODONE HCL 50 MG PO TABS
75.0000 mg | ORAL_TABLET | Freq: Every evening | ORAL | Status: DC | PRN
  Filled 2015-02-15: qty 1

## 2015-02-15 MED ORDER — IPRATROPIUM BROMIDE 0.02 % IN SOLN
0.5000 mg | Freq: Four times a day (QID) | RESPIRATORY_TRACT | Status: DC
  Administered 2015-02-15 – 2015-02-17 (×6): 0.5 mg via RESPIRATORY_TRACT
  Filled 2015-02-15 (×6): qty 2.5

## 2015-02-15 MED ORDER — LEVALBUTEROL HCL 0.63 MG/3ML IN NEBU
0.6300 mg | INHALATION_SOLUTION | RESPIRATORY_TRACT | Status: DC | PRN
  Filled 2015-02-15: qty 3

## 2015-02-15 MED ORDER — LEVALBUTEROL HCL 0.63 MG/3ML IN NEBU
0.6300 mg | INHALATION_SOLUTION | Freq: Four times a day (QID) | RESPIRATORY_TRACT | Status: DC
  Filled 2015-02-15 (×4): qty 3

## 2015-02-15 MED ORDER — VANCOMYCIN HCL IN DEXTROSE 1-5 GM/200ML-% IV SOLN
1000.0000 mg | INTRAVENOUS | Status: DC
  Administered 2015-02-16 – 2015-02-17 (×2): 1000 mg via INTRAVENOUS
  Filled 2015-02-15 (×3): qty 200

## 2015-02-15 MED ORDER — LEVALBUTEROL HCL 0.63 MG/3ML IN NEBU
0.6300 mg | INHALATION_SOLUTION | Freq: Four times a day (QID) | RESPIRATORY_TRACT | Status: DC
  Administered 2015-02-15 – 2015-02-17 (×6): 0.63 mg via RESPIRATORY_TRACT
  Filled 2015-02-15 (×14): qty 3

## 2015-02-15 MED ORDER — FUROSEMIDE 10 MG/ML IJ SOLN
40.0000 mg | Freq: Once | INTRAMUSCULAR | Status: AC
  Administered 2015-02-15: 40 mg via INTRAVENOUS
  Filled 2015-02-15: qty 4

## 2015-02-15 MED ORDER — MORPHINE SULFATE 2 MG/ML IJ SOLN
2.0000 mg | INTRAMUSCULAR | Status: DC | PRN
  Administered 2015-02-15 – 2015-02-16 (×3): 2 mg via INTRAVENOUS
  Filled 2015-02-15 (×3): qty 1

## 2015-02-15 MED ORDER — VANCOMYCIN HCL IN DEXTROSE 1-5 GM/200ML-% IV SOLN
1000.0000 mg | INTRAVENOUS | Status: DC
  Filled 2015-02-15: qty 200

## 2015-02-15 MED ORDER — POTASSIUM CHLORIDE CRYS ER 20 MEQ PO TBCR
40.0000 meq | EXTENDED_RELEASE_TABLET | Freq: Once | ORAL | Status: AC
  Administered 2015-02-15: 40 meq via ORAL
  Filled 2015-02-15: qty 2

## 2015-02-15 MED ORDER — QUETIAPINE FUMARATE 25 MG PO TABS
25.0000 mg | ORAL_TABLET | Freq: Every day | ORAL | Status: DC
  Filled 2015-02-15: qty 1

## 2015-02-15 MED ORDER — IPRATROPIUM BROMIDE 0.02 % IN SOLN
0.5000 mg | RESPIRATORY_TRACT | Status: DC | PRN
Start: 2015-02-15 — End: 2015-02-17

## 2015-02-15 NOTE — Progress Notes (Signed)
OT Cancellation Note  Patient Details Name: Xayvion Shirah MRN: [id number] DOB: December 12, 1934   Cancelled Treatment:    Reason Eval/Treat Not Completed: Medical issues which prohibited therapy;Other (comment) (Pt being transferred to ICU.)  Pilar Grammes 02/15/2015, 1:02 PM

## 2015-02-15 NOTE — Progress Notes (Signed)
Rehab admissions - Please see rehab consult done this am by Dr. Riley Kill recommending SNF placement.  Case manager is aware of need for SNF once patient is medically ready.  I will sign off at this point for inpatient rehab admissions.  Call me for questions.  #[phone]

## 2015-02-15 NOTE — Progress Notes (Signed)
Called per floor RN at [phone] regarding Pt with sudden SOB, chest pain, and severe confusion. Per RN Pt upset, refusing NTG, BP elevated. Prior to my arrival RN advised to give haldol, call Cardiology, and place on NRB for 02 sats 80% on 6 LNC. Upon my arrival Pt found calm resting in bed on NRB 02 sats 100%. Pt denies pain. Lung sound clear, minor wheeze heard in upper airway assessment. EKG completed. Cardiology paged and updated on Pt status, MD to review EKG. No orders received per Cardiologist. RT en route for NEB treatment. Patient weaned down to 50% VM. Advised RN to update Pt Primary MD, monitor Pt closely and notify myself and Provider for worsening changes.

## 2015-02-15 NOTE — Progress Notes (Signed)
Patient transferred to 2C16 from 5N01. Sitter/Wife at bedside.

## 2015-02-15 NOTE — Progress Notes (Signed)
RT talk with Dr Ramiro Harvest about pt resting comfortably on 55%VM sats 100.  Per MD to cancel HFNC for now due to the pts sats being stable and pt is resting comfortably on VM.  RT will wean FIO2 as tol when pt wakes up.  Per wife pt has not slept at all.  RN aware

## 2015-02-15 NOTE — Progress Notes (Signed)
SLP Cancellation Note  Patient Details Name: [name] [name] MRN: [id number] DOB: December 03, 1934   Cancelled treatment:       Reason Eval/Treat Not Completed: Patient at procedure or test/unavailable. Pt is NPO for a procedure. Will f/u when pt able to participate.    Prinston Kynard, Riley Nearing 02/15/2015, 9:28 AM

## 2015-02-15 NOTE — Consult Note (Signed)
Physical Medicine and Rehabilitation Consult Reason for Consult: Right shoulder reverse total shoulder arthroplasty/respiratory failure Referring Physician: Dr. Ranell Patrick   HPI: [name] [name] is a 80 y.o. right handed male with history of hypertension, diastolic congestive heart failure, CAD chronic kidney disease with baseline creatinine 2.10, rheumatoid arthritis with chronic steroids and multiple joint replacements. Presented 02/12/2015 with progressive right shoulder pain and dysfunction secondary to rotator cuff arthropathy. Underwent right shoulder reverse total shoulder arthroplasty 02/10/2015 per Dr. Ranell Patrick. Nonweightbearing right upper extremity. Hospital course pain management as well as hypoxic respiratory failure and transferred to ICU. Maintained on broad-spectrum anti-biotics. Lung perfusion study showed no signs of pulmonary embolism. Chest x-ray without focal pneumonia or pulmonary edema. Echocardiogram with ejection fraction of 60% grade 1 diastolic dysfunction. Venous Doppler studies lower extremities negative DVT. Bedside swallow evaluation maintained on a regular diet. Bouts of confusion patient did receive Haldol and a sitter at bedside. Occupational therapy evaluation completed awaiting physical therapy evaluation. Recommendations made for physical medicine rehabilitation consult.   Review of Systems  Unable to perform ROS: mental acuity   Past Medical History  Diagnosis Date  . Rheumatoid arthritis(714.0)     chronic steroids  . High cholesterol     takes Simvastatin daily  . Pneumonia     "has had walking pneumonia twice"  . Exertional dyspnea 03/04/12    "last few months"  . Chronic kidney disease 03/04/12    "running ~ 40%"  . Umbilical hernia   . Hypothyroidism     takes SYnthroid daily  . Hypertension     takes Amlodipine and Imdur daily  . Coronary artery disease     takes Plavix daily  . GERD (gastroesophageal reflux disease)     takes Zantac daily    . Emphysema, interstitial 02/11/15    on CT  . Cirrhosis 02/11/15    on CT  . Diastolic CHF 02/11/15    after shoulder surgery   Past Surgical History  Procedure Laterality Date  . Coronary angioplasty with stent placement  2007; 2010    "2; 1"  . Coronary angioplasty with stent placement  03/04/12    "2; total of 5 now"  . Tonsillectomy  1935  . Appendectomy  1956  . Total knee arthroplasty  2000    bilaterally  . Total hip arthroplasty      bilaterally  . Revision total hip arthroplasty      right; "4 times; it jumped out and had to put it back in twice; redid it @ least once""  . Total shoulder arthroplasty      left  . Foot surgery      "both; arthritis; cut ~ all the bones in my feet were crooked; couldn't walk"  . Cataract extraction w/ intraocular lens  implant, bilateral    . Left heart catheterization with coronary angiogram N/A 03/04/2012    Procedure: LEFT HEART CATHETERIZATION WITH CORONARY ANGIOGRAM;  Surgeon: Corky Crafts, MD;  Location: [facility] CATH LAB;  Service: Cardiovascular;  Laterality: N/A;  . Joint replacement    . Reverse shoulder arthroplasty Right 01/26/2015    Procedure: RIGHT REVERSE SHOULDER TOTAL ARTHROPLASTY;  Surgeon: Beverely Low, MD;  Location: [facility] OR;  Service: Orthopedics;  Laterality: Right;   Family History  Problem Relation Age of Onset  . Heart disease Father   . Heart disease Brother   . Heart disease Brother   . Arthritis Mother    Social History:  reports that he  quit smoking about 28 years ago. His smoking use included Cigarettes. He has a 76 pack-year smoking history. He has never used smokeless tobacco. He reports that he does not drink alcohol or use illicit drugs. Allergies:  Allergies  Allergen Reactions  . Aspirin Swelling    Only high doses of aspirin (given for arthritis).; "swelled face, lips, etc"  . Shellfish Allergy Anaphylaxis, Nausea And Vomiting and Swelling  . Lisinopril Cough  . Pantoprazole Other (See Comments)     dizzy   Medications Prior to Admission  Medication Sig Dispense Refill  . acetaminophen (TYLENOL) 650 MG CR tablet Take 650 mg by mouth 2 (two) times daily as needed for pain.    Marland Kitchen amLODipine (NORVASC) 2.5 MG tablet Take 1 tablet (2.5 mg total) by mouth daily. (Patient taking differently: Take 2.5 mg by mouth daily after supper. ) 90 tablet 3  . aspirin EC 81 MG tablet Take 81 mg by mouth at bedtime.    . clopidogrel (PLAVIX) 75 MG tablet Take 75 mg by mouth daily after supper.     . Etanercept 25 MG/0.5ML SOSY Inject 25 mg into the skin 2 (two) times a week. Wednesday or Thursday and Saturday or Sunday (ENBREL)    . fish oil-omega-3 fatty acids 1000 MG capsule Take 1 g by mouth daily with supper.     Marland Kitchen guaiFENesin (MUCINEX) 600 MG 12 hr tablet Take 1 tablet (600 mg total) by mouth 2 (two) times daily. 20 tablet 0  . Ipratropium-Albuterol (COMBIVENT RESPIMAT) 20-100 MCG/ACT AERS respimat Inhale 1 puff into the lungs every 6 (six) hours as needed for wheezing or shortness of breath. 1 Inhaler 0  . isosorbide mononitrate (IMDUR) 30 MG 24 hr tablet Take 1 tablet (30 mg total) by mouth daily. (Patient taking differently: Take 30 mg by mouth daily after supper. ) 90 tablet 3  . levothyroxine (SYNTHROID, LEVOTHROID) 100 MCG tablet Take 100 mcg by mouth daily before breakfast.    . Multiple Vitamin (MULITIVITAMIN WITH MINERALS) TABS Take 1 tablet by mouth daily.    . nitroGLYCERIN (NITROSTAT) 0.4 MG SL tablet Place 1 tablet (0.4 mg total) under the tongue every 5 (five) minutes x 3 doses as needed. For chest pain. (Patient taking differently: Place 0.4 mg under the tongue every 5 (five) minutes x 3 doses as needed for chest pain. ) 25 tablet 5  . predniSONE (DELTASONE) 5 MG tablet Take 1 tablet (5 mg total) by mouth daily with breakfast. To be started after tapering dose achieved.    . ranitidine (ZANTAC) 150 MG tablet Take 150 mg by mouth daily.     . simvastatin (ZOCOR) 40 MG tablet Take 40 mg by  mouth at bedtime.     Marland Kitchen levofloxacin (LEVAQUIN) 750 MG tablet Take 1 tablet (750 mg total) by mouth every other day. (Patient not taking: Reported on 02/01/2015) 4 tablet 0  . predniSONE (DELTASONE) 20 MG tablet Take 2 tablet by mouth daily X 2 days; then 1 tablet by mouth daily X 3 days; then 1/2 tablet by mouth daily X 3 days and then resume your prednisone  daily as yo were taking before. (Patient not taking: Reported on 02/01/2015) 10 tablet 0    Home: Home Living Family/patient expects to be discharged to:: Skilled nursing facility Living Arrangements: Spouse/significant other  Functional History: Prior Function Level of Independence: Needs assistance ADL's / Homemaking Assistance Needed: Sometimes used A SPC Communication / Swallowing Assistance Needed: Wife A prn due to bad  RA in hands Functional Status:  Mobility: Bed Mobility Overal bed mobility: Needs Assistance Bed Mobility: Supine to Sit Rolling: Mod assist Sidelying to sit: Max assist General bed mobility comments: max verbal cues for sequencing and assist to move LEs off EOB and to lift shoulders  Transfers Overall transfer level: Needs assistance Equipment used: 1 person hand held assist Transfers: Sit to/from Stand, Stand Pivot Transfers Sit to Stand: Mod assist Stand pivot transfers: Mod assist      ADL: ADL Overall ADL's : Needs assistance/impaired Eating/Feeding: Maximal assistance, Bed level, Sitting Grooming: Wash/dry hands, Wash/dry face, Brushing hair, Maximal assistance, Sitting, Bed level Upper Body Bathing: Maximal assistance, Sitting, Bed level Lower Body Bathing: Total assistance, Bed level, Sit to/from stand Upper Body Dressing : Total assistance, Sitting Lower Body Dressing: Total assistance, Sit to/from stand Toilet Transfer: Moderate assistance, Stand-pivot, BSC Toileting- Clothing Manipulation and Hygiene: Total assistance, Sit to/from stand Functional mobility during ADLs: Moderate  assistance General ADL Comments: Pt limited due to impaired cognition and level of fatigue due to not sleeping  He is now deconditioned due to events of last few days   Cognition: Cognition Overall Cognitive Status: Impaired/Different from baseline Orientation Level: Oriented X4 Cognition Arousal/Alertness: Lethargic, Suspect due to medications Behavior During Therapy: Restless Overall Cognitive Status: Impaired/Different from baseline Area of Impairment: Orientation, Attention, Following commands, Safety/judgement, Awareness, Problem solving, Memory Orientation Level: Disoriented to, Place, Time, Situation Current Attention Level: Focused Memory: Decreased recall of precautions, Decreased short-term memory Following Commands: Follows one step commands with increased time Safety/Judgement: Decreased awareness of safety, Decreased awareness of deficits Problem Solving: Slow processing, Difficulty sequencing, Requires verbal cues General Comments: Pt very restless, and delerious.  Per RN, pt has not slept in 2 days  Blood pressure 162/68, pulse 99, temperature 97.4 F (36.3 C), temperature source Oral, resp. rate 18, height 5\' 6"  (1.676 m), weight 61.5 kg (135 lb 9.3 oz), SpO2 100 %. Physical Exam  Constitutional:  80 year old frail Caucasian male.  HENT:  Head: Normocephalic.  Eyes: EOM are normal.  Neck: Normal range of motion. Neck supple. No thyromegaly present.  Cardiovascular: Normal rate and regular rhythm.   Respiratory:  Decreased breath sounds at the bases  GI: Soft. Bowel sounds are normal. He exhibits no distension.  Musculoskeletal:  Right shoulder tender/dressed. RA deformities noted in hands/ feet  Neurological:  Patient is alert and anxious. There is a Comptroller at bedside for patient safety. He was able to provide his name and age but very limited awareness of his deficits. Follows simple commands. Moves all 4's. No gross sensory deficits  Psychiatric:  Pleasantly  confused    Results for orders placed or performed during the hospital encounter of Feb 16, 2015 (from the past 24 hour(s))  Protime-INR     Status: None   Collection Time: 02/14/15  7:44 PM  Result Value Ref Range   Prothrombin Time 15.0 11.6 - 15.2 seconds   INR 1.16 0.00 - 1.49  CBC     Status: Abnormal (Preliminary result)   Collection Time: 02/15/15  5:12 AM  Result Value Ref Range   WBC 14.0 (H) 4.0 - 10.5 K/uL   RBC 3.85 (L) 4.22 - 5.81 MIL/uL   Hemoglobin 10.1 (L) 13.0 - 17.0 g/dL   HCT 40.9 (L) 81.1 - 91.4 %   MCV 81.6 78.0 - 100.0 fL   MCH 26.2 26.0 - 34.0 pg   MCHC 32.2 30.0 - 36.0 g/dL   RDW 78.2 (H) 95.6 - 21.3 %  Platelets PENDING 150 - 400 K/uL   Dg Chest Port 1 View  02/13/2015   CLINICAL DATA:  Bullous emphysema, coronary artery disease  EXAM: PORTABLE CHEST - 1 VIEW  COMPARISON:  CT scan of the chest dated February 12, 2015  FINDINGS: The lungs are adequately inflated. The interstitial markings are increased diffusely greatest in the mid and lower lung zones. There is no significant pleural effusion. The cardiac silhouette is mildly enlarged. The central pulmonary vascularity is prominent. There is mural calcification in the aortic arch and wall of the descending thoracic aorta. The bony thorax exhibits no acute abnormality. There are bilateral shoulder prostheses.  IMPRESSION: Known emphysema and interstitial lung disease without focal pneumonia. Mild enlargement of the cardiac silhouette without pulmonary edema.   Electronically Signed   By: David  [place] M.D.   On: 02/13/2015 07:12    Assessment/Plan: Diagnosis: RA s/p right TSA with post-op complications 1. Does the need for close, 24 hr/day medical supervision in concert with the patient's rehab needs make it unreasonable for this patient to be served in a less intensive setting? No and Potentially 2. Co-Morbidities requiring supervision/potential complications: htn, CRI, pneumonia, AMS 3. Due to bladder management,  bowel management, safety, skin/wound care, disease management, medication administration, pain management and patient education, does the patient require 24 hr/day rehab nursing? No and Potentially 4. Does the patient require coordinated care of a physician, rehab nurse, PT (1-2 hrs/day, 5 days/week) and OT (1-2 hrs/day, 5 days/week) to address physical and functional deficits in the context of the above medical diagnosis(es)? No and Potentially Addressing deficits in the following areas: balance, endurance, locomotion, strength, transferring, bowel/bladder control, bathing, dressing, feeding, grooming, toileting and psychosocial support 5. Can the patient actively participate in an intensive therapy program of at least 3 hrs of therapy per day at least 5 days per week? No 6. The potential for patient to make measurable gains while on inpatient rehab is fair 7. Anticipated functional outcomes upon discharge from inpatient rehab are n/a  with PT, n/a with OT, n/a with SLP. 8. Estimated rehab length of stay to reach the above functional goals is: n/a 9. Does the patient have adequate social supports and living environment to accommodate these discharge functional goals? Potentially 10. Anticipated D/C setting: Other 11. Anticipated post D/C treatments: HH therapy and Outpatient therapy 12. Overall Rehab/Functional Prognosis: fair  RECOMMENDATIONS: This patient's condition is appropriate for continued rehabilitative care in the following setting: SNF Patient has agreed to participate in recommended program. N/A Note that insurance prior authorization may be required for reimbursement for recommended care.  Comment: Pt with substantial confusion. Has long rehab course of ahead of him as it pertains to his right shoulder.   Ranelle Oyster, MD, [facility] Physical Medicine & Rehabilitation 02/15/2015     02/15/2015

## 2015-02-15 NOTE — Progress Notes (Signed)
PT Cancellation Note  Patient Details Name: Jetson Pickrel MRN: [id number] DOB: 1934/12/17   Cancelled Treatment:    Reason Eval/Treat Not Completed: Medical issues which prohibited therapy (RN reports concern for pt's stability).  Pt's HR has been in the 150's overnight per RN and w/ venturi mask at 4L 02 levels of 94.  Additionally, RN reports pt was combative overnight.  PT will continue to follow acutely and will complete evaluation once pt medically stable.  Thank you for this referral.   Michail Jewels PT, DPT [phone] Pager: [phone] 02/15/2015, 9:03 AM

## 2015-02-15 NOTE — Progress Notes (Signed)
[facility]opaedic office contacted concerning patient's stability. Per the night nurse Roni, RN, the patient's HR had been in the 150's from 0300 until now. Patient has on a venturi mask at 4L with 02 of 94. BP 133/67. Rectal temp 98.9. The nurse feels the patient may require a higher level of care. Awaiting a call from Dr. Ranell Patrick or Dr. Dietrich Pates PA. Will continue to monitor patient.

## 2015-02-15 NOTE — Progress Notes (Signed)
ANTIBIOTIC CONSULT NOTE - Follow-up  Pharmacy Consult for Vancomycin + Zosyn Indication: pneumonia  Allergies  Allergen Reactions  . Aspirin Swelling    Only high doses of aspirin (given for arthritis).; "swelled face, lips, etc"  . Shellfish Allergy Anaphylaxis, Nausea And Vomiting and Swelling  . Lisinopril Cough  . Pantoprazole Other (See Comments)    dizzy    Patient Measurements: Height: 5\' 6"  (167.6 cm) Weight: 135 lb 9.3 oz (61.5 kg) IBW/kg (Calculated) : 63.8   Vital Signs: Temp: 98.9 F (37.2 C) (06/30 0821) Temp Source: Rectal (06/30 0821) BP: 133/67 mmHg (06/30 0821) Pulse Rate: 164 (06/30 1106) Intake/Output from previous day: 06/29 0701 - 06/30 0700 In: 680 [P.O.:480; IV Piggyback:200] Out: 300 [Urine:300] Intake/Output from this shift: Total I/O In: -  Out: 200 [Urine:200]  Labs:  Recent Labs  02/13/15 0250 02/14/15 0233 02/15/15 0512  WBC 10.9* 14.7* 14.0*  HGB 9.8* 9.4* 10.1*  PLT 77* 65* 87*  CREATININE 1.84* 1.57* 1.42*   Estimated Creatinine Clearance: 37.3 mL/min (by C-G formula based on Cr of 1.42). No results for input(s): VANCOTROUGH, VANCOPEAK, VANCORANDOM, GENTTROUGH, GENTPEAK, GENTRANDOM, TOBRATROUGH, TOBRAPEAK, TOBRARND, AMIKACINPEAK, AMIKACINTROU, AMIKACIN in the last 72 hours.   Microbiology: Recent Results (from the past 720 hour(s))  Surgical pcr screen     Status: None   Collection Time: 02/01/15 10:45 AM  Result Value Ref Range Status   MRSA, PCR NEGATIVE NEGATIVE Final   Staphylococcus aureus NEGATIVE NEGATIVE Final    Comment:        The Xpert SA Assay (FDA approved for NASAL specimens in patients over 16 years of age), is one component of a comprehensive surveillance program.  Test performance has been validated by [facility] for patients greater than or equal to 22 year old. It is not intended to diagnose infection nor to guide or monitor treatment.   Culture, blood (routine x 2)     Status: None (Preliminary  result)   Collection Time: 02/10/15 10:07 PM  Result Value Ref Range Status   Specimen Description BLOOD LEFT ANTECUBITAL  Final   Special Requests BOTTLES DRAWN AEROBIC ONLY 1CC  Final   Culture NO GROWTH 3 DAYS  Final   Report Status PENDING  Incomplete  Culture, blood (routine x 2)     Status: None (Preliminary result)   Collection Time: 02/10/15 10:17 PM  Result Value Ref Range Status   Specimen Description BLOOD LEFT HAND  Final   Special Requests BOTTLES DRAWN AEROBIC ONLY 5CC  Final   Culture NO GROWTH 3 DAYS  Final   Report Status PENDING  Incomplete  Urine culture     Status: None   Collection Time: 02/11/15  9:44 AM  Result Value Ref Range Status   Specimen Description URINE, CLEAN CATCH  Final   Special Requests NONE  Final   Culture NO GROWTH 1 DAY  Final   Report Status 02/12/2015 FINAL  Final   Assessment: 80 YOM who continues on Vancomycin + Zosyn D#6 for HCAP. The patient is afebrile, WBC 14 << 14.7 (on prednisone), SCr 1.42 << 1.57, CrCl~30-40 ml/min. Due to the patient's improving renal function - will increase the Vancomycin dose today. .  6/25 Vanc>> 6/25 Zosyn>>  6/25 blood x2>>NGTD 6/26 urine>>NEG   Goal of Therapy:  Vancomycin trough level 15-20 mcg/ml  Plan:  1. Increase Vancomycin to 1g IV every 24 hours 2. Continue Zosyn 3.375g IV every 8 hours (infused over 4 hours) 3. Consider adding stop date  for 7/2 to complete a total of 8 days for HCAP 4. Will continue to follow renal function, culture results, LOT, and antibiotic de-escalation plans   Georgina Pillion, PharmD, BCPS Clinical Pharmacist Pager: [phone] 02/15/2015 1:03 PM

## 2015-02-15 NOTE — Significant Event (Signed)
Rapid Response Event Note  Overview:  Follow up on patient from last pm - now with increasing HR  Time Called: 1100 (follow up) Arrival Time: 1100 Event Type: Respiratory  Initial Focused Assessment:  Patient alert - confused time and place - recognizes wife - agitated at times - picking at gown, sheets and pulling O2 mask off - follows commands at times. Moving all extremities - bilateral hands with contractions from RA.    Face flushed - temp 98.9 rectal per staff - skin warm and dry.  Speech clear.  On Venturi mask with O2 set at 50% but on low flow at 4 liters - increased to 10L flow. Resps regular - slightly tachypnea with rate 20-24 - no accessory muscle use O2 sats 95%. Only endorses CP with DB - states "it feels like when I get pneumonia."  Does not radiate - no nausea.  Bil BS present - rhonchi noted bilaterally - fair cough - some fine crackles in bases posterior - no wheezing noted.  HR with intermittent tachycardia - looks like SVT with PVC's and PAC's will get 12 lead.  Right shoulder DDI.  Abd soft.  Wife presents - states patient has not slept much if at all in going on 3 days.  BP 135/72  HR 127.  Noted goal for O2 sat per PCCM is 88-92%.  Severe RA - have asked RN [place] to consider maximizing pain meds with shoulder surgical pain and RA.     Interventions:  Repositioned patient - 12 lead EKG - difficulty catching high rate - call to Dr. Janee Morn - had medicine consult yesterday - with updates and requested to come see patient.  Dr. Janee Morn to bedside - patient currently relaxed with HR 84.  Dr. Janee Morn examined patient - reviewed monitor for HR fluctuations.  PCXR ordered.  ABG and lab work ordered.  Attempted to decrease flow to O2 - patient became more agitated - returned to high flow with O2 at 50%.  Will request high flow nasal cannula - seemed to tolerate that in ICU with high flow but lower concentration.  Dr. Janee Morn aware.  With increased agitation - 0.5 mg Ativan IV given.   Patient resting better but remains confused.  ABG result reviewed by Dr. Janee Morn.  Orders for SDU transfer for closer observation.  Oral care done - mucosa very dry - is currently NPO for abd ultrasound.  BP 130/72  HR 109 RR 22 - O2 sats 98% on VM 50%.  Handoff to [place] RN - has bed on SDU - to call as needed.    Event Summary: Name of Physician Notified: Dr. Janee Morn at 1110    at    Outcome: Transferred (Comment)  Event End Time: 1300  [place]

## 2015-02-15 NOTE — Progress Notes (Signed)
RT at bedside with RN-wife states pt is resting finally for the first time.  Pt is on 55%VM sats 100.  Pt resting.  Wife states can we hold tx at this time due to patient resting.  Dr Ramiro Harvest paged to report this information.

## 2015-02-15 NOTE — Progress Notes (Signed)
MEDICATION RELATED CONSULT NOTE - INITIAL   Pharmacy Consult for Phosphorus replacement  Allergies  Allergen Reactions  . Aspirin Swelling    Only high doses of aspirin (given for arthritis).; "swelled face, lips, etc"  . Shellfish Allergy Anaphylaxis, Nausea And Vomiting and Swelling  . Lisinopril Cough  . Pantoprazole Other (See Comments)    dizzy    Patient Measurements: Height: 5\' 6"  (167.6 cm) Weight: 135 lb 9.3 oz (61.5 kg) IBW/kg (Calculated) : 63.8  Vital Signs: Temp: 97.4 F (36.3 C) (06/29 2052) BP: 162/68 mmHg (06/30 0500) Pulse Rate: 99 (06/30 0500) Intake/Output from previous day: 06/29 0701 - 06/30 0700 In: 680 [P.O.:480; IV Piggyback:200] Out: 300 [Urine:300] Intake/Output from this shift:    Labs:  Recent Labs  02/13/15 0250 02/14/15 0233 02/15/15 0512  WBC 10.9* 14.7* 14.0*  HGB 9.8* 9.4* 10.1*  HCT 30.6* 29.0* 31.4*  PLT 77* 65* PENDING  CREATININE 1.84* 1.57* 1.42*  MG 2.4 2.2 1.9  PHOS 2.8 2.8 1.7*  ALBUMIN  --   --  2.3*  PROT  --   --  6.2*  AST  --   --  77*  ALT  --   --  14*  ALKPHOS  --   --  95  BILITOT  --   --  1.9*   Estimated Creatinine Clearance: 37.3 mL/min (by C-G formula based on Cr of 1.42).   Microbiology: Recent Results (from the past 720 hour(s))  Surgical pcr screen     Status: None   Collection Time: 02/01/15 10:45 AM  Result Value Ref Range Status   MRSA, PCR NEGATIVE NEGATIVE Final   Staphylococcus aureus NEGATIVE NEGATIVE Final    Comment:        The Xpert SA Assay (FDA approved for NASAL specimens in patients over 56 years of age), is one component of a comprehensive surveillance program.  Test performance has been validated by [facility] for patients greater than or equal to 78 year old. It is not intended to diagnose infection nor to guide or monitor treatment.   Culture, blood (routine x 2)     Status: None (Preliminary result)   Collection Time: 02/10/15 10:07 PM  Result Value Ref Range  Status   Specimen Description BLOOD LEFT ANTECUBITAL  Final   Special Requests BOTTLES DRAWN AEROBIC ONLY 1CC  Final   Culture NO GROWTH 3 DAYS  Final   Report Status PENDING  Incomplete  Culture, blood (routine x 2)     Status: None (Preliminary result)   Collection Time: 02/10/15 10:17 PM  Result Value Ref Range Status   Specimen Description BLOOD LEFT HAND  Final   Special Requests BOTTLES DRAWN AEROBIC ONLY 5CC  Final   Culture NO GROWTH 3 DAYS  Final   Report Status PENDING  Incomplete  Urine culture     Status: None   Collection Time: 02/11/15  9:44 AM  Result Value Ref Range Status   Specimen Description URINE, CLEAN CATCH  Final   Special Requests NONE  Final   Culture NO GROWTH 1 DAY  Final   Report Status 02/12/2015 FINAL  Final    Medical History: Past Medical History  Diagnosis Date  . Rheumatoid arthritis(714.0)     chronic steroids  . High cholesterol     takes Simvastatin daily  . Pneumonia     "has had walking pneumonia twice"  . Exertional dyspnea 03/04/12    "last few months"  . Chronic kidney disease 03/04/12    "  running ~ 40%"  . Umbilical hernia   . Hypothyroidism     takes SYnthroid daily  . Hypertension     takes Amlodipine and Imdur daily  . Coronary artery disease     takes Plavix daily  . GERD (gastroesophageal reflux disease)     takes Zantac daily  . Emphysema, interstitial 02/11/15    on CT  . Cirrhosis 02/11/15    on CT  . Diastolic CHF 02/11/15    after shoulder surgery    Medications:  Scheduled:  . aspirin EC  81 mg Oral QHS  . atorvastatin  80 mg Oral q1800  . clopidogrel  75 mg Oral QPC supper  . docusate sodium  100 mg Oral BID  . levothyroxine  100 mcg Oral QAC breakfast  . multivitamin with minerals  1 tablet Oral Daily  . omega-3 acid ethyl esters  1 g Oral Q supper  . pantoprazole  40 mg Oral BID  . piperacillin-tazobactam (ZOSYN)  IV  3.375 g Intravenous Q8H  . pneumococcal 23 valent vaccine  0.5 mL Intramuscular  Tomorrow-1000  . potassium chloride  40 mEq Oral Once  . predniSONE  10 mg Oral Q breakfast  . traZODone  50 mg Oral QHS  . vancomycin  750 mg Intravenous Q24H    Assessment: 80 y.o. male known to pharmacy from antibiotic dosing this admission. Phos 1.7, K 3.4. Pharmacy asked to help with phosphorus replacement. Estimated CrCl 37 ml/min.  Goal of Therapy:  Phosphorus 2.5-4.6  Plan:  Potassium phosphorus 20 mmol IV over 6 hours Will f/u repeat phosphorus in a.m. and replete further if needed  Christoper Fabian, PharmD, BCPS Clinical pharmacist, pager [phone] 02/15/2015,7:30 AM

## 2015-02-15 NOTE — Progress Notes (Signed)
   Subjective: 6 Days Post-Op Procedure(s) (LRB): RIGHT REVERSE SHOULDER TOTAL ARTHROPLASTY (Right)  Pt c/o mild pain in right shoulder currently  Scheduled for ultrasound of abdomen later today to evaluate cirrhosis  Noted rapid response from earlier this morning Remains on venturi mask for ventilation support Patient reports pain as mild.  Objective:   VITALS:   Filed Vitals:   02/15/15 0500  BP: 162/68  Pulse: 99  Temp:   Resp: 18   Pt alert but confused about time this morning Right shoulder incision healing well nv intact distally Mild edema in the right elbow and upper arm Mild guarding with rom  LABS  Recent Labs  02/13/15 0250 02/14/15 0233 02/15/15 0512  HGB 9.8* 9.4* 10.1*  HCT 30.6* 29.0* 31.4*  WBC 10.9* 14.7* 14.0*  PLT 77* 65* 87*     Recent Labs  02/13/15 0250 02/14/15 0233 02/15/15 0512  NA 134* 137 137  K 3.4* 3.7 3.4*  BUN 25* 25* 24*  CREATININE 1.84* 1.57* 1.42*  GLUCOSE 96 113* 135*     Assessment/Plan: 6 Days Post-Op Procedure(s) (LRB): RIGHT REVERSE SHOULDER TOTAL ARTHROPLASTY (Right) Right shoulder stable currently Continue IV antibiotics and supplemental O2 for pneumonia Will defer to medical team when discharge appropriate but would it expect at least 2-3 more days PT/OT as able Will continue to monitor his status    Alphonsa Overall, MPAS, PA-C  02/15/2015, 7:46 AM

## 2015-02-15 NOTE — Progress Notes (Addendum)
[facility] PROGRESS NOTE  [name] [name]:[id number] DOB: 04/05/1935 DOA: 01/27/2015 PCP: Georgann Housekeeper, MD   Assessment/plan #1 acute hypoxic respiratory failure Patient noted to be tachycardic and hypoxic requiring Venturi mask. Patient's O2 sats improved somewhat and patient less agitated. Likely secondary to healthcare associated pneumonia with underlying COPD as patient is being treated for. Patient had a VQ scan that showed low probability for PE. Patient had a 2-D echo with a EF of 55-60% with no wall motion abnormalities and grade 1 diastolic dysfunction. High-resolution CT chest showing collapse/consolidation in the right lower lobe may be due to atelectasis however pneumonia is not excluded. Three-vessel coronary artery calcification. Cirrhosis. Patient also noted to have a fever. Blood cultures are pending. Check ABG. Check a chest x-ray. Cycle cardiac enzymes every 6 hours 3. Transfer to stepdown unit. Place on high flow O2. Continue empiric IV antibiotics. Add scheduled nebulizers. Continue oxygen. Follow. If worsening or no improvement will need to reconsult with critical-care medicine.  #2 healthcare associated pneumonia/Sepsis Patient spiking fevers. Blood cultures are pending. Continue empiric IV antibiotics, oxygen. Add scheduled nebulizers. Follow.  #3 hypokalemia/hypomagnesemia Replete.  #4 sinus tachycardia with occasional PACs Patient noted to be sinus tachycardia on monitor and also per EKG. No ST-T wave abnormalities. Likely secondary to problems #1 and 2. Check a TSH. Cycle cardiac enzymes every 6 hours 3. Patient with recent 2-D echo with a EF of 55-60% with no wall motion abnormalities and grade 1 diastolic dysfunction. Follow.  #5 acute kidney injury on chronic kidney disease stage III Improvement. Follow.  #6 questionable cirrhosis on CT scan Abdominal ultrasound pending. INR at 1.16. LFTs elevated. Will likely need outpatient follow-up.  #7 coronary  artery disease history of PCI (2011) Patient denies any acute chest pain however has some chest discomfort with coughing. Patient was noted to have troponins peaked 0.12. 2-D echo with EF of 55-60% with no wall motion abnormalities. Continue aspirin, Plavix, statin. Per cardiology.  #8 rheumatoid arthritis Continue home dose prednisone.  #9 hypothyroidism Check a TSH. Continue home dose Synthroid.  #10 agitation Patient noted to be agitated. Patient's wife complained that patient hasn't slept in days. Will place patient on Seroquel 25 mg daily at bedtime for agitation and sleep. Discontinue Haldol as QTC is prolonged. Place on Ativan 0.5 mg 3 times a day when necessary agitation. Will change trazodone to 75 mg by mouth daily at bedtime when necessary insomnia. Follow.  #11 prophylaxis PPI for GI prophylaxis.Scds for DVT      Code Status: DO NOT RESUSCITATE Family Communication: Updated patient and wife at bedside. Disposition Plan: Transfer to stepdown unit.   Consultants:  Cardiology: Dr. Zachery Conch 01/24/2015  [facility] Dr. Allena Katz 02/10/2015, Dr. York Spaniel 02/14/2015  Rehabilitation medicine Dr. Riley Kill 02/15/2015  Procedures:  Chest x-ray 02/04/2015, 02/10/2015, 02/13/2015, 02/15/2015  VQ scan 02/12/2015  CT chest 02/12/2015  2-D echo 02/11/2015  Lower extremity Doppler 02/12/2015  Right shoulder reverse total shoulder arthroplasty per Dr. Ranell Patrick 02/10/2015  Antibiotics:  IV vancomycin 02/10/2015  IV Zosyn 02/10/2015  IV Ancef 01/22/2015>>>> 02/10/2015  HPI/Subjective: It was called by nursing that patient tachycardic with heart rates in the 150s and patient with shortness of breath on Venturi mask. Came and assessed the patient patient denies any acute chest pain however states has some chest pain with coughing associated with his pneumonia. Patient also endorses some shortness of breath. Patient's wife stated that patient has not slept in days and needs  something for sleep.  Objective: Filed Vitals:  02/15/15 1106  BP:   Pulse: 164  Temp:   Resp:     Intake/Output Summary (Last 24 hours) at 02/15/15 1142 Last data filed at 02/15/15 0731  Gross per 24 hour  Intake    440 ml  Output    500 ml  Net    -60 ml   Filed Weights   02/12/15 0200 02/13/15 0500 02/14/15 0415  Weight: 62 kg (136 lb 11 oz) 64.2 kg (141 lb 8.6 oz) 61.5 kg (135 lb 9.3 oz)    Exam:   General:  On Venturi mask  Cardiovascular: tachycardia, no m/r/g  Respiratory: Bibasilar crackles. No wheezing. Scattared rhonchi  Abdomen: Soft, nontender, nondistended, positive bowel sounds.  Musculoskeletal: No clubbing no cyanosis no edema. Hands with chronic changes of rheumatoid arthritis noted.  Data Reviewed: Basic Metabolic Panel:  Recent Labs Lab 02/11/15 0528 02/12/15 0955 02/13/15 0250 02/14/15 0233 02/15/15 0512  NA 133* 133* 134* 137 137  K 3.7 3.6 3.4* 3.7 3.4*  CL 102 101 100* 101 106  CO2 22 22 23 25 24   GLUCOSE 110* 116* 96 113* 135*  BUN 31* 24* 25* 25* 24*  CREATININE 2.10* 1.83* 1.84* 1.57* 1.42*  CALCIUM 7.3* 7.4* 7.1* 7.3* 7.6*  MG 1.6*  --  2.4 2.2 1.9  PHOS 3.9  --  2.8 2.8 1.7*   Liver Function Tests:  Recent Labs Lab 02/15/15 0512  AST 77*  ALT 14*  ALKPHOS 95  BILITOT 1.9*  PROT 6.2*  ALBUMIN 2.3*   No results for input(s): LIPASE, AMYLASE in the last 168 hours. No results for input(s): AMMONIA in the last 168 hours. CBC:  Recent Labs Lab 02/10/15 2207 02/11/15 0528 02/12/15 0955 02/13/15 0250 02/14/15 0233 02/15/15 0512  WBC 10.6* 9.3 8.9 10.9* 14.7* 14.0*  NEUTROABS 7.5  --   --   --   --   --   HGB 12.2* 10.4* 10.3* 9.8* 9.4* 10.1*  HCT 37.9* 32.2* 31.9* 30.6* 29.0* 31.4*  MCV 84.6 84.1 82.9 84.3 82.4 81.6  PLT 111* 100* 93* 77* 65* 87*   Cardiac Enzymes:  Recent Labs Lab 2015/02/13 1926 02/10/15 0050 02/10/15 0730 02/10/15 0911 02/10/15 1455 02/10/15 2119  CKTOTAL 168  --   --   --   --    --   CKMB 4.3  --   --   --   --   --   TROPONINI 0.04* 0.08* 0.11* 0.12* 0.08* 0.08*   BNP (last 3 results) No results for input(s): BNP in the last 8760 hours.  ProBNP (last 3 results) No results for input(s): PROBNP in the last 8760 hours.  CBG:  Recent Labs Lab 02/11/15 0335 02/11/15 0812  GLUCAP 107* 98    Recent Results (from the past 240 hour(s))  Culture, blood (routine x 2)     Status: None (Preliminary result)   Collection Time: 02/10/15 10:07 PM  Result Value Ref Range Status   Specimen Description BLOOD LEFT ANTECUBITAL  Final   Special Requests BOTTLES DRAWN AEROBIC ONLY 1CC  Final   Culture NO GROWTH 3 DAYS  Final   Report Status PENDING  Incomplete  Culture, blood (routine x 2)     Status: None (Preliminary result)   Collection Time: 02/10/15 10:17 PM  Result Value Ref Range Status   Specimen Description BLOOD LEFT HAND  Final   Special Requests BOTTLES DRAWN AEROBIC ONLY 5CC  Final   Culture NO GROWTH 3 DAYS  Final   Report  Status PENDING  Incomplete  Urine culture     Status: None   Collection Time: 02/11/15  9:44 AM  Result Value Ref Range Status   Specimen Description URINE, CLEAN CATCH  Final   Special Requests NONE  Final   Culture NO GROWTH 1 DAY  Final   Report Status 02/12/2015 FINAL  Final     Studies: No results found.  Scheduled Meds: . aspirin EC  81 mg Oral QHS  . atorvastatin  80 mg Oral q1800  . clopidogrel  75 mg Oral QPC supper  . docusate sodium  100 mg Oral BID  . levothyroxine  100 mcg Oral QAC breakfast  . multivitamin with minerals  1 tablet Oral Daily  . omega-3 acid ethyl esters  1 g Oral Q supper  . pantoprazole  40 mg Oral BID  . piperacillin-tazobactam (ZOSYN)  IV  3.375 g Intravenous Q8H  . pneumococcal 23 valent vaccine  0.5 mL Intramuscular Tomorrow-1000  . potassium chloride  40 mEq Oral Once  . potassium phosphate IVPB (mmol)  20 mmol Intravenous Once  . predniSONE  10 mg Oral Q breakfast  . traZODone  50 mg  Oral QHS  . vancomycin  750 mg Intravenous Q24H   Continuous Infusions:   Principal Problem:   S/P shoulder replacement Active Problems:   Acute respiratory failure with hypoxia   HCAP (healthcare-associated pneumonia)   CAD S/P LAD/OM PCI '07, LAD ISR-DES 02/2012   Hypertension   Chronic renal insufficiency, stage III (moderate)   Rheumatoid arthritis   Acute diastolic heart failure   Severe sepsis   Dysphagia   Effect of immunosuppressant therapy   Demand ischemia   Sinus tachycardia    Time spent: 40 MINS    [facility] MD [facility] Pager [phone]. If 7PM-7AM, please contact night-coverage at [email or web address], password [facility] 02/15/2015, 11:42 AM  LOS: 6 days            2

## 2015-02-15 NOTE — Progress Notes (Signed)
Pt combatant and reports chests pain. IM Hadol given, however 2 attempts to administer Nitro SL were not successful. HR at times reached as high as 150s with intermittent lows in the 90s. Rectal Temp WNL, breather mask applied. Cardiac on-call doctor consulted. Patient being observed.

## 2015-02-15 NOTE — Progress Notes (Signed)
[facility] has been notified by nurse. Nurse requests that TH comes to see patient. Patient's HR up to 160's. Cardiology paged as well. Awaiting to hear back from physicians.

## 2015-02-15 NOTE — Care Management (Signed)
Important Message  Patient Details  Name: [name] [name] MRN: [id number] Date of Birth: 13-Jul-1935   Medicare Important Message Given:  Yes-third notification given    Orson Aloe 02/15/2015, 10:26 AM

## 2015-02-15 NOTE — Progress Notes (Signed)
Report given to Joni Reining, RN on 2C. Patient now in room 2C16.

## 2015-02-16 DIAGNOSIS — Z515 Encounter for palliative care: Secondary | ICD-10-CM

## 2015-02-16 DIAGNOSIS — I4891 Unspecified atrial fibrillation: Secondary | ICD-10-CM

## 2015-02-16 LAB — PHOSPHORUS: PHOSPHORUS: 3.1 mg/dL (ref 2.5–4.6)

## 2015-02-16 LAB — BLOOD GAS, VENOUS
ACID-BASE DEFICIT: 0.4 mmol/L (ref 0.0–2.0)
Bicarbonate: 23.5 mEq/L (ref 20.0–24.0)
DRAWN BY: 235321
FIO2: 0.5 %
O2 Saturation: 87.4 %
PATIENT TEMPERATURE: 98.6
PCO2 VEN: 36.9 mmHg — AB (ref 45.0–50.0)
PH VEN: 7.421 — AB (ref 7.250–7.300)
TCO2: 24.7 mmol/L (ref 0–100)
pO2, Ven: 51.4 mmHg — ABNORMAL HIGH (ref 30.0–45.0)

## 2015-02-16 LAB — CBC
HCT: 33.3 % — ABNORMAL LOW (ref 39.0–52.0)
Hemoglobin: 10.5 g/dL — ABNORMAL LOW (ref 13.0–17.0)
MCH: 26.2 pg (ref 26.0–34.0)
MCHC: 31.5 g/dL (ref 30.0–36.0)
MCV: 83 fL (ref 78.0–100.0)
PLATELETS: 107 10*3/uL — AB (ref 150–400)
RBC: 4.01 MIL/uL — AB (ref 4.22–5.81)
RDW: 18.5 % — ABNORMAL HIGH (ref 11.5–15.5)
WBC: 11.5 10*3/uL — AB (ref 4.0–10.5)

## 2015-02-16 LAB — BLOOD GAS, ARTERIAL
Acid-base deficit: 1.5 mmol/L (ref 0.0–2.0)
Bicarbonate: 23 mEq/L (ref 20.0–24.0)
DELIVERY SYSTEMS: POSITIVE
Drawn by: 246861
Expiratory PAP: 5
FIO2: 0.6 %
Inspiratory PAP: 16
O2 Saturation: 99.2 %
Patient temperature: 98.6
TCO2: 24.2 mmol/L (ref 0–100)
pCO2 arterial: 40.8 mmHg (ref 35.0–45.0)
pH, Arterial: 7.37 (ref 7.350–7.450)
pO2, Arterial: 161 mmHg — ABNORMAL HIGH (ref 80.0–100.0)

## 2015-02-16 LAB — TROPONIN I: TROPONIN I: 0.19 ng/mL — AB (ref <0.031)

## 2015-02-16 LAB — BASIC METABOLIC PANEL
Anion gap: 12 (ref 5–15)
BUN: 22 mg/dL — ABNORMAL HIGH (ref 6–20)
CO2: 24 mmol/L (ref 22–32)
CREATININE: 1.76 mg/dL — AB (ref 0.61–1.24)
Calcium: 7.7 mg/dL — ABNORMAL LOW (ref 8.9–10.3)
Chloride: 105 mmol/L (ref 101–111)
GFR, EST AFRICAN AMERICAN: 41 mL/min — AB (ref >60)
GFR, EST NON AFRICAN AMERICAN: 35 mL/min — AB (ref >60)
GLUCOSE: 140 mg/dL — AB (ref 65–99)
Potassium: 3.6 mmol/L (ref 3.5–5.1)
Sodium: 141 mmol/L (ref 135–145)

## 2015-02-16 LAB — CULTURE, BLOOD (ROUTINE X 2)
Culture: NO GROWTH
Culture: NO GROWTH

## 2015-02-16 LAB — MAGNESIUM: MAGNESIUM: 2 mg/dL (ref 1.7–2.4)

## 2015-02-16 MED ORDER — POTASSIUM CHLORIDE CRYS ER 20 MEQ PO TBCR: 40.0000 meq | EXTENDED_RELEASE_TABLET | Freq: Once | ORAL | Status: DC

## 2015-02-16 MED ORDER — SODIUM CHLORIDE 0.9 % IV BOLUS (SEPSIS): 500.0000 mL | Freq: Once | INTRAVENOUS | Status: AC

## 2015-02-16 MED ORDER — SODIUM CHLORIDE 0.9 % IV BOLUS (SEPSIS)
1000.0000 mL | Freq: Once | INTRAVENOUS | Status: AC
  Administered 2015-02-16: 1000 mL via INTRAVENOUS

## 2015-02-16 MED ORDER — SODIUM CHLORIDE 0.9 % IV SOLN
INTRAVENOUS | Status: DC
  Administered 2015-02-16 – 2015-02-17 (×2): via INTRAVENOUS

## 2015-02-16 MED ORDER — AMIODARONE LOAD VIA INFUSION
150.0000 mg | Freq: Once | INTRAVENOUS | Status: AC
  Administered 2015-02-16: 150 mg via INTRAVENOUS
  Filled 2015-02-16: qty 83.34

## 2015-02-16 MED ORDER — POTASSIUM CHLORIDE 10 MEQ/100ML IV SOLN
10.0000 meq | INTRAVENOUS | Status: AC
  Administered 2015-02-16 (×4): 10 meq via INTRAVENOUS
  Filled 2015-02-16 (×4): qty 100

## 2015-02-16 MED ORDER — AMIODARONE HCL IN DEXTROSE 360-4.14 MG/200ML-% IV SOLN
30.0000 mg/h | INTRAVENOUS | Status: DC
  Administered 2015-02-16 – 2015-02-17 (×2): 30 mg/h via INTRAVENOUS
  Filled 2015-02-16 (×6): qty 200

## 2015-02-16 MED ORDER — SODIUM CHLORIDE 0.9 % IV BOLUS (SEPSIS)
500.0000 mL | Freq: Once | INTRAVENOUS | Status: AC
  Administered 2015-02-16: 500 mL via INTRAVENOUS

## 2015-02-16 MED ORDER — AMIODARONE HCL IN DEXTROSE 360-4.14 MG/200ML-% IV SOLN
60.0000 mg/h | INTRAVENOUS | Status: AC
  Administered 2015-02-16 (×2): 60 mg/h via INTRAVENOUS
  Filled 2015-02-16: qty 200

## 2015-02-16 NOTE — Progress Notes (Signed)
Pt HR nonsustained in the 180's with BP trending down into 60's/40's.  NP Craige Cotta was called to floor to observe.  bolus was ordered along with Amiodorone gtt.  All orders carried out. Will continue to monitor.

## 2015-02-16 NOTE — Progress Notes (Signed)
SLP Cancellation Note  Patient Details Name: [name] [name] MRN: [id number] DOB: 09-Feb-1935   Cancelled treatment:        Attempted to arouse pt earlier this am, pt had morphine around 4 am and unable to respond given max stimuli. This afternoon RN reported pt not alert. ST will check on status next date.   [name] 02/16/2015, 2:15 PM   [name].Ed [employer] [phone]

## 2015-02-16 NOTE — Progress Notes (Signed)
Bolus of NS complete and loading dose of amio administered.  BP trending upward and HR returing to the 100's to 140's nonsustained.  Will continue to monitor.

## 2015-02-16 NOTE — Progress Notes (Signed)
Initial Nutrition Assessment  DOCUMENTATION CODES:  Not applicable  INTERVENTION:   (RD will make further recommendations based on GOC)  NUTRITION DIAGNOSIS:  Inadequate oral intake related to inability to eat as evidenced by NPO status.   GOAL:  Patient will meet greater than or equal to 90% of their needs   MONITOR:  Diet advancement, Labs, Weight trends, Skin, I & O's  REASON FOR ASSESSMENT:  Low Braden    ASSESSMENT: 62 M with RA on enteracept and CAD who underwent a shoulder atheroplasty on 6/24. Post-operatively he was hypoxemic and cardiology was consulted. Early on 6/26 he developed worsening hypoxemia and PCCM was consulted.   Pt in with MD at time of visit.  Pt s/p shoulder replacement on 02/03/2015.   Rapid response called on 02/15/15 and pt was transferred to SDU. He is currently on Bi-Pap. Per CCM note, pt is unresponsive and is recommending transition to comfort care. Palliative consult pending.   Height:  Ht Readings from Last 1 Encounters:  02/15/15 5\' 5"  (1.651 m)    Weight:  Wt Readings from Last 1 Encounters:  02/16/15 131 lb 2.8 oz (59.5 kg)    Ideal Body Weight:  61.8 kg  Wt Readings from Last 10 Encounters:  02/16/15 131 lb 2.8 oz (59.5 kg)  02/01/15 137 lb 11.2 oz (62.46 kg)  01/08/15 129 lb 3 oz (58.6 kg)  01/02/15 136 lb 14.4 oz (62.097 kg)  10/31/14 137 lb (62.143 kg)  05/04/14 137 lb (62.143 kg)  01/31/14 134 lb 12.8 oz (61.145 kg)  06/17/13 130 lb (58.968 kg)  03/05/12 140 lb 6.9 oz (63.7 kg)  11/22/11 140 lb (63.504 kg)    BMI:  Body mass index is 21.83 kg/(m^2).  Estimated Nutritional Needs:  Kcal:  1500-1700  Protein:  65-75 grams  Fluid:  1.5-1.7 L  Skin:  Reviewed, no issues (closed rt shoulder incision)  Diet Order:  Diet NPO time specified  EDUCATION NEEDS:  No education needs identified at this time   Intake/Output Summary (Last 24 hours) at 02/16/15 1604 Last data filed at 02/16/15 1300  Gross per  24 hour  Intake 1925.58 ml  Output    250 ml  Net 1675.58 ml    Last BM:  02/14/15  Dejae Bernet A. 02/16/15, RD, LDN, CDE Pager: [phone] After hours Pager: [phone]

## 2015-02-16 NOTE — Progress Notes (Signed)
Patient Name: [name] [name] Date of Encounter: 02/16/2015  Principal Problem:   S/P shoulder replacement Active Problems:   CAD S/P LAD/OM PCI '07, LAD ISR-DES 02/2012   Hypertension   Chronic renal insufficiency, stage III (moderate)   Acute respiratory failure with hypoxia   Rheumatoid arthritis   Acute diastolic heart failure   Severe sepsis   HCAP (healthcare-associated pneumonia)   Dysphagia   Effect of immunosuppressant therapy   Demand ischemia   Sinus tachycardia   New onset a-fib   Palliative care encounter  SUBJECTIVE  Last seen by cardiologist 6/28. Minimally responsive to noxuious stimuli. Just moving hands and legs. Spoken with wife at bedside.   CURRENT MEDS . aspirin EC  81 mg Oral QHS  . atorvastatin  80 mg Oral q1800  . clopidogrel  75 mg Oral QPC supper  . docusate sodium  100 mg Oral BID  . ipratropium  0.5 mg Nebulization Q6H  . levalbuterol  0.63 mg Nebulization Q6H  . levothyroxine  100 mcg Oral QAC breakfast  . multivitamin with minerals  1 tablet Oral Daily  . omega-3 acid ethyl esters  1 g Oral Q supper  . pantoprazole  40 mg Oral BID  . piperacillin-tazobactam (ZOSYN)  IV  3.375 g Intravenous Q8H  . pneumococcal 23 valent vaccine  0.5 mL Intramuscular Tomorrow-1000  . potassium chloride  10 mEq Intravenous Q1 Hr x 4  . predniSONE  10 mg Oral Q breakfast  . vancomycin  1,000 mg Intravenous Q24H    OBJECTIVE  Filed Vitals:   02/16/15 0354 02/16/15 0400 02/16/15 0809 02/16/15 0828  BP:  80/49 154/53   Pulse:  99 101   Temp:  97.8 F (36.6 C) 97.5 F (36.4 C)   TempSrc:  Axillary Axillary   Resp:  24 19   Height:      Weight: 131 lb 2.8 oz (59.5 kg)     SpO2:  99% 98% 98%    Intake/Output Summary (Last 24 hours) at 02/16/15 1140 Last data filed at 02/16/15 0600  Gross per 24 hour  Intake 1492.18 ml  Output      0 ml  Net 1492.18 ml   Filed Weights   02/14/15 0415 02/15/15 1500 02/16/15 0354  Weight: 135 lb 9.3 oz (61.5 kg)  129 lb 8 oz (58.741 kg) 131 lb 2.8 oz (59.5 kg)    PHYSICAL EXAM  General: On Venturi mask, With use of accessory muscles of respiration.Thoracoabdominal breathing. Neuro:  Moves all extremities spontaneously. Psych: unable to assess  Neck: Supple without bruits or JVD. Lungs:  Scattered coarse BS, venturi mask in place.  Heart: irregular  no s3, s4, or murmurs. Abdomen: Soft, non-tender, non-distended, BS + x 4.  Extremities: No clubbing, cyanosis or edema. DP/PT/Radials 2+ and equal bilaterally.  Accessory Clinical Findings  CBC  Recent Labs  02/15/15 0512 02/16/15 0145  WBC 14.0* 11.5*  HGB 10.1* 10.5*  HCT 31.4* 33.3*  MCV 81.6 83.0  PLT 87* 107*   Basic Metabolic Panel  Recent Labs  02/15/15 0512 02/16/15 0145 02/16/15 0900  NA 137 141  --   K 3.4* 3.6  --   CL 106 105  --   CO2 24 24  --   GLUCOSE 135* 140*  --   BUN 24* 22*  --   CREATININE 1.42* 1.76*  --   CALCIUM 7.6* 7.7*  --   MG 1.9  --  2.0  PHOS 1.7* 3.1  --  Liver Function Tests  Recent Labs  02/15/15 0512  AST 77*  ALT 14*  ALKPHOS 95  BILITOT 1.9*  PROT 6.2*  ALBUMIN 2.3*   Cardiac Enzymes  Recent Labs  02/15/15 1226 02/15/15 1826 02/15/15 2305  TROPONINI 0.21* 0.18* 0.19*   BThyroid Function Tests  Recent Labs  02/15/15 1226  TSH 1.142    TELE  irregular rhythm at rate of 80-100s, few runs of SVT, in and out of afib  Radiology/Studies   ASSESSMENT AND PLAN   1. Afib with RVR - Went in to Afib at rate over 150s around 1:40 am, going in and out of afib since then, currently irregular rhythm at rate of 70-100s. currently on amio drip. likely due to acute respiratory failure. - CHADSVASc score of at least 5 (age 15, CHF, CAD, HTN) - Has has few small runs of SVT - Currently on ASA and plavix. He will need long term anticoagulation, however acute illness. MD to decide further management.   2. Acute diastolic heart failure  - appears euvolemic.   3. Elevated  troponin - 0.12 peak. Flat trend. likely from acute illness. Continue ASA and plavix.   4. Acute hypoxic respiratory failure  - CCM following, palced on BiPAP  5. Hypotensive - Given bolus saline last night, BP relatively stable now   Dispo: on DNR and BIPAP and comfort care.   Lorelei Pont PA-C Pager [phone]  Patient seen and examined and history reviewed. Agree with above findings and plan. Seen again for evaluation of AFib with RVR. Patient seen by me earlier in week in ICU. Transferred to floor but developed increased respiratory distress and decline in mental status. Now on Bipap. Poorly responsive. I reviewed telemetry. He has sinus rhythm with frequent PACs and runs of atrial tachycardia. There are some episodes that may be AFib but I think is more MAT. Now on IV amiodarone. Rate improved. I think this is driven by his acute pulmonary issues with MAT. IV amiodarone is reasonable in the short term but would be a poor choice in the long run with his underlying pulmonary disease and cirrhosis. Will need to watch closely on amiodarone. His overall prognosis is very poor. He does not appear to be volume overloaded and I agree that troponin elevation is just due to his acute illness.  Peter [place], MDFACC 02/16/2015 12:45 PM

## 2015-02-16 NOTE — Progress Notes (Signed)
Placed patient on BiPAP per MD's order. 

## 2015-02-16 NOTE — Progress Notes (Signed)
PULMONARY / CRITICAL CARE MEDICINE   Name: [name] [name] MRN: [id number] DOB: 05-06-1935    ADMISSION DATE:  02/13/2015  CHIEF COMPLAINT:  Hypoxic Resp Failure  INITIAL PRESENTATION: 62 M with RA on enteracept and CAD who underwent a shoulder atheroplasty on 6/24. Post-operatively he was hypoxemic and cardiology was consulted. Early on 6/26 he developed worsening hypoxemia and PCCM was consulted.   STUDIES:  ABG on NRB 7.421/37/74.3  SIGNIFICANT EVENTS: 6/26 transfer to ICU for hypotension and hypoxemia.  HISTORY OF PRESENT ILLNESS:  [name] [name] is a 31 M with RA on enteracept and CAD who underwent a shoulder atheroplasty on 6/24. Post-operatively he was hypoxemic and cardiology was consulted. It did not improve with diuresis and he developed AKI on CKD. Early on 6/26 he developed worsening hypoxemia and PCCM was consulted. He is currently without complaint. He denies feeling feverish, chills, CP, SOB, or cough.   SUBJECTIVE: Down to [place], intermittent confusion, asking to go home, complaining of no rest overnight.  VITAL SIGNS: Temp:  [97 F (36.1 C)-100 F (37.8 C)] 97.5 F (36.4 C) (07/01 0809) Pulse Rate:  [60-164] 101 (07/01 0809) Resp:  [19-28] 19 (07/01 0809) BP: (80-160)/(48-105) 154/53 mmHg (07/01 0809) SpO2:  [97 %-100 %] 98 % (07/01 0828) FiO2 (%):  [50 %-55 %] 50 % (07/01 0828) Weight:  [129 lb 8 oz (58.741 kg)-131 lb 2.8 oz (59.5 kg)] 131 lb 2.8 oz (59.5 kg) (07/01 0354) HEMODYNAMICS:   VENTILATOR SETTINGS: Vent Mode:  [-]  FiO2 (%):  [50 %-55 %] 50 % INTAKE / OUTPUT:  Intake/Output Summary (Last 24 hours) at 02/16/15 1031 Last data filed at 02/16/15 0600  Gross per 24 hour  Intake 1492.18 ml  Output      0 ml  Net 1492.18 ml   PHYSICAL EXAMINATION: General:  Elderly M in NAD on 4L . Neuro:  Confused at times but appropriate this AM. HEENT:  Sclera anicteric, conjunctiva pink, MMM, OP Clear Cardiovascular:  RRR, NS1/S2, (-) MRG Lungs:  Rales at  bases Abdomen:  S/NT/ND/(+)BS Musculoskeletal:  (-) C/C/E Skin:  Intact  LABS:  CBC  Recent Labs Lab 02/14/15 0233 02/15/15 0512 02/16/15 0145  WBC 14.7* 14.0* 11.5*  HGB 9.4* 10.1* 10.5*  HCT 29.0* 31.4* 33.3*  PLT 65* 87* 107*   Coag's  Recent Labs Lab 02/14/15 1944  INR 1.16   BMET  Recent Labs Lab 02/14/15 0233 02/15/15 0512 02/16/15 0145  NA 137 137 141  K 3.7 3.4* 3.6  CL 101 106 105  CO2 25 24 24   BUN 25* 24* 22*  CREATININE 1.57* 1.42* 1.76*  GLUCOSE 113* 135* 140*   Electrolytes  Recent Labs Lab 02/14/15 0233 02/15/15 0512 02/16/15 0145 02/16/15 0900  CALCIUM 7.3* 7.6* 7.7*  --   MG 2.2 1.9  --  2.0  PHOS 2.8 1.7* 3.1  --    Sepsis Markers  Recent Labs Lab 02/10/15 2207  LATICACIDVEN 1.5   ABG  Recent Labs Lab 02/14/2015 1835 02/10/15 2210 02/15/15 1205  PHART 7.423 7.421 7.488*  PCO2ART 32.2* 37.1 31.0*  PO2ART 78.7* 74.3* 70.9*   Liver Enzymes  Recent Labs Lab 02/15/15 0512  AST 77*  ALT 14*  ALKPHOS 95  BILITOT 1.9*  ALBUMIN 2.3*   Cardiac Enzymes  Recent Labs Lab 02/15/15 1226 02/15/15 1826 02/15/15 2305  TROPONINI 0.21* 0.18* 0.19*   Glucose  Recent Labs Lab 02/11/15 0335 02/11/15 0812  GLUCAP 107* 98   Imaging Dg Chest Port 1 [address]  02/15/2015   CLINICAL DATA:  Confused.  Short of breath.  Follow-up exam.  EXAM: PORTABLE CHEST - 1 VIEW  COMPARISON:  02/13/2015  FINDINGS: Study is partly degraded by motion. There is bilateral interstitial thickening more evident in the perihilar and lower lungs. Although this appears increased from the prior exam, this may be due to the respiratory motion only. The possibility of superimposed interstitial edema should be considered given the history of shortness of breath  Mild enlargement of the cardiopericardial silhouette is stable. No mediastinal or hilar masses. No pneumothorax.  IMPRESSION: 1. Exam somewhat limited by motion. Possible interstitial edema superimposed  on known emphysema and chronic interstitial thickening.   Electronically Signed   By: Amie Portland M.D.   On: 02/15/2015 11:58   US Abdomen Limited Ruq  02/15/2015   CLINICAL DATA:  Cirrhosis.  EXAM: US ABDOMEN LIMITED - RIGHT UPPER QUADRANT  COMPARISON:  None.  FINDINGS: Gallbladder:  Distended, hydropic gallbladder noted. Gallbladder wall thickness measures 2.3 mm. No pericholecystic fluid. Negative sonographic Murphy's sign.  Common bile duct:  Diameter: 3.0  Liver:  The liver is diffusely echogenic with a nodular contour or compatible with cirrhosis.  IMPRESSION: 1. Morphologic features of liver compatible with cirrhosis. 2. Hydropic gallbladder without evidence for gallbladder wall thickening.   Electronically Signed   By: Signa Kell M.D.   On: 02/15/2015 19:21     Intake/Output Summary (Last 24 hours) at 02/16/15 1040 Last data filed at 02/16/15 0600  Gross per 24 hour  Intake 1492.18 ml  Output      0 ml  Net 1492.18 ml   Filed Weights   02/14/15 0415 02/15/15 1500 02/16/15 0354  Weight: 135 lb 9.3 oz (61.5 kg) 129 lb 8 oz (58.741 kg) 131 lb 2.8 oz (59.5 kg)    ASSESSMENT / PLAN:  PULMONARY A:  Acute hypoxic respiratory failure: Most likely 2/2 pneumonia but concern is for unrecognized ILD with severe RA. No evidence of fluid overload on exam currently. PE in DDx as well.  Another concern is aspiration. P:   Supplemental O2 to maintain sats ?>= 92% Aggressive HCAP coverage, will need a total of 8 days of abx. LE dopplers negative V/Q scan to r/o PE negative Chest CT without contrast ordered for ?ILD with high resolution cuts severe emphysema and evidence of some fibrosis. Swallow evaluation noted. Double PPI and d/c H2 blocker. Home dose of prednisone 5 mg PO daily, will decrease to 10 mg PO daily x5 days then decrease to 5 and maintain at that. BiPAP for comfort at this point, high work of breathing. Ambulatory desat study   CARDIOVASCULAR A:  Tachycardia , Afib  with RVR  Sepsis: Likely 2/2 HCAP Hypotension: fluctuates depending on HR and sedation  CAD Presumed HFpEF: Appears euvolemic on exam.  Elevated Troponin: Echo with EF of 55-60% with grade one diastolic dysfunction. P:  KVO IVF  Steroids as above Consider cards consult. Currently on amio drip Cont OP CAD/HTN Regimen IV fluids  RENAL Lab Results  Component Value Date   CREATININE 1.76* 02/16/2015   CREATININE 1.42* 02/15/2015   CREATININE 1.57* 02/14/2015    Recent Labs Lab 02/14/15 0233 02/15/15 0512 02/16/15 0145  NA 137 137 141    A:   AKI on CKD: Presumably 2/2 sepsis Hyponatremia P:   KVO IVF Lasix 40 mg IV q8 x2 doses. BMET in AM. Replace electrolytes as indicated.  GASTROINTESTINAL A:  Concern for aspiration (patient reports significant history). P:  Speech therapy to evaluate. If more awake in future , npo for now. Double up on PPI  HEMATOLOGIC  Recent Labs  02/15/15 0512 02/16/15 0145  HGB 10.1* 10.5*    A:  Anemia: Chronic. Likely AOCD. P:  Consider Fe Studies as OP if improves  INFECTIOUS A:  HCAP P:   BCx2: NGTD UC: NGTD Sputum: NGTD Abx: Vanc/Zosyn, start date 6/26, day 6/8  ENDOCRINE CBG (last 3)  No results for input(s): GLUCAP in the last 72 hours.   A:   Elevated Blood Glucose Hypthyroidism P:   SSI A1c Cont Levothyroxine  NEUROLOGIC A:  Very obtunded or agitated. Doses with ativan and MSO4 during the night.  P: Haldol PRN. Maintain O2. Minimize narcs as able. DC anxiolytics unless we go to full palliation mode.  RHEUMATIC: A:  RA:  P: Steroids as ordered.  FAMILY  - Updates: Long discussion with daughter RN in [place]. No code but will continue NIMVS till daughter arrives for further code clarification.    - Inter-disciplinary family meet or Palliative Care meeting due by:  7/3   Spoke with wife, two daughters are coming in.  I recommended involvement of palliative care and will place order in.   Confirmed DNR status.  Patient will likely expire without the ventilator and wife is aware of that.  Will continue DNR status, continue BiPAP but patient now is completely unresponsive and recommend progression to comfort care.  Will place palliative care consultation order in.  PCCM will sign off, please call back if needed.  The patient is critically ill with multiple organ systems failure and requires high complexity decision making for assessment and support, frequent evaluation and titration of therapies, application of advanced monitoring technologies and extensive interpretation of multiple databases.   Critical Care Time devoted to patient care services described in this note is  35  Minutes. This time reflects time of care of this signee Dr Koren Bound. This critical care time does not reflect procedure time, or teaching time or supervisory time of PA/NP/Med student/Med Resident etc but could involve care discussion time.  Alyson Reedy, M.D. [facility] Pulmonary/Critical Care Medicine. Pager: [phone]. After hours pager: [phone].  02/16/2015, 10:31 AM

## 2015-02-16 NOTE — Progress Notes (Signed)
[facility] PROGRESS NOTE/consult  [name] [name] ZRA:[id number] DOB: 1935-03-20 DOA: 01/30/2015 PCP: Georgann Housekeeper, MD   Assessment/plan #1 acute hypoxic respiratory failure Patient noted to be tachycardic and hypoxic requiring Venturi mask yesterday and today. Patient with increased use of accessory muscles of respiration. Likely secondary to healthcare associated pneumonia with underlying COPD as patient is being treated for and new onset A. fib. Patient had a VQ scan that showed low probability for PE. Patient had a 2-D echo with a EF of 55-60% with no wall motion abnormalities and grade 1 diastolic dysfunction. High-resolution CT chest showing collapse/consolidation in the right lower lobe may be due to atelectasis however pneumonia is not excluded. Three-vessel coronary artery calcification. Cirrhosis. Patient also noted to have a fever yesterday with fever curve trending down. ABG done this morning with a pH of 7.42 PCO2 of 37, O2 of 51. Chest x-ray with possible interstitial edema superimposed on known emphysema and chronic interstitial thickening. Patient was given a dose of IV Lasix yesterday. Cardiac enzymes mildly elevated however seem to have plateaued. Patient now in A. fib with RVR on amiodarone drip. Continue IV antibiotics, scheduled nebulizers. Patient with use of accessory muscles of respiration. Will place on the BiPAP. Will consult with critical-care for further evaluation and management.  #2 healthcare associated pneumonia/Sepsis Patient with fever curve trending down. Blood cultures are pending. Continue empiric IV antibiotics, oxygen, scheduled nebulizers. Follow.  #3 hypokalemia/hypomagnesemia/hypophosphatemia Replete.  #4 sinus tachycardia with occasional PACs/now new onset A. fib Patient noted to be sinus tachycardia on monitor yesterday and also per EKG. No ST-T wave abnormalities. Likely secondary to problems #1 and 2. Patient overnight noted to go into new onset  atrial fibrillation heart rate in the 180s during patient currently on amiodarone drip. TSH within normal limits. Cardiac enzymes with troponin is elevated which seemed to have plateaued. Patient with recent 2-D echo with a EF of 55-60% with no wall motion abnormalities and grade 1 diastolic dysfunction. Continue amiodarone drip for now. Will consult with cardiology for further evaluation and management. Follow.  #5 acute kidney injury on chronic kidney disease stage III Improvement. Close to baseline. Follow.  #6 questionable cirrhosis on CT scan Abdominal ultrasound pending. INR at 1.16. LFTs elevated. Will likely need outpatient follow-up.  #7 coronary artery disease history of PCI (2011) Patient denied any acute chest pain yesterday, however has some chest discomfort with coughing. Patient was noted to have troponins peaked 0.12. 2-D echo with EF of 55-60% with no wall motion abnormalities. Continue aspirin, Plavix, statin. Per cardiology.  #8 rheumatoid arthritis Continue prednisone.  #9 hypothyroidism TSH within normal limits at 1.142. Continue home dose Synthroid.  #10 agitation Patient noted to be agitated. Patient's wife complained that patient hasn't slept in days yesterday. Discontinued Haldol as QTC is prolonged. Change  Ativan 0.25 mg 3 times a day when necessary agitation. Will change trazodone to 50 mg by mouth daily at bedtime when necessary insomnia. Follow.  #11 status post right reverse shoulder total arthroplasty Per primary team.  #12 prophylaxis PPI for GI prophylaxis.Scds for DVT      Code Status: DO NOT RESUSCITATE Family Communication: Updated patient. No family at bedside. Disposition Plan: Remain in stepdown unit.   Consultants:  Cardiology: Dr. Zachery Conch 02/14/2015  [facility] Dr. Allena Katz 02/10/2015, Dr. York Spaniel 02/14/2015  Rehabilitation medicine Dr. Riley Kill 02/15/2015  Procedures:  Chest x-ray 01/22/2015, 02/10/2015, 02/13/2015,  02/15/2015  VQ scan 02/12/2015  CT chest 02/12/2015  2-D echo 02/11/2015  Lower extremity Doppler  02/12/2015  Right shoulder reverse total shoulder arthroplasty per Dr. Ranell Patrick 02/08/2015  Antibiotics:  IV vancomycin 02/10/2015  IV Zosyn 02/10/2015  IV Ancef 02/14/2015>>>> 02/10/2015  HPI/Subjective: Patient minimally responsive to noxuious stimuli. Patient received pain meds early this morning. Events overnight noted with patient going into A. fib heart rates in the 180s. Patient currently on amiodarone drip.  Objective: Filed Vitals:   02/16/15 0809  BP: 154/53  Pulse: 101  Temp: 97.5 F (36.4 C)  Resp: 19    Intake/Output Summary (Last 24 hours) at 02/16/15 0853 Last data filed at 02/16/15 0600  Gross per 24 hour  Intake 1492.18 ml  Output      0 ml  Net 1492.18 ml   Filed Weights   02/14/15 0415 02/15/15 1500 02/16/15 0354  Weight: 61.5 kg (135 lb 9.3 oz) 58.741 kg (129 lb 8 oz) 59.5 kg (131 lb 2.8 oz)    Exam:   General:  On Venturi mask, With use of accessory muscles of respiration.Thoracoabdominal breathing.  Cardiovascular: Irregularly irregular, no m/r/g  Respiratory: Some scattered coarse BS anterior lung fields.  Abdomen: Soft, nontender, nondistended, positive bowel sounds.  Musculoskeletal: No clubbing no cyanosis no edema. Hands with chronic changes of rheumatoid arthritis noted.  Data Reviewed: Basic Metabolic Panel:  Recent Labs Lab 02/11/15 0528 02/12/15 0955 02/13/15 0250 02/14/15 0233 02/15/15 0512 02/16/15 0145  NA 133* 133* 134* 137 137 141  K 3.7 3.6 3.4* 3.7 3.4* 3.6  CL 102 101 100* 101 106 105  CO2 22 22 23 25 24 24   GLUCOSE 110* 116* 96 113* 135* 140*  BUN 31* 24* 25* 25* 24* 22*  CREATININE 2.10* 1.83* 1.84* 1.57* 1.42* 1.76*  CALCIUM 7.3* 7.4* 7.1* 7.3* 7.6* 7.7*  MG 1.6*  --  2.4 2.2 1.9  --   PHOS 3.9  --  2.8 2.8 1.7* 3.1   Liver Function Tests:  Recent Labs Lab 02/15/15 0512  AST 77*  ALT 14*   ALKPHOS 95  BILITOT 1.9*  PROT 6.2*  ALBUMIN 2.3*   No results for input(s): LIPASE, AMYLASE in the last 168 hours. No results for input(s): AMMONIA in the last 168 hours. CBC:  Recent Labs Lab 02/10/15 2207  02/12/15 0955 02/13/15 0250 02/14/15 0233 02/15/15 0512 02/16/15 0145  WBC 10.6*  < > 8.9 10.9* 14.7* 14.0* 11.5*  NEUTROABS 7.5  --   --   --   --   --   --   HGB 12.2*  < > 10.3* 9.8* 9.4* 10.1* 10.5*  HCT 37.9*  < > 31.9* 30.6* 29.0* 31.4* 33.3*  MCV 84.6  < > 82.9 84.3 82.4 81.6 83.0  PLT 111*  < > 93* 77* 65* 87* 107*  < > = values in this interval not displayed. Cardiac Enzymes:  Recent Labs Lab 01/27/2015 1926  02/10/15 1455 02/10/15 2119 02/15/15 1226 02/15/15 1826 02/15/15 2305  CKTOTAL 168  --   --   --   --   --   --   CKMB 4.3  --   --   --   --   --   --   TROPONINI 0.04*  < > 0.08* 0.08* 0.21* 0.18* 0.19*  < > = values in this interval not displayed. BNP (last 3 results) No results for input(s): BNP in the last 8760 hours.  ProBNP (last 3 results) No results for input(s): PROBNP in the last 8760 hours.  CBG:  Recent Labs Lab 02/11/15 0335 02/11/15 2446  GLUCAP 107* 98    Recent Results (from the past 240 hour(s))  Culture, blood (routine x 2)     Status: None (Preliminary result)   Collection Time: 02/10/15 10:07 PM  Result Value Ref Range Status   Specimen Description BLOOD LEFT ANTECUBITAL  Final   Special Requests BOTTLES DRAWN AEROBIC ONLY 1CC  Final   Culture NO GROWTH 4 DAYS  Final   Report Status PENDING  Incomplete  Culture, blood (routine x 2)     Status: None (Preliminary result)   Collection Time: 02/10/15 10:17 PM  Result Value Ref Range Status   Specimen Description BLOOD LEFT HAND  Final   Special Requests BOTTLES DRAWN AEROBIC ONLY 5CC  Final   Culture NO GROWTH 4 DAYS  Final   Report Status PENDING  Incomplete  Urine culture     Status: None   Collection Time: 02/11/15  9:44 AM  Result Value Ref Range Status    Specimen Description URINE, CLEAN CATCH  Final   Special Requests NONE  Final   Culture NO GROWTH 1 DAY  Final   Report Status 02/12/2015 FINAL  Final  MRSA PCR Screening     Status: None   Collection Time: 02/15/15  3:41 PM  Result Value Ref Range Status   MRSA by PCR NEGATIVE NEGATIVE Final    Comment:        The GeneXpert MRSA Assay (FDA approved for NASAL specimens only), is one component of a comprehensive MRSA colonization surveillance program. It is not intended to diagnose MRSA infection nor to guide or monitor treatment for MRSA infections.      Studies: Dg Chest Port 1 View  02/15/2015   CLINICAL DATA:  Confused.  Short of breath.  Follow-up exam.  EXAM: PORTABLE CHEST - 1 VIEW  COMPARISON:  02/13/2015  FINDINGS: Study is partly degraded by motion. There is bilateral interstitial thickening more evident in the perihilar and lower lungs. Although this appears increased from the prior exam, this may be due to the respiratory motion only. The possibility of superimposed interstitial edema should be considered given the history of shortness of breath  Mild enlargement of the cardiopericardial silhouette is stable. No mediastinal or hilar masses. No pneumothorax.  IMPRESSION: 1. Exam somewhat limited by motion. Possible interstitial edema superimposed on known emphysema and chronic interstitial thickening.   Electronically Signed   By: Amie Portland M.D.   On: 02/15/2015 11:58   US Abdomen Limited Ruq  02/15/2015   CLINICAL DATA:  Cirrhosis.  EXAM: US ABDOMEN LIMITED - RIGHT UPPER QUADRANT  COMPARISON:  None.  FINDINGS: Gallbladder:  Distended, hydropic gallbladder noted. Gallbladder wall thickness measures 2.3 mm. No pericholecystic fluid. Negative sonographic Murphy's sign.  Common bile duct:  Diameter: 3.0  Liver:  The liver is diffusely echogenic with a nodular contour or compatible with cirrhosis.  IMPRESSION: 1. Morphologic features of liver compatible with cirrhosis. 2. Hydropic  gallbladder without evidence for gallbladder wall thickening.   Electronically Signed   By: Signa Kell M.D.   On: 02/15/2015 19:21    Scheduled Meds: . aspirin EC  81 mg Oral QHS  . atorvastatin  80 mg Oral q1800  . clopidogrel  75 mg Oral QPC supper  . docusate sodium  100 mg Oral BID  . ipratropium  0.5 mg Nebulization Q6H  . levalbuterol  0.63 mg Nebulization Q6H  . levothyroxine  100 mcg Oral QAC breakfast  . multivitamin with minerals  1 tablet Oral Daily  .  omega-3 acid ethyl esters  1 g Oral Q supper  . pantoprazole  40 mg Oral BID  . piperacillin-tazobactam (ZOSYN)  IV  3.375 g Intravenous Q8H  . pneumococcal 23 valent vaccine  0.5 mL Intramuscular Tomorrow-1000  . potassium chloride  40 mEq Oral Once  . predniSONE  10 mg Oral Q breakfast  . vancomycin  1,000 mg Intravenous Q24H   Continuous Infusions: . amiodarone 60 mg/hr (02/16/15 6834)   Followed by  . amiodarone      Principal Problem:   S/P shoulder replacement Active Problems:   Acute respiratory failure with hypoxia   HCAP (healthcare-associated pneumonia)   CAD S/P LAD/OM PCI '07, LAD ISR-DES 02/2012   Hypertension   Chronic renal insufficiency, stage III (moderate)   Rheumatoid arthritis   Acute diastolic heart failure   Severe sepsis   Dysphagia   Effect of immunosuppressant therapy   Demand ischemia   Sinus tachycardia   New onset a-fib    Time spent: 91 MINS    [facility] MD [facility] Pager [phone]. If 7PM-7AM, please contact night-coverage at [email or web address], password [facility] 02/16/2015, 8:53 AM  LOS: 7 days            2

## 2015-02-16 NOTE — Progress Notes (Addendum)
Shift event: Pt having tachycardia, not sustaining for part of shift. This was previously documented in notes. However, tonight, his HR peaked in the 180s sustained and 12 lead EKG showed Afib with RVR. NP to bedside. BP was in the 80s upon arrival to room. Pt is very lethargic (has had MSO4 and Ativan tonight) (baseline per chart is awake, alert but confused). He appears to be using accessory muscles but not in distress. RT in room and says since the pain medication, he is breathing a lot better. Venous gas is good. O2 sats 99-100% on 50% VM which he has been on for about 12 hours.  Pt appears frail and acutely ill but no distress. Spontaneously moves extremities from time to time. Skin is slightly jaundice. Card: irreg irreg. Resp: minor use of accessory muscles. Rate 23. O2 sat normal.  +Afib with RVR-do not see a hx of this on the chart. It appears he has been in ST with PVCs during this admission. BP low, so will start amiodarone drip. After bolus saline and load of amio, HR coming down to the 140s already. +hypotension-1L bolus with success. BP 112/67.  +respiratory failure-not new, doing OK on VM.  +AKI on CKD III-creat bumped a tad. Bolus should help.  +CAD-troponins have flattened. Has been followed by cards. On Plavix and ASA.  +sepsis/PNA-WBC improved. On Abx and supportive care.  +electrolyte abnormalities yesterday-K and phos were low. Were repleted. K and phos normal this am. Mg normal 6/30. +jaundice-being worked up for cirrhosis. Will follow.  Jimmye Norman, NP [facility] BP trending down. Another 500cc bolus ordered. HR 90s.  KJKG, NP

## 2015-02-16 NOTE — Progress Notes (Signed)
OT Cancellation Note  Patient Details Name: [name] [name] MRN: [id number] DOB: [birth year]   Cancelled Treatment:    Reason Eval/Treat Not Completed: Medical issues which prohibited therapy. Pt medically unstable, currently on bipap. RN requested UE ROM be deferred today. Will continue to follow.  Evern Bio 02/16/2015, 10:47 AM  [phone]

## 2015-02-16 NOTE — Progress Notes (Signed)
PT Cancellation Note  Patient Details Name: [name] [name] MRN: [id number] DOB: [birth year]   Cancelled Treatment:    Reason Eval/Treat Not Completed: Medical issues which prohibited therapy (pt currently on bipap and not medically stable)   [name] [name] 02/16/2015, 10:23 AM Delaney Meigs, PT [phone]

## 2015-02-16 NOTE — Progress Notes (Signed)
   Subjective: 7 Days Post-Op Procedure(s) (LRB): RIGHT REVERSE SHOULDER TOTAL ARTHROPLASTY (Right)  Pt having runs of tachycardia and recent Afib Internal medicine seen several hours ago Resting currently with some tachycardia Patient reports pain as mild.  Objective:   VITALS:   Filed Vitals:   02/16/15 0400  BP: 80/49  Pulse: 99  Temp: 97.8 F (36.6 C)  Resp: 24   Right shoulder incision healing well nv intact distally Minimal edema to right elbow Sling in place  LABS  Recent Labs  02/14/15 0233 02/15/15 0512 02/16/15 0145  HGB 9.4* 10.1* 10.5*  HCT 29.0* 31.4* 33.3*  WBC 14.7* 14.0* 11.5*  PLT 65* 87* 107*     Recent Labs  02/14/15 0233 02/15/15 0512 02/16/15 0145  NA 137 137 141  K 3.7 3.4* 3.6  BUN 25* 24* 22*  CREATININE 1.57* 1.42* 1.76*  GLUCOSE 113* 135* 140*     Assessment/Plan: 7 Days Post-Op Procedure(s) (LRB): RIGHT REVERSE SHOULDER TOTAL ARTHROPLASTY (Right) Appreciate medical team management  Continue IV antibiotics for suspected pneumonia Will continue to monitor his heart rate recent development of Afib Will need SNF placement once medically able to discharge PT/OT as able Continue O2 support with venturi vs cannula depending on sat %   [employer], MPAS, PA-C  02/16/2015, 6:21 AM

## 2015-02-16 DEATH — deceased

## 2015-02-17 LAB — CBC WITH DIFFERENTIAL/PLATELET
BASOS PCT: 1 % (ref 0–1)
Basophils Absolute: 0.1 10*3/uL (ref 0.0–0.1)
Eosinophils Absolute: 0.3 10*3/uL (ref 0.0–0.7)
Eosinophils Relative: 3 % (ref 0–5)
HCT: 32.6 % — ABNORMAL LOW (ref 39.0–52.0)
HEMOGLOBIN: 10.1 g/dL — AB (ref 13.0–17.0)
LYMPHS ABS: 1 10*3/uL (ref 0.7–4.0)
Lymphocytes Relative: 10 % — ABNORMAL LOW (ref 12–46)
MCH: 26.4 pg (ref 26.0–34.0)
MCHC: 31 g/dL (ref 30.0–36.0)
MCV: 85.1 fL (ref 78.0–100.0)
MONOS PCT: 11 % (ref 3–12)
Monocytes Absolute: 1.2 10*3/uL — ABNORMAL HIGH (ref 0.1–1.0)
NEUTROS PCT: 75 % (ref 43–77)
Neutro Abs: 7.9 10*3/uL — ABNORMAL HIGH (ref 1.7–7.7)
Platelets: 127 10*3/uL — ABNORMAL LOW (ref 150–400)
RBC: 3.83 MIL/uL — AB (ref 4.22–5.81)
RDW: 19.1 % — AB (ref 11.5–15.5)
WBC: 10.4 10*3/uL (ref 4.0–10.5)

## 2015-02-17 LAB — COMPREHENSIVE METABOLIC PANEL
ALK PHOS: 91 U/L (ref 38–126)
ALT: 14 U/L — AB (ref 17–63)
AST: 48 U/L — ABNORMAL HIGH (ref 15–41)
Albumin: 2.1 g/dL — ABNORMAL LOW (ref 3.5–5.0)
Anion gap: 11 (ref 5–15)
BUN: 25 mg/dL — ABNORMAL HIGH (ref 6–20)
CALCIUM: 7.9 mg/dL — AB (ref 8.9–10.3)
CO2: 23 mmol/L (ref 22–32)
Chloride: 108 mmol/L (ref 101–111)
Creatinine, Ser: 1.85 mg/dL — ABNORMAL HIGH (ref 0.61–1.24)
GFR calc non Af Amer: 33 mL/min — ABNORMAL LOW (ref >60)
GFR, EST AFRICAN AMERICAN: 39 mL/min — AB (ref >60)
Glucose, Bld: 107 mg/dL — ABNORMAL HIGH (ref 65–99)
POTASSIUM: 3.9 mmol/L (ref 3.5–5.1)
SODIUM: 142 mmol/L (ref 135–145)
Total Bilirubin: 1.3 mg/dL — ABNORMAL HIGH (ref 0.3–1.2)
Total Protein: 6 g/dL — ABNORMAL LOW (ref 6.5–8.1)

## 2015-02-17 LAB — MAGNESIUM: Magnesium: 1.7 mg/dL (ref 1.7–2.4)

## 2015-02-17 MED ORDER — CHLORHEXIDINE GLUCONATE 0.12 % MT SOLN
15.0000 mL | Freq: Two times a day (BID) | OROMUCOSAL | Status: DC
  Filled 2015-02-17 (×3): qty 15

## 2015-02-17 MED ORDER — CETYLPYRIDINIUM CHLORIDE 0.05 % MT LIQD: 7.0000 mL | Freq: Two times a day (BID) | OROMUCOSAL | Status: DC

## 2015-02-17 MED ORDER — MORPHINE SULFATE 2 MG/ML IJ SOLN
2.0000 mg | INTRAMUSCULAR | Status: DC | PRN
  Administered 2015-02-17: 2 mg via INTRAVENOUS

## 2015-02-17 MED ORDER — MORPHINE SULFATE 2 MG/ML IJ SOLN
INTRAMUSCULAR | Status: AC
  Filled 2015-02-17: qty 1

## 2015-03-05 NOTE — Discharge Summary (Signed)
Physician Discharge Summary   Patient ID: [name] [name] MRN: [id number] DOB/AGE: 1935/05/03 80 y.o.  Admit date: 01/25/2015 Discharge date: 02/18/2015  Admission Diagnoses:  Principal Problem:   S/P shoulder replacement Active Problems:   CAD S/P LAD/OM PCI '07, LAD ISR-DES 02/2012   Hypertension   Chronic renal insufficiency, stage III (moderate)   Acute respiratory failure with hypoxia   Rheumatoid arthritis   Acute diastolic heart failure   Severe sepsis   HCAP (healthcare-associated pneumonia)   Dysphagia   Effect of immunosuppressant therapy   Demand ischemia   Sinus tachycardia   New onset a-fib   Palliative care encounter   Discharge Diagnoses:  Same as above Cardiopulmonary arrest   Surgeries: Procedure(s): RIGHT REVERSE SHOULDER TOTAL ARTHROPLASTY on 02/02/2015   Consultants: critical care, cardiology, PT/OT  Discharged Condition: deceased  Hospital Course: [name] [name] is an 80 y.o. male who was admitted 02/13/2015 with a chief complaint of right shoulder pain and weakness, and found to have a diagnosis of right rotator cuff arthropathy.  They were brought to the operating room on 02/01/2015 and underwent the above named procedures.    After surgery pt developed community acquired pneumonia with an exacerbation of COPD. He was admitted to the ICU to help decrease his hypoxia and monitored closely by the critical care team. He developed new onset A-fib along with his worsening respiratory status. Even with supplemental oxygen by nasal cannula and venturi mask his O2 sats varied. Unfortunately, he underwent a cardiopulmonary arrest on the morning of 02/18/2015.  Recent vital signs:  Filed Vitals:   2015-03-18 0424  BP:   Pulse: 101  Temp:   Resp: 27    Recent laboratory studies:  Results for orders placed or performed during the hospital encounter of 01/28/2015  Culture, blood (routine x 2)  Result Value Ref Range   Specimen Description BLOOD LEFT ANTECUBITAL    Special Requests BOTTLES DRAWN AEROBIC ONLY 1CC    Culture NO GROWTH 5 DAYS    Report Status 02/16/2015 FINAL   Culture, blood (routine x 2)  Result Value Ref Range   Specimen Description BLOOD LEFT HAND    Special Requests BOTTLES DRAWN AEROBIC ONLY 5CC    Culture NO GROWTH 5 DAYS    Report Status 02/16/2015 FINAL   Urine culture  Result Value Ref Range   Specimen Description URINE, CLEAN CATCH    Special Requests NONE    Culture NO GROWTH 1 DAY    Report Status 02/12/2015 FINAL   MRSA PCR Screening  Result Value Ref Range   MRSA by PCR NEGATIVE NEGATIVE  Hemoglobin and hematocrit, blood  Result Value Ref Range   Hemoglobin 11.1 (L) 13.0 - 17.0 g/dL   HCT 24.0 (L) 97.3 - 53.2 %  Basic metabolic panel  Result Value Ref Range   Sodium 137 135 - 145 mmol/L   Potassium 3.6 3.5 - 5.1 mmol/L   Chloride 104 101 - 111 mmol/L   CO2 22 22 - 32 mmol/L   Glucose, Bld 87 65 - 99 mg/dL   BUN 28 (H) 6 - 20 mg/dL   Creatinine, Ser 9.92 (H) 0.61 - 1.24 mg/dL   Calcium 7.4 (L) 8.9 - 10.3 mg/dL   GFR calc non Af Amer 26 (L) >60 mL/min   GFR calc Af Amer 30 (L) >60 mL/min   Anion gap 11 5 - 15  Blood gas, arterial  Result Value Ref Range   FIO2 1.00 %   Delivery systems OXYGEN MASK  pH, Arterial 7.423 7.350 - 7.450   pCO2 arterial 32.2 (L) 35.0 - 45.0 mmHg   pO2, Arterial 78.7 (L) 80.0 - 100.0 mmHg   Bicarbonate 20.6 20.0 - 24.0 mEq/L   TCO2 21.6 0 - 100 mmol/L   Acid-base deficit 3.1 (H) 0.0 - 2.0 mmol/L   O2 Saturation 95.7 %   Patient temperature 98.6    Collection site LEFT RADIAL    Drawn by [id number]    Sample type ARTERIAL DRAW    Allens test (pass/fail) PASS PASS  CK total and CKMB (cardiac)not at [facility]  Result Value Ref Range   Total CK 168 49 - 397 U/L   CK, MB 4.3 0.5 - 5.0 ng/mL   Relative Index 2.6 (H) 0.0 - 2.5  Troponin I (q 6hr x 3)  Result Value Ref Range   Troponin I 0.04 (H) <0.031 ng/mL  Troponin I (q 6hr x 3)  Result Value Ref Range   Troponin I 0.08 (H)  <0.031 ng/mL  Troponin I (q 6hr x 3)  Result Value Ref Range   Troponin I 0.11 (H) <0.031 ng/mL  Troponin I (q 6hr x 3)  Result Value Ref Range   Troponin I 0.12 (H) <0.031 ng/mL  Troponin I (q 6hr x 3)  Result Value Ref Range   Troponin I 0.08 (H) <0.031 ng/mL  Troponin I (q 6hr x 3)  Result Value Ref Range   Troponin I 0.08 (H) <0.031 ng/mL  Blood gas, arterial  Result Value Ref Range   FIO2 1.00 %   Delivery systems NON-REBREATHER OXYGEN MASK    pH, Arterial 7.421 7.350 - 7.450   pCO2 arterial 37.1 35.0 - 45.0 mmHg   pO2, Arterial 74.3 (L) 80.0 - 100.0 mmHg   Bicarbonate 23.1 20.0 - 24.0 mEq/L   TCO2 24.2 0 - 100 mmol/L   Acid-base deficit 0.4 0.0 - 2.0 mmol/L   O2 Saturation 94.7 %   Patient temperature 102.0    Collection site LEFT RADIAL    Drawn by [id number]    Sample type ARTERIAL DRAW    Allens test (pass/fail) PASS PASS  Lactic acid, plasma  Result Value Ref Range   Lactic Acid, Venous 1.5 0.5 - 2.0 mmol/L  CBC with Differential/Platelet  Result Value Ref Range   WBC 10.6 (H) 4.0 - 10.5 K/uL   RBC 4.48 4.22 - 5.81 MIL/uL   Hemoglobin 12.2 (L) 13.0 - 17.0 g/dL   HCT 81.1 (L) 91.4 - 78.2 %   MCV 84.6 78.0 - 100.0 fL   MCH 27.2 26.0 - 34.0 pg   MCHC 32.2 30.0 - 36.0 g/dL   RDW 95.6 (H) 21.3 - 08.6 %   Platelets 111 (L) 150 - 400 K/uL   Neutrophils Relative % 71 43 - 77 %   Neutro Abs 7.5 1.7 - 7.7 K/uL   Lymphocytes Relative 7 (L) 12 - 46 %   Lymphs Abs 0.7 0.7 - 4.0 K/uL   Monocytes Relative 20 (H) 3 - 12 %   Monocytes Absolute 2.2 (H) 0.1 - 1.0 K/uL   Eosinophils Relative 2 0 - 5 %   Eosinophils Absolute 0.2 0.0 - 0.7 K/uL   Basophils Relative 0 0 - 1 %   Basophils Absolute 0.0 0.0 - 0.1 K/uL  Basic metabolic panel  Result Value Ref Range   Sodium 132 (L) 135 - 145 mmol/L   Potassium 4.1 3.5 - 5.1 mmol/L   Chloride 98 (L) 101 - 111 mmol/L  CO2 22 22 - 32 mmol/L   Glucose, Bld 113 (H) 65 - 99 mg/dL   BUN 35 (H) 6 - 20 mg/dL   Creatinine, Ser 0.93 (H)  0.61 - 1.24 mg/dL   Calcium 7.9 (L) 8.9 - 10.3 mg/dL   GFR calc non Af Amer 25 (L) >60 mL/min   GFR calc Af Amer 29 (L) >60 mL/min   Anion gap 12 5 - 15  Basic metabolic panel  Result Value Ref Range   Sodium 133 (L) 135 - 145 mmol/L   Potassium 3.7 3.5 - 5.1 mmol/L   Chloride 102 101 - 111 mmol/L   CO2 22 22 - 32 mmol/L   Glucose, Bld 110 (H) 65 - 99 mg/dL   BUN 31 (H) 6 - 20 mg/dL   Creatinine, Ser 2.67 (H) 0.61 - 1.24 mg/dL   Calcium 7.3 (L) 8.9 - 10.3 mg/dL   GFR calc non Af Amer 29 (L) >60 mL/min   GFR calc Af Amer 33 (L) >60 mL/min   Anion gap 9 5 - 15  Magnesium  Result Value Ref Range   Magnesium 1.6 (L) 1.7 - 2.4 mg/dL  Phosphorus  Result Value Ref Range   Phosphorus 3.9 2.5 - 4.6 mg/dL  CBC  Result Value Ref Range   WBC 9.3 4.0 - 10.5 K/uL   RBC 3.83 (L) 4.22 - 5.81 MIL/uL   Hemoglobin 10.4 (L) 13.0 - 17.0 g/dL   HCT 12.4 (L) 58.0 - 99.8 %   MCV 84.1 78.0 - 100.0 fL   MCH 27.2 26.0 - 34.0 pg   MCHC 32.3 30.0 - 36.0 g/dL   RDW 33.8 (H) 25.0 - 53.9 %   Platelets 100 (L) 150 - 400 K/uL  Hemoglobin A1c  Result Value Ref Range   Hgb A1c MFr Bld 5.7 (H) 4.8 - 5.6 %   Mean Plasma Glucose 117 mg/dL  Glucose, capillary  Result Value Ref Range   Glucose-Capillary 107 (H) 65 - 99 mg/dL  Glucose, capillary  Result Value Ref Range   Glucose-Capillary 98 65 - 99 mg/dL  Basic metabolic panel  Result Value Ref Range   Sodium 133 (L) 135 - 145 mmol/L   Potassium 3.6 3.5 - 5.1 mmol/L   Chloride 101 101 - 111 mmol/L   CO2 22 22 - 32 mmol/L   Glucose, Bld 116 (H) 65 - 99 mg/dL   BUN 24 (H) 6 - 20 mg/dL   Creatinine, Ser 7.67 (H) 0.61 - 1.24 mg/dL   Calcium 7.4 (L) 8.9 - 10.3 mg/dL   GFR calc non Af Amer 34 (L) >60 mL/min   GFR calc Af Amer 39 (L) >60 mL/min   Anion gap 10 5 - 15  CBC  Result Value Ref Range   WBC 8.9 4.0 - 10.5 K/uL   RBC 3.85 (L) 4.22 - 5.81 MIL/uL   Hemoglobin 10.3 (L) 13.0 - 17.0 g/dL   HCT 34.1 (L) 93.7 - 90.2 %   MCV 82.9 78.0 - 100.0 fL    MCH 26.8 26.0 - 34.0 pg   MCHC 32.3 30.0 - 36.0 g/dL   RDW 40.9 (H) 73.5 - 32.9 %   Platelets 93 (L) 150 - 400 K/uL  Basic metabolic panel  Result Value Ref Range   Sodium 134 (L) 135 - 145 mmol/L   Potassium 3.4 (L) 3.5 - 5.1 mmol/L   Chloride 100 (L) 101 - 111 mmol/L   CO2 23 22 - 32 mmol/L   Glucose, Bld  96 65 - 99 mg/dL   BUN 25 (H) 6 - 20 mg/dL   Creatinine, Ser 1.61 (H) 0.61 - 1.24 mg/dL   Calcium 7.1 (L) 8.9 - 10.3 mg/dL   GFR calc non Af Amer 33 (L) >60 mL/min   GFR calc Af Amer 39 (L) >60 mL/min   Anion gap 11 5 - 15  CBC  Result Value Ref Range   WBC 10.9 (H) 4.0 - 10.5 K/uL   RBC 3.63 (L) 4.22 - 5.81 MIL/uL   Hemoglobin 9.8 (L) 13.0 - 17.0 g/dL   HCT 09.6 (L) 04.5 - 40.9 %   MCV 84.3 78.0 - 100.0 fL   MCH 27.0 26.0 - 34.0 pg   MCHC 32.0 30.0 - 36.0 g/dL   RDW 81.1 (H) 91.4 - 78.2 %   Platelets 77 (L) 150 - 400 K/uL  Magnesium  Result Value Ref Range   Magnesium 2.4 1.7 - 2.4 mg/dL  Phosphorus  Result Value Ref Range   Phosphorus 2.8 2.5 - 4.6 mg/dL  Basic metabolic panel  Result Value Ref Range   Sodium 137 135 - 145 mmol/L   Potassium 3.7 3.5 - 5.1 mmol/L   Chloride 101 101 - 111 mmol/L   CO2 25 22 - 32 mmol/L   Glucose, Bld 113 (H) 65 - 99 mg/dL   BUN 25 (H) 6 - 20 mg/dL   Creatinine, Ser 9.56 (H) 0.61 - 1.24 mg/dL   Calcium 7.3 (L) 8.9 - 10.3 mg/dL   GFR calc non Af Amer 41 (L) >60 mL/min   GFR calc Af Amer 47 (L) >60 mL/min   Anion gap 11 5 - 15  CBC  Result Value Ref Range   WBC 14.7 (H) 4.0 - 10.5 K/uL   RBC 3.52 (L) 4.22 - 5.81 MIL/uL   Hemoglobin 9.4 (L) 13.0 - 17.0 g/dL   HCT 21.3 (L) 08.6 - 57.8 %   MCV 82.4 78.0 - 100.0 fL   MCH 26.7 26.0 - 34.0 pg   MCHC 32.4 30.0 - 36.0 g/dL   RDW 46.9 (H) 62.9 - 52.8 %   Platelets 65 (L) 150 - 400 K/uL  Magnesium  Result Value Ref Range   Magnesium 2.2 1.7 - 2.4 mg/dL  Phosphorus  Result Value Ref Range   Phosphorus 2.8 2.5 - 4.6 mg/dL  CBC  Result Value Ref Range   WBC 14.0 (H) 4.0 - 10.5  K/uL   RBC 3.85 (L) 4.22 - 5.81 MIL/uL   Hemoglobin 10.1 (L) 13.0 - 17.0 g/dL   HCT 41.3 (L) 24.4 - 01.0 %   MCV 81.6 78.0 - 100.0 fL   MCH 26.2 26.0 - 34.0 pg   MCHC 32.2 30.0 - 36.0 g/dL   RDW 27.2 (H) 53.6 - 64.4 %   Platelets 87 (L) 150 - 400 K/uL  Magnesium  Result Value Ref Range   Magnesium 1.9 1.7 - 2.4 mg/dL  Phosphorus  Result Value Ref Range   Phosphorus 1.7 (L) 2.5 - 4.6 mg/dL  Protime-INR  Result Value Ref Range   Prothrombin Time 15.0 11.6 - 15.2 seconds   INR 1.16 0.00 - 1.49  Comprehensive metabolic panel  Result Value Ref Range   Sodium 137 135 - 145 mmol/L   Potassium 3.4 (L) 3.5 - 5.1 mmol/L   Chloride 106 101 - 111 mmol/L   CO2 24 22 - 32 mmol/L   Glucose, Bld 135 (H) 65 - 99 mg/dL   BUN 24 (H) 6 - 20  mg/dL   Creatinine, Ser 1.61 (H) 0.61 - 1.24 mg/dL   Calcium 7.6 (L) 8.9 - 10.3 mg/dL   Total Protein 6.2 (L) 6.5 - 8.1 g/dL   Albumin 2.3 (L) 3.5 - 5.0 g/dL   AST 77 (H) 15 - 41 U/L   ALT 14 (L) 17 - 63 U/L   Alkaline Phosphatase 95 38 - 126 U/L   Total Bilirubin 1.9 (H) 0.3 - 1.2 mg/dL   GFR calc non Af Amer 46 (L) >60 mL/min   GFR calc Af Amer 53 (L) >60 mL/min   Anion gap 7 5 - 15  Troponin I (q 6hr x 3)  Result Value Ref Range   Troponin I 0.21 (H) <0.031 ng/mL  Troponin I (q 6hr x 3)  Result Value Ref Range   Troponin I 0.18 (H) <0.031 ng/mL  Troponin I (q 6hr x 3)  Result Value Ref Range   Troponin I 0.19 (H) <0.031 ng/mL  Blood gas, arterial  Result Value Ref Range   FIO2 0.50 %   Delivery systems VENTURI MASK    pH, Arterial 7.488 (H) 7.350 - 7.450   pCO2 arterial 31.0 (L) 35.0 - 45.0 mmHg   pO2, Arterial 70.9 (L) 80.0 - 100.0 mmHg   Bicarbonate 23.3 20.0 - 24.0 mEq/L   TCO2 24.2 0 - 100 mmol/L   Acid-Base Excess 0.2 0.0 - 2.0 mmol/L   O2 Saturation 94.7 %   Patient temperature 98.6    Collection site RIGHT RADIAL    Drawn by [id number]    Sample type ARTERIAL DRAW    Allens test (pass/fail) PASS PASS  TSH  Result Value Ref Range     TSH 1.142 0.350 - 4.500 uIU/mL  CBC  Result Value Ref Range   WBC 11.5 (H) 4.0 - 10.5 K/uL   RBC 4.01 (L) 4.22 - 5.81 MIL/uL   Hemoglobin 10.5 (L) 13.0 - 17.0 g/dL   HCT 40.9 (L) 81.1 - 91.4 %   MCV 83.0 78.0 - 100.0 fL   MCH 26.2 26.0 - 34.0 pg   MCHC 31.5 30.0 - 36.0 g/dL   RDW 78.2 (H) 95.6 - 21.3 %   Platelets 107 (L) 150 - 400 K/uL  Basic metabolic panel  Result Value Ref Range   Sodium 141 135 - 145 mmol/L   Potassium 3.6 3.5 - 5.1 mmol/L   Chloride 105 101 - 111 mmol/L   CO2 24 22 - 32 mmol/L   Glucose, Bld 140 (H) 65 - 99 mg/dL   BUN 22 (H) 6 - 20 mg/dL   Creatinine, Ser 0.86 (H) 0.61 - 1.24 mg/dL   Calcium 7.7 (L) 8.9 - 10.3 mg/dL   GFR calc non Af Amer 35 (L) >60 mL/min   GFR calc Af Amer 41 (L) >60 mL/min   Anion gap 12 5 - 15  Phosphorus  Result Value Ref Range   Phosphorus 3.1 2.5 - 4.6 mg/dL  Blood gas, venous  Result Value Ref Range   FIO2 0.50 %   Delivery systems OXYGEN MASK    pH, Ven 7.421 (H) 7.250 - 7.300   pCO2, Ven 36.9 (L) 45.0 - 50.0 mmHg   pO2, Ven 51.4 (H) 30.0 - 45.0 mmHg   Bicarbonate 23.5 20.0 - 24.0 mEq/L   TCO2 24.7 0 - 100 mmol/L   Acid-base deficit 0.4 0.0 - 2.0 mmol/L   O2 Saturation 87.4 %   Patient temperature 98.6    Collection site VEIN    Drawn  by [id number]    Sample type VEIN   Magnesium  Result Value Ref Range   Magnesium 2.0 1.7 - 2.4 mg/dL  Blood gas, arterial  Result Value Ref Range   FIO2 0.60 %   Delivery systems BILEVEL POSITIVE AIRWAY PRESSURE    Inspiratory PAP 16    Expiratory PAP 5.0    pH, Arterial 7.370 7.350 - 7.450   pCO2 arterial 40.8 35.0 - 45.0 mmHg   pO2, Arterial 161 (H) 80.0 - 100.0 mmHg   Bicarbonate 23.0 20.0 - 24.0 mEq/L   TCO2 24.2 0 - 100 mmol/L   Acid-base deficit 1.5 0.0 - 2.0 mmol/L   O2 Saturation 99.2 %   Patient temperature 98.6    Collection site LEFT RADIAL    Drawn by [id number]    Sample type ARTERIAL DRAW    Allens test (pass/fail) PASS PASS  Comprehensive metabolic panel  Result  Value Ref Range   Sodium 142 135 - 145 mmol/L   Potassium 3.9 3.5 - 5.1 mmol/L   Chloride 108 101 - 111 mmol/L   CO2 23 22 - 32 mmol/L   Glucose, Bld 107 (H) 65 - 99 mg/dL   BUN 25 (H) 6 - 20 mg/dL   Creatinine, Ser 9.56 (H) 0.61 - 1.24 mg/dL   Calcium 7.9 (L) 8.9 - 10.3 mg/dL   Total Protein 6.0 (L) 6.5 - 8.1 g/dL   Albumin 2.1 (L) 3.5 - 5.0 g/dL   AST 48 (H) 15 - 41 U/L   ALT 14 (L) 17 - 63 U/L   Alkaline Phosphatase 91 38 - 126 U/L   Total Bilirubin 1.3 (H) 0.3 - 1.2 mg/dL   GFR calc non Af Amer 33 (L) >60 mL/min   GFR calc Af Amer 39 (L) >60 mL/min   Anion gap 11 5 - 15  CBC with Differential/Platelet  Result Value Ref Range   WBC 10.4 4.0 - 10.5 K/uL   RBC 3.83 (L) 4.22 - 5.81 MIL/uL   Hemoglobin 10.1 (L) 13.0 - 17.0 g/dL   HCT 21.3 (L) 08.6 - 57.8 %   MCV 85.1 78.0 - 100.0 fL   MCH 26.4 26.0 - 34.0 pg   MCHC 31.0 30.0 - 36.0 g/dL   RDW 46.9 (H) 62.9 - 52.8 %   Platelets 127 (L) 150 - 400 K/uL   Neutrophils Relative % 75 43 - 77 %   Neutro Abs 7.9 (H) 1.7 - 7.7 K/uL   Lymphocytes Relative 10 (L) 12 - 46 %   Lymphs Abs 1.0 0.7 - 4.0 K/uL   Monocytes Relative 11 3 - 12 %   Monocytes Absolute 1.2 (H) 0.1 - 1.0 K/uL   Eosinophils Relative 3 0 - 5 %   Eosinophils Absolute 0.3 0.0 - 0.7 K/uL   Basophils Relative 1 0 - 1 %   Basophils Absolute 0.1 0.0 - 0.1 K/uL  Magnesium  Result Value Ref Range   Magnesium 1.7 1.7 - 2.4 mg/dL    Discharge Medications:     Medication List    STOP taking these medications        levofloxacin 750 MG tablet  Commonly known as:  LEVAQUIN      TAKE these medications        acetaminophen 650 MG CR tablet  Commonly known as:  TYLENOL  Take 650 mg by mouth 2 (two) times daily as needed for pain.     amLODipine 2.5 MG tablet  Commonly known as:  NORVASC  Take 1 tablet (2.5 mg total) by mouth daily.     aspirin EC 81 MG tablet  Take 81 mg by mouth at bedtime.     clopidogrel 75 MG tablet  Commonly known as:  PLAVIX  Take 75  mg by mouth daily after supper.     Etanercept 25 MG/0.5ML Sosy  Inject 25 mg into the skin 2 (two) times a week. Wednesday or Thursday and Saturday or Sunday (ENBREL)     fish oil-omega-3 fatty acids 1000 MG capsule  Take 1 g by mouth daily with supper.     guaiFENesin 600 MG 12 hr tablet  Commonly known as:  MUCINEX  Take 1 tablet (600 mg total) by mouth 2 (two) times daily.     HYDROcodone-acetaminophen 5-325 MG per tablet  Commonly known as:  NORCO  Take 1 tablet by mouth every 6 (six) hours as needed for moderate pain or severe pain.     Ipratropium-Albuterol 20-100 MCG/ACT Aers respimat  Commonly known as:  COMBIVENT RESPIMAT  Inhale 1 puff into the lungs every 6 (six) hours as needed for wheezing or shortness of breath.     isosorbide mononitrate 30 MG 24 hr tablet  Commonly known as:  IMDUR  Take 1 tablet (30 mg total) by mouth daily.     levothyroxine 100 MCG tablet  Commonly known as:  SYNTHROID, LEVOTHROID  Take 100 mcg by mouth daily before breakfast.     multivitamin with minerals Tabs tablet  Take 1 tablet by mouth daily.     nitroGLYCERIN 0.4 MG SL tablet  Commonly known as:  NITROSTAT  Place 1 tablet (0.4 mg total) under the tongue every 5 (five) minutes x 3 doses as needed. For chest pain.     predniSONE 5 MG tablet  Commonly known as:  DELTASONE  Take 1 tablet (5 mg total) by mouth daily with breakfast. To be started after tapering dose achieved.     ranitidine 150 MG tablet  Commonly known as:  ZANTAC  Take 150 mg by mouth daily.     simvastatin 40 MG tablet  Commonly known as:  ZOCOR  Take 40 mg by mouth at bedtime.     traMADol 50 MG tablet  Commonly known as:  ULTRAM  Take 1-2 tablets (50-100 mg total) by mouth every 6 (six) hours as needed for moderate pain.        Diagnostic Studies: Ct Chest High Resolution  02/12/2015   CLINICAL DATA:  Interstitial lung disease, shortness of breath.  EXAM: CT CHEST WITHOUT CONTRAST  TECHNIQUE:  Multidetector CT imaging of the chest was performed following the standard protocol without intravenous contrast. High resolution imaging of the lungs, as well as inspiratory and expiratory imaging, was performed.  COMPARISON:  None.  FINDINGS: Mediastinum/Nodes: Mediastinal lymph nodes are not enlarged by CT size criteria. Hilar regions are difficult to definitively evaluate without IV contrast. No axillary adenopathy. Atherosclerotic calcification of the arterial vasculature, including coronary arteries. Heart is enlarged. No pericardial effusion.  Lungs/Pleura: Severe centrilobular emphysema with paraseptal components. Assessment for interstitial lung disease is somewhat compromised by respiratory motion. Mild collapse/ consolidation in the subpleural right lower lobe. Mild subpleural atelectasis in the left lower lobe. No definitive subpleural reticulation, traction bronchiectasis/bronchiolectasis, ground-glass, architectural distortion or honeycombing. Inspiratory and expiratory imaging appears to be in the same phase, limiting evaluation for air trapping. No pleural fluid. Airway is unremarkable.  Upper abdomen: Low-attenuation lesions in the liver measure up to 1.6 cm in  the right hepatic lobe, difficult to characterize without post-contrast imaging. Liver margin appears slightly irregular. Visualized portions of the adrenal glands and right kidney are unremarkable. 4 mm exophytic low-attenuation lesion off the upper pole left kidney is too small to characterize. Visualized portions of the spleen, pancreas, stomach and bowel are grossly unremarkable. No upper abdominal adenopathy.  Musculoskeletal: No worrisome lytic or sclerotic lesions. Degenerative changes are seen in the spine. Bilateral shoulder arthroplasties. Associated locules of subcutaneous air on the right indicates recent surgery. Healing or healed sternal fracture.  IMPRESSION: 1. Image quality is degraded by respiratory motion. No definite  evidence of interstitial lung disease superimposed on severe emphysema. 2. Collapse/consolidation in the right lower lobe may be due to atelectasis. Pneumonia is not excluded. 3. Three-vessel coronary artery calcification. 4. Cirrhosis. Low-attenuation lesions in the liver cannot be further characterized without post-contrast imaging. Consider MR abdomen without and with contrast in further evaluation, as clinically indicated.   Electronically Signed   By: Leanna Battles M.D.   On: 02/12/2015 13:34   Nm Pulmonary Perf And Vent  02/12/2015   CLINICAL DATA:  Shortness of breath for 2 days post RIGHT shoulder surgery  NUCLEAR MEDICINE VENTILATION - PERFUSION LUNG SCAN  TECHNIQUE: Ventilation images were obtained in multiple projections using inhaled aerosol Tc-5m DTPA. Perfusion images were obtained in multiple projections after intravenous injection of Tc-44m MAA.  RADIOPHARMACEUTICALS:  40.0 mCi Technetium-51m DTPA aerosol inhalation and 6.0 mCi Technetium-101m MAA IV  COMPARISON:  None; correlation made with chest radiograph 02/10/2015 and CT chest of 02/12/2015  FINDINGS: Ventilation: Diminished ventilation at the apices. Corresponding emphysematous changes at both apices on CT. No other definite ventilatory defects.  Perfusion: Photopenic defect RIGHT upper lobe on lateral view corresponding to artifact from RIGHT shoulder prosthesis. Minimal irregularity of perfusion at the posterior lung bases corresponding to lower lobe atelectasis on CT. No other perfusion defects identified.  IMPRESSION: Very low probability for pulmonary embolism.   Electronically Signed   By: Ulyses Southward M.D.   On: 02/12/2015 14:17   Dg Chest Port 1 View  02/15/2015   CLINICAL DATA:  Confused.  Short of breath.  Follow-up exam.  EXAM: PORTABLE CHEST - 1 VIEW  COMPARISON:  02/13/2015  FINDINGS: Study is partly degraded by motion. There is bilateral interstitial thickening more evident in the perihilar and lower lungs. Although this  appears increased from the prior exam, this may be due to the respiratory motion only. The possibility of superimposed interstitial edema should be considered given the history of shortness of breath  Mild enlargement of the cardiopericardial silhouette is stable. No mediastinal or hilar masses. No pneumothorax.  IMPRESSION: 1. Exam somewhat limited by motion. Possible interstitial edema superimposed on known emphysema and chronic interstitial thickening.   Electronically Signed   By: Amie Portland M.D.   On: 02/15/2015 11:58   Dg Chest Port 1 View  02/13/2015   CLINICAL DATA:  Bullous emphysema, coronary artery disease  EXAM: PORTABLE CHEST - 1 VIEW  COMPARISON:  CT scan of the chest dated February 12, 2015  FINDINGS: The lungs are adequately inflated. The interstitial markings are increased diffusely greatest in the mid and lower lung zones. There is no significant pleural effusion. The cardiac silhouette is mildly enlarged. The central pulmonary vascularity is prominent. There is mural calcification in the aortic arch and wall of the descending thoracic aorta. The bony thorax exhibits no acute abnormality. There are bilateral shoulder prostheses.  IMPRESSION: Known emphysema and interstitial lung  disease without focal pneumonia. Mild enlargement of the cardiac silhouette without pulmonary edema.   Electronically Signed   By: David  [place] M.D.   On: 02/13/2015 07:12   Dg Chest Port 1 View  02/11/2015   CLINICAL DATA:  Acute onset of respiratory distress. Initial encounter.  EXAM: PORTABLE CHEST - 1 VIEW  COMPARISON:  Chest radiograph performed 02/10/2015  FINDINGS: Lung expansion is improved. Persistent bibasilar airspace opacities may reflect atelectasis or possibly mild pneumonia, depending on the patient's symptoms. No definite pleural effusion or pneumothorax is seen, though the lung apices are partially obscured by the patient's head.  The cardiomediastinal silhouette is mildly enlarged. No acute osseous  abnormalities are identified. Bilateral shoulder arthroplasties are grossly stable in appearance.  IMPRESSION: Lung expansion is improved. Persistent bibasilar airspace opacities may reflect atelectasis or possibly mild pneumonia, depending on the patient's symptoms. Mild cardiomegaly noted.   Electronically Signed   By: Roanna Raider M.D.   On: 02/11/2015 01:22   Dg Chest Port 1 View  01/17/2015   CLINICAL DATA:  Respiratory distress  EXAM: PORTABLE CHEST - 1 VIEW  COMPARISON:  02/07/2015  FINDINGS: The heart size is enlarged. There is aortic atherosclerosis noted. Small to moderate bilateral pleural effusions are identified left-greater-than-right. There is mild interstitial edema. Bibasilar atelectasis present.  IMPRESSION: 1. CHF. 2. Aortic atherosclerosis. 3. Bibasilar atelectasis.   Electronically Signed   By: Signa Kell M.D.   On: 02/08/2015 19:07   Dg Shoulder Right Port  02/10/2015   CLINICAL DATA:  Post shoulder replacement.  EXAM: PORTABLE RIGHT SHOULDER - 2+ VIEW  COMPARISON:  None.  FINDINGS: Changes of right shoulder replacement. No hardware or bony complicating feature. Degenerative changes at the right [facility] joint with probable resorption of the distal clavicle versus prior resection.  IMPRESSION: Right shoulder replacement.  No complicating feature.   Electronically Signed   By: Charlett Nose M.D.   On: 01/22/2015 11:55   US Abdomen Limited Ruq  02/15/2015   CLINICAL DATA:  Cirrhosis.  EXAM: US ABDOMEN LIMITED - RIGHT UPPER QUADRANT  COMPARISON:  None.  FINDINGS: Gallbladder:  Distended, hydropic gallbladder noted. Gallbladder wall thickness measures 2.3 mm. No pericholecystic fluid. Negative sonographic Murphy's sign.  Common bile duct:  Diameter: 3.0  Liver:  The liver is diffusely echogenic with a nodular contour or compatible with cirrhosis.  IMPRESSION: 1. Morphologic features of liver compatible with cirrhosis. 2. Hydropic gallbladder without evidence for gallbladder wall thickening.    Electronically Signed   By: Signa Kell M.D.   On: 02/15/2015 19:21    Disposition: 20-Expired        Follow-up Information    Follow up with NORRIS,STEVEN R, MD. Call in 2 weeks.   Specialty:  Orthopedic Surgery   Why:  [phone]   Contact information:   [address] [place] [address] [phone]        Signed: Thea Gist 03/05/2015, 9:38 PM

## 2015-03-19 NOTE — Progress Notes (Signed)
Patient pronounced at 0611 on 2015/02/23.  Called by Kristeen Miss RN and April Pugh RN.  Patient was in asystole with no respirations or palpable pulses.

## 2015-03-19 NOTE — Progress Notes (Signed)
Called to patient's room by RN due to decreasing Sp02.  When I arrived patient was on BIPAP 50% FIO2 with sat in mid 80's with paradoxical breathing.  Increased to 80% and asked RN to call Rapid Response.  Rapid assessed patient,  RN called MD and patient was administered morphine.  Returned to room and increased FIO2 to 100%.

## 2015-03-19 NOTE — Progress Notes (Signed)
Called by primary RN to see pt for second set of eyes.  Pt had diaphragmatic resp, gasping breaths, & using accessory muscles.  Pt on bipap.  Morphine given by primary RN for air hunger.  Bipap settings adjusted by Lauren RT to support pt's resp.  Primary RN called family and recommended that they come to the hospital.    Prior to family arriving the pt began to desat and drop his HR steadily.  Asystole & absent heart tones, expired 0611.  Family at bedside within minutes of pt's death.  This RN & primary RN, Kristeen Miss, RN, and Prudy Feeler, RRT at bedside at time of death.

## 2015-03-19 NOTE — Progress Notes (Signed)
   2015/02/23 0700  Clinical Encounter Type  Visited With Family  Visit Type Death  Referral From Nurse  Consult/Referral To Chaplain  Spiritual Encounters  Spiritual Needs Emotional;Grief support;Prayer  Stress Factors  Family Stress Factors Loss;Major life changes  [facility] called to comfort family and offer grief support and prayer.

## 2015-03-19 DEATH — deceased

## 2015-11-11 IMAGING — US US ABDOMEN LIMITED
1 series · 14 of 25 positions shown · non-contrast
Comparison: None.

CLINICAL DATA: Cirrhosis.

EXAM:
US ABDOMEN LIMITED - RIGHT UPPER QUADRANT

[Series 1: us abdomen limited · 0.18mm/px · 14 of 39 slices shown]
[im 1/39]
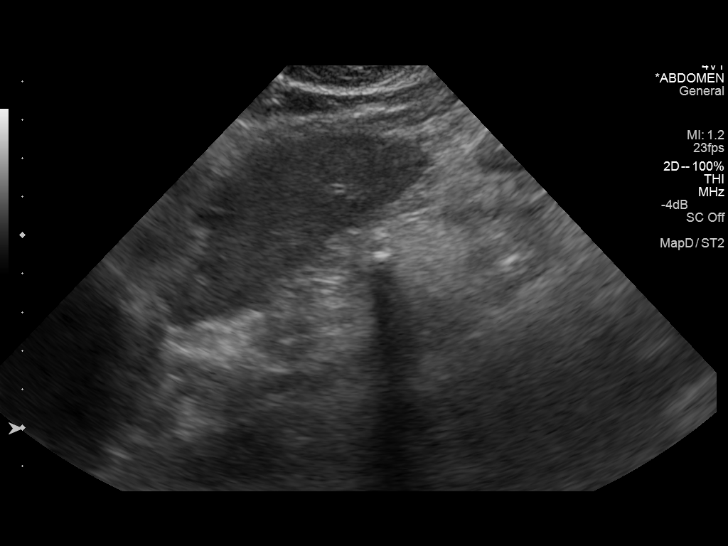
[im 4/39]
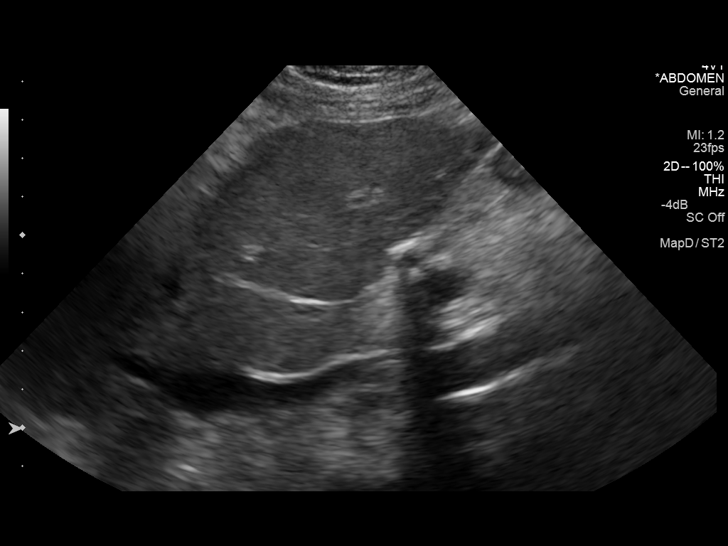
[im 7/39]
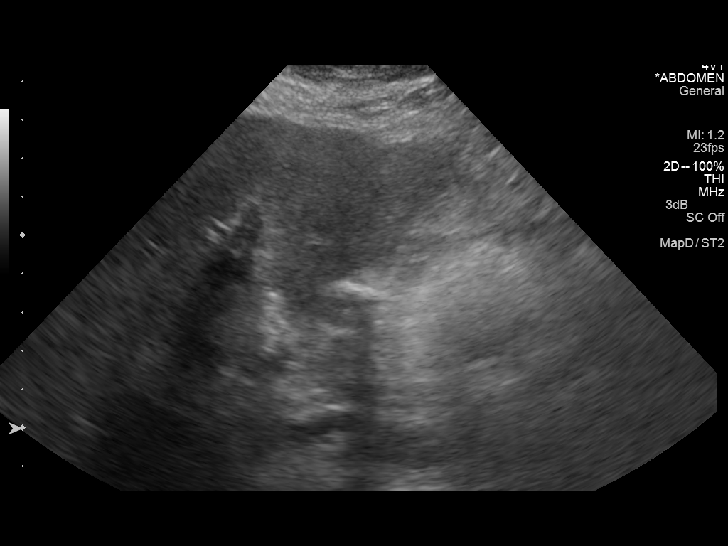
[im 10/39]
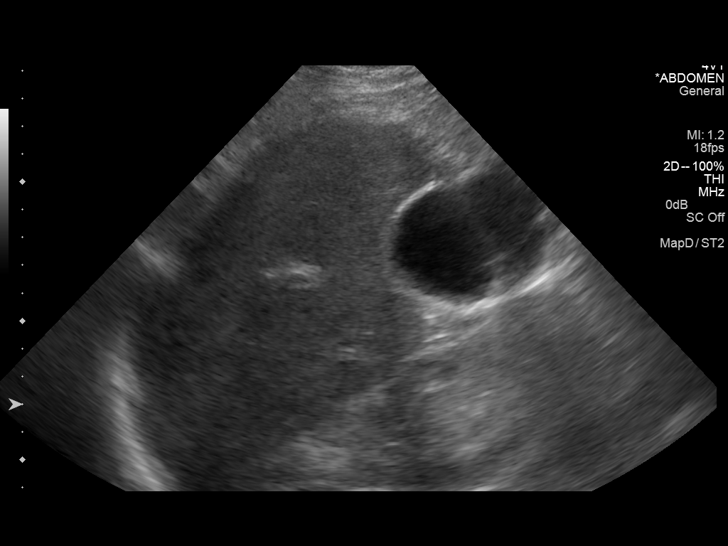
[im 13/39]
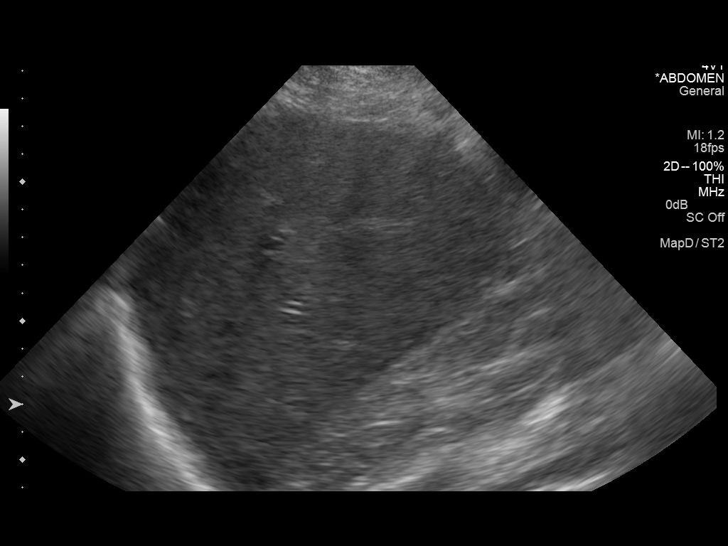
[im 15/39]
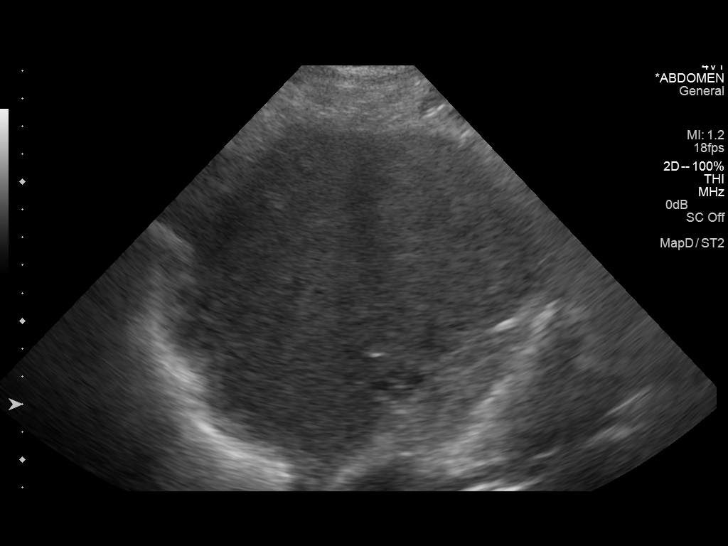
[im 18/39]
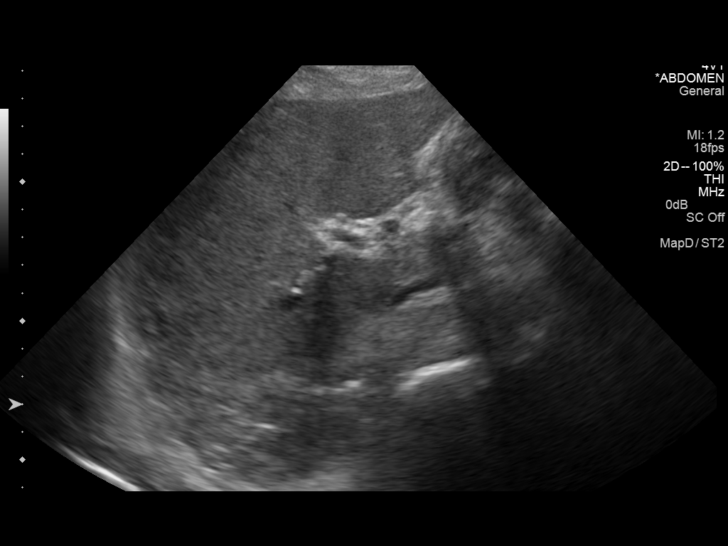
[im 21/39]
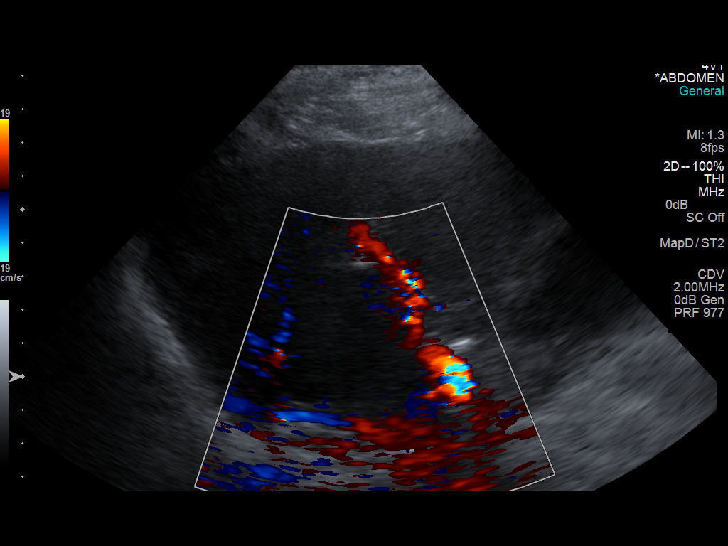
[im 24/39]
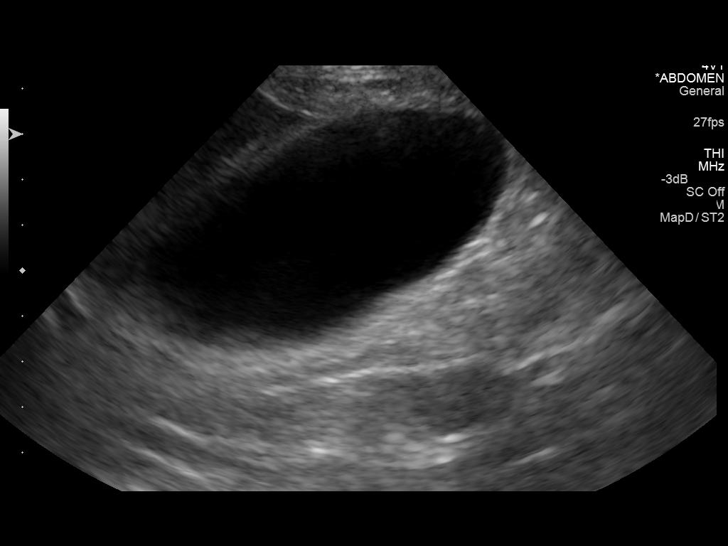
[im 26/39]
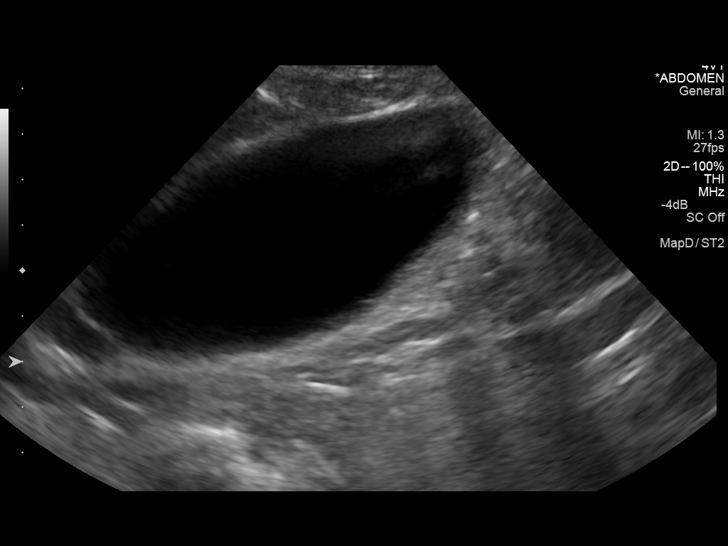
[im 29/39]
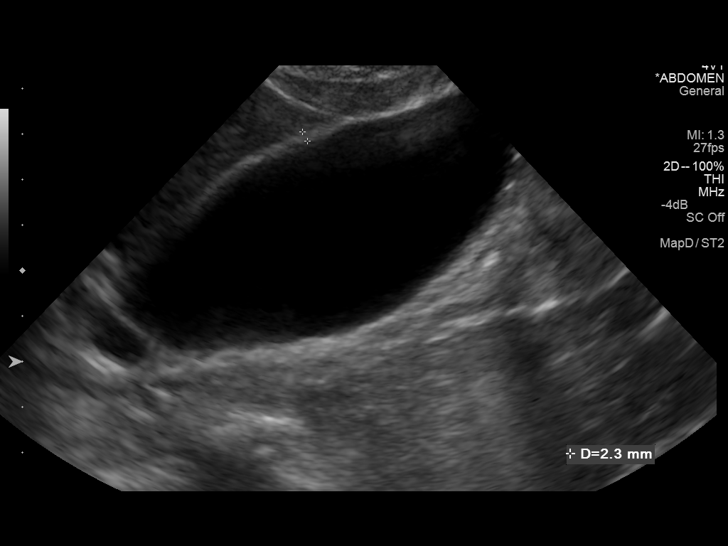
[im 32/39]
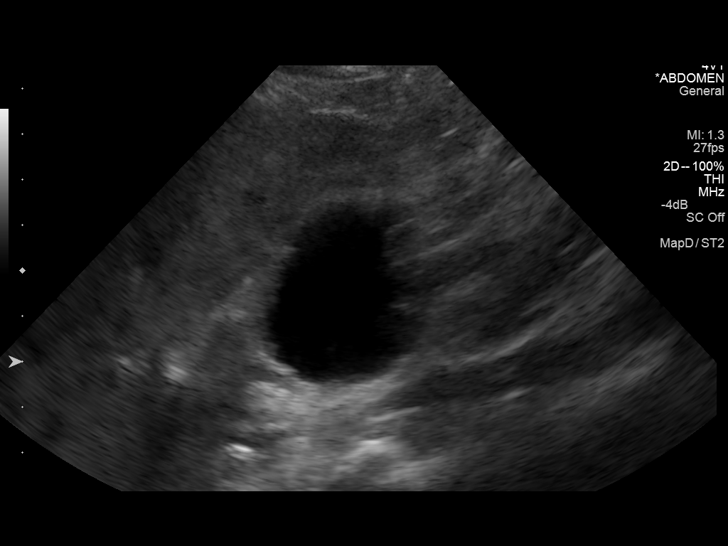
[im 35/39]
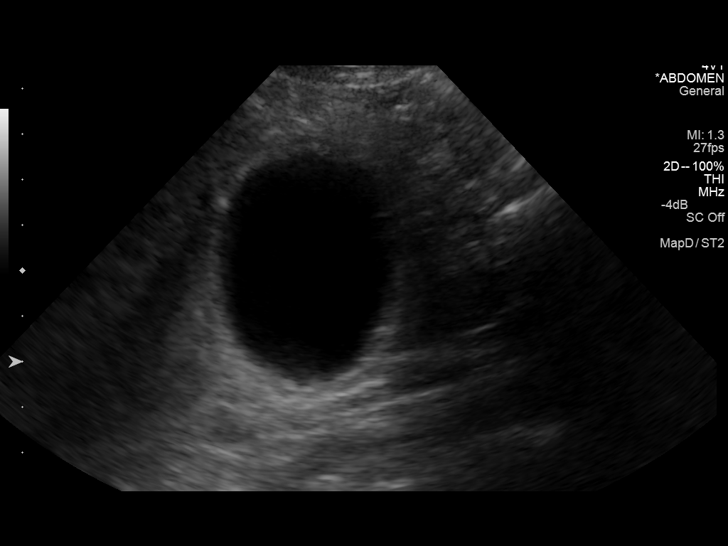
[im 39/39]
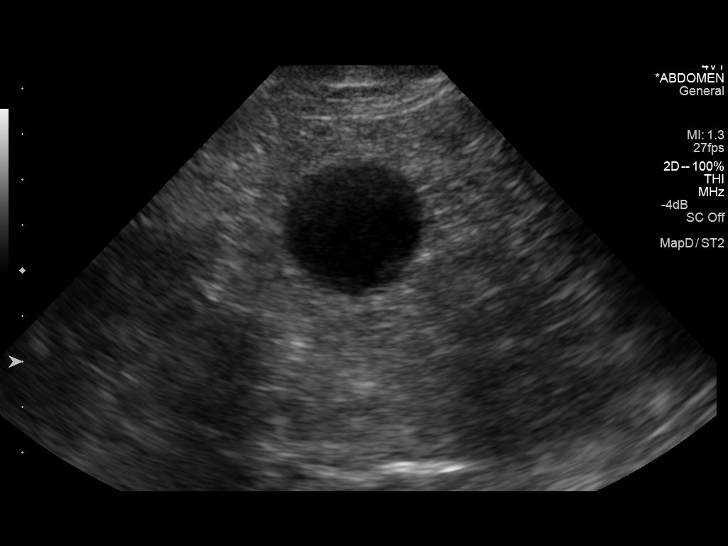

[14 of 25 positions shown; findings below may reference images not displayed]

FINDINGS: Gallbladder:

Distended, hydropic gallbladder noted. Gallbladder wall thickness
measures 2.3 mm. No pericholecystic fluid. Negative sonographic
Murphy's sign.

Common bile duct:

Diameter:

Liver:

The liver is diffusely echogenic with a nodular contour or
compatible with cirrhosis.
IMPRESSION: 1. Morphologic features of liver compatible with cirrhosis.
2. Hydropic gallbladder without evidence for gallbladder wall
thickening.

## 2017-03-27 IMAGING — CR DG CHEST 2V
2 series · 2 of 2 positions shown · non-contrast
Comparison: None.

CLINICAL DATA: Patient with shortness of breath and dizziness.

EXAM:
CHEST  2 VIEW

[PA]
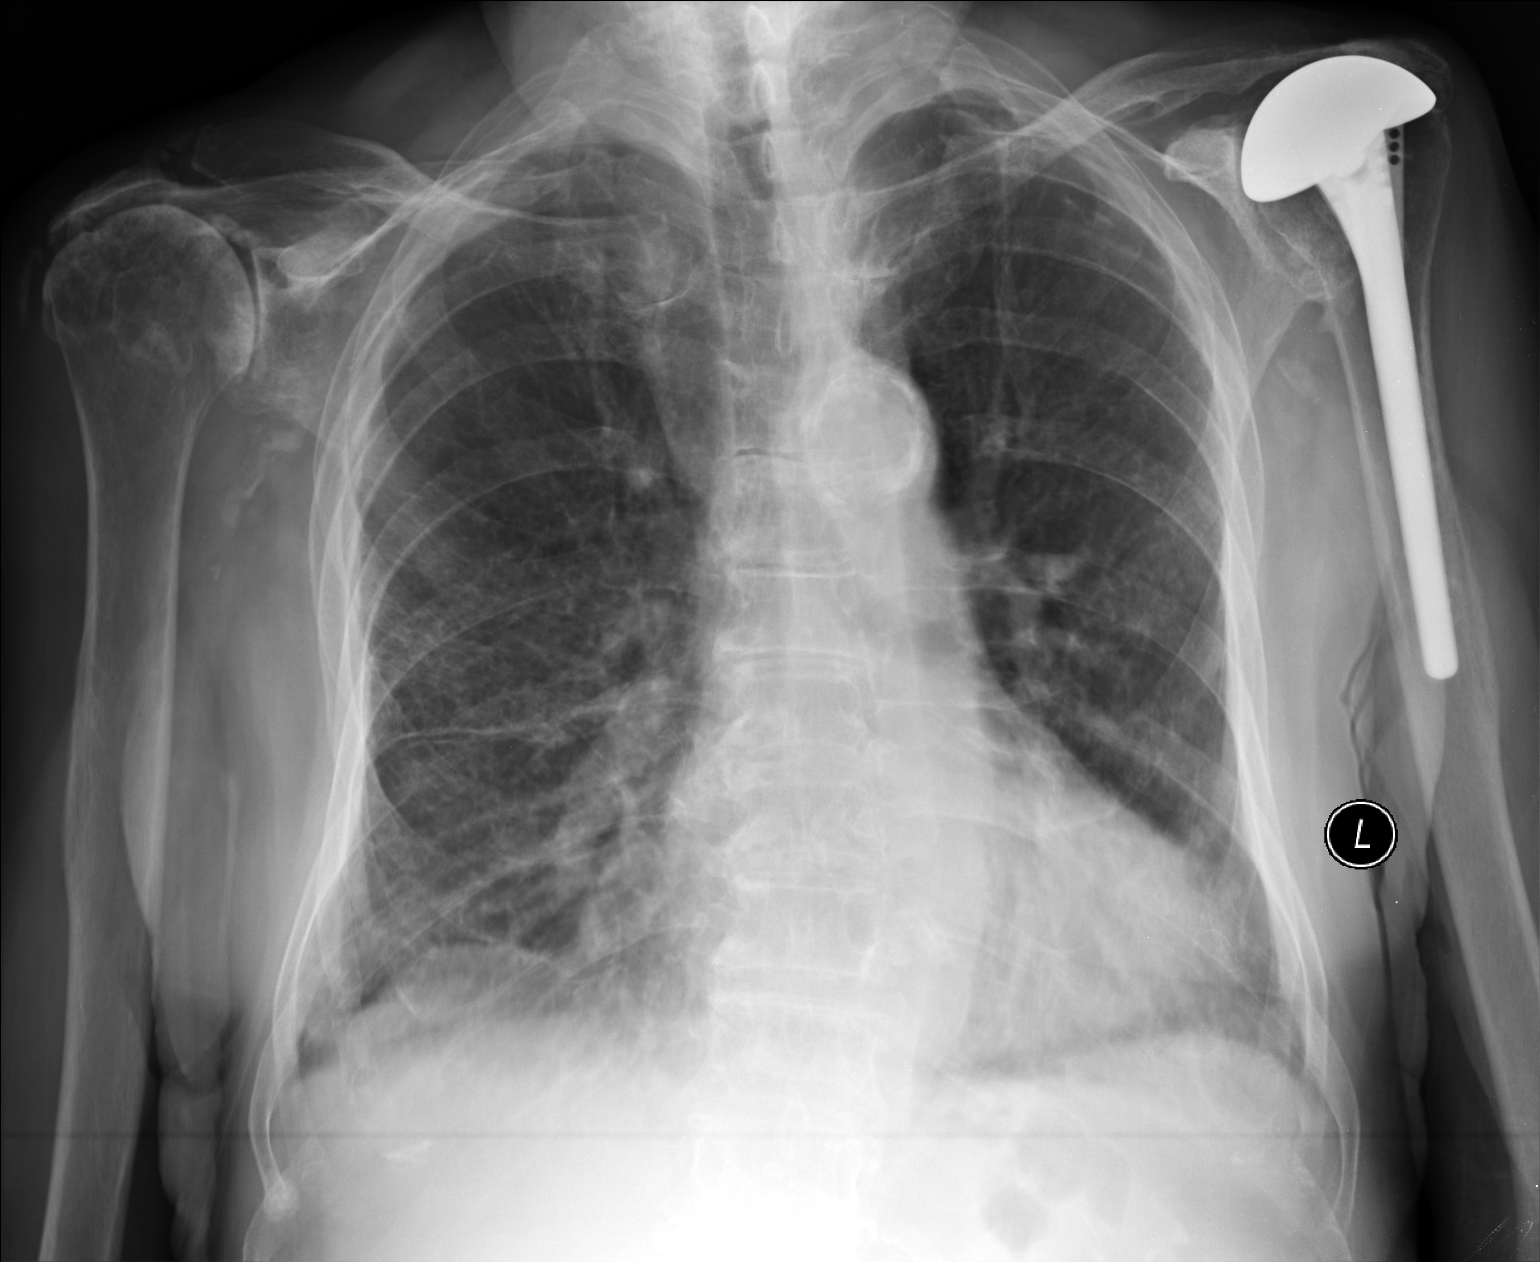

[lateral]
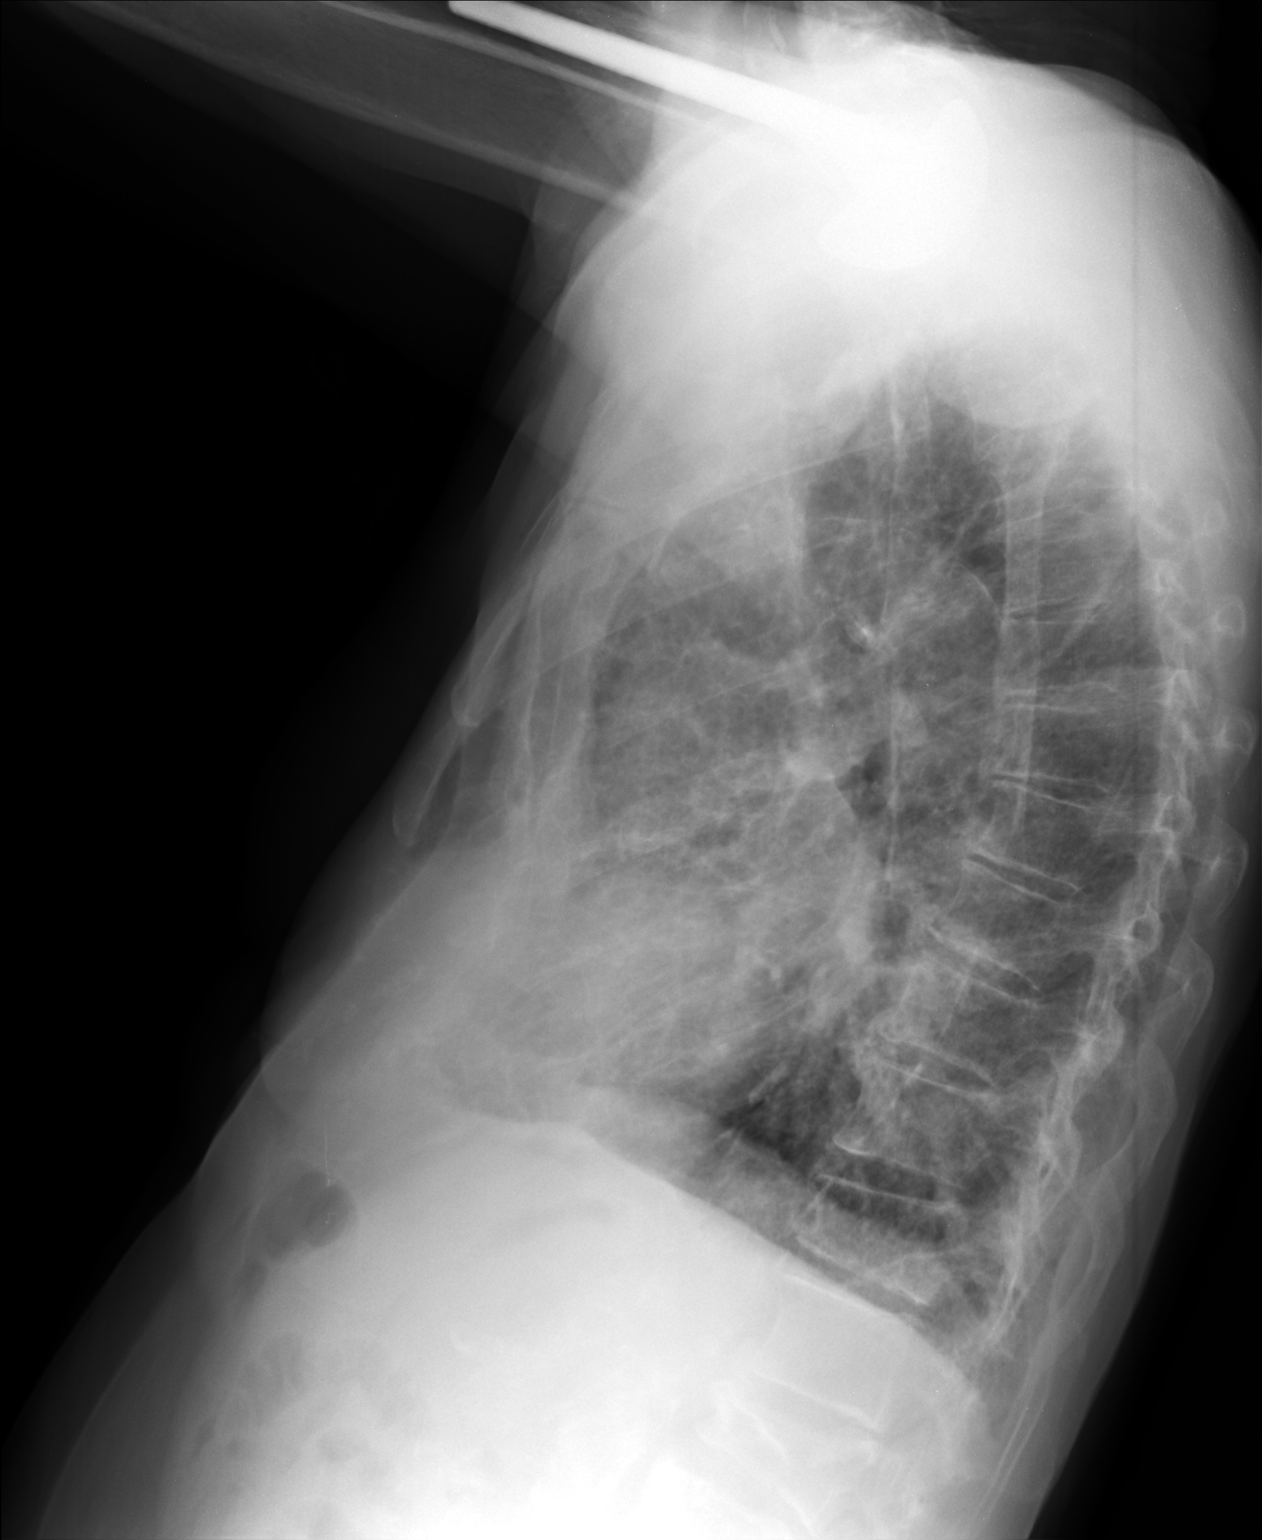

[2 of 2 positions shown; findings below may reference images not displayed]

FINDINGS: Exam limited due to motion artifact. Enlarged cardiac and
mediastinal contours with tortuosity of the thoracic aorta.
Right-greater-than-left mid and lower lung coarse interstitial
pulmonary opacities. Biapical pleural parenchymal thickening. No
definite pleural effusion or pneumothorax. Mid thoracic spine
degenerative changes. Age-indeterminate wedge compression
deformities of multiple lower thoracic vertebral bodies. Right
glenohumeral joint degenerative changes. Left shoulder arthroplasty.
IMPRESSION: Bilateral mid and lower lung, right-greater-than-left, coarse
interstitial opacities may represent combination of chronic changes
versus acute superimposed infectious process, particularly in the
absence of priors for comparison.

Multiple age-indeterminate wedge compression deformities of lower
thoracic vertebral bodies. Recommend correlation for point
tenderness.

Recommend short-term followup radiograph in 3-4 weeks to assess for
interval change/resolution given the lack of prior comparisons.

These results will be called to the ordering clinician or
representative by the Radiologist Assistant, and communication
documented in the PACS or zVision Dashboard.
